# Patient Record
Sex: Male | Born: 1945 | Race: White | Hispanic: No | Smoking: Former smoker
Health system: Southern US, Community
[De-identification: ages and names within clinical notes are randomized; demographics above are authoritative.]

## PROBLEM LIST (undated history)

## (undated) DIAGNOSIS — I2109 ST elevation (STEMI) myocardial infarction involving other coronary artery of anterior wall: Secondary | ICD-10-CM

## (undated) DIAGNOSIS — T8859XA Other complications of anesthesia, initial encounter: Secondary | ICD-10-CM

## (undated) DIAGNOSIS — E785 Hyperlipidemia, unspecified: Secondary | ICD-10-CM

## (undated) DIAGNOSIS — R0602 Shortness of breath: Secondary | ICD-10-CM

## (undated) DIAGNOSIS — I2510 Atherosclerotic heart disease of native coronary artery without angina pectoris: Secondary | ICD-10-CM

## (undated) DIAGNOSIS — N182 Chronic kidney disease, stage 2 (mild): Secondary | ICD-10-CM

## (undated) DIAGNOSIS — G4734 Idiopathic sleep related nonobstructive alveolar hypoventilation: Secondary | ICD-10-CM

## (undated) DIAGNOSIS — F419 Anxiety disorder, unspecified: Secondary | ICD-10-CM

## (undated) DIAGNOSIS — I219 Acute myocardial infarction, unspecified: Secondary | ICD-10-CM

## (undated) DIAGNOSIS — I209 Angina pectoris, unspecified: Secondary | ICD-10-CM

## (undated) DIAGNOSIS — I639 Cerebral infarction, unspecified: Secondary | ICD-10-CM

## (undated) DIAGNOSIS — I213 ST elevation (STEMI) myocardial infarction of unspecified site: Secondary | ICD-10-CM

## (undated) DIAGNOSIS — D649 Anemia, unspecified: Secondary | ICD-10-CM

## (undated) DIAGNOSIS — J449 Chronic obstructive pulmonary disease, unspecified: Secondary | ICD-10-CM

## (undated) DIAGNOSIS — I5022 Chronic systolic (congestive) heart failure: Secondary | ICD-10-CM

## (undated) DIAGNOSIS — K279 Peptic ulcer, site unspecified, unspecified as acute or chronic, without hemorrhage or perforation: Secondary | ICD-10-CM

## (undated) DIAGNOSIS — C44310 Basal cell carcinoma of skin of unspecified parts of face: Secondary | ICD-10-CM

## (undated) DIAGNOSIS — I00 Rheumatic fever without heart involvement: Secondary | ICD-10-CM

## (undated) DIAGNOSIS — F329 Major depressive disorder, single episode, unspecified: Secondary | ICD-10-CM

## (undated) DIAGNOSIS — I4891 Unspecified atrial fibrillation: Secondary | ICD-10-CM

## (undated) DIAGNOSIS — I255 Ischemic cardiomyopathy: Secondary | ICD-10-CM

## (undated) DIAGNOSIS — T4145XA Adverse effect of unspecified anesthetic, initial encounter: Secondary | ICD-10-CM

## (undated) DIAGNOSIS — M1990 Unspecified osteoarthritis, unspecified site: Secondary | ICD-10-CM

## (undated) DIAGNOSIS — I739 Peripheral vascular disease, unspecified: Secondary | ICD-10-CM

## (undated) DIAGNOSIS — J189 Pneumonia, unspecified organism: Secondary | ICD-10-CM

## (undated) DIAGNOSIS — F32A Depression, unspecified: Secondary | ICD-10-CM

## (undated) DIAGNOSIS — I10 Essential (primary) hypertension: Secondary | ICD-10-CM

## (undated) DIAGNOSIS — G43909 Migraine, unspecified, not intractable, without status migrainosus: Secondary | ICD-10-CM

## (undated) DIAGNOSIS — I214 Non-ST elevation (NSTEMI) myocardial infarction: Secondary | ICD-10-CM

## (undated) DIAGNOSIS — I998 Other disorder of circulatory system: Secondary | ICD-10-CM

## (undated) HISTORY — DX: Peripheral vascular disease, unspecified: I73.9

## (undated) HISTORY — DX: Essential (primary) hypertension: I10

## (undated) HISTORY — DX: Depression, unspecified: F32.A

## (undated) HISTORY — DX: Acute myocardial infarction, unspecified: I21.9

## (undated) HISTORY — DX: Chronic kidney disease, stage 2 (mild): N18.2

## (undated) HISTORY — PX: APPENDECTOMY: SHX54

## (undated) HISTORY — DX: Other disorder of circulatory system: I99.8

## (undated) HISTORY — DX: Hyperlipidemia, unspecified: E78.5

## (undated) HISTORY — PX: FEMORAL-POPLITEAL BYPASS GRAFT: SHX937

## (undated) HISTORY — PX: LOWER EXTREMITY ANGIOGRAM: SHX5955

## (undated) HISTORY — PX: CATARACT EXTRACTION W/ INTRAOCULAR LENS  IMPLANT, BILATERAL: SHX1307

## (undated) HISTORY — DX: Ischemic cardiomyopathy: I25.5

## (undated) HISTORY — DX: Major depressive disorder, single episode, unspecified: F32.9

---
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
## 1969-06-08 HISTORY — PX: MANDIBLE RECONSTRUCTION: SHX431

## 1978-10-08 HISTORY — PX: WRIST FRACTURE SURGERY: SHX121

## 1987-10-09 DIAGNOSIS — I2109 ST elevation (STEMI) myocardial infarction involving other coronary artery of anterior wall: Secondary | ICD-10-CM

## 1987-10-09 HISTORY — DX: ST elevation (STEMI) myocardial infarction involving other coronary artery of anterior wall: I21.09

## 1996-10-08 HISTORY — PX: CORONARY ARTERY BYPASS GRAFT: SHX141

## 1998-01-18 ENCOUNTER — Other Ambulatory Visit: Admission: RE | Admit: 1998-01-18 | Discharge: 1998-01-18 | Payer: Self-pay | Admitting: Cardiology

## 1998-03-08 ENCOUNTER — Other Ambulatory Visit: Admission: RE | Admit: 1998-03-08 | Discharge: 1998-03-08 | Payer: Self-pay | Admitting: Cardiology

## 2003-05-02 ENCOUNTER — Inpatient Hospital Stay (HOSPITAL_COMMUNITY): Admission: EM | Admit: 2003-05-02 | Discharge: 2003-05-05 | Payer: Self-pay

## 2003-05-02 ENCOUNTER — Encounter: Payer: Self-pay | Admitting: Emergency Medicine

## 2003-06-02 ENCOUNTER — Ambulatory Visit (HOSPITAL_COMMUNITY): Admission: RE | Admit: 2003-06-02 | Discharge: 2003-06-02 | Payer: Self-pay | Admitting: Cardiology

## 2003-07-06 ENCOUNTER — Encounter: Payer: Self-pay | Admitting: Vascular Surgery

## 2003-07-08 ENCOUNTER — Ambulatory Visit (HOSPITAL_COMMUNITY): Admission: RE | Admit: 2003-07-08 | Discharge: 2003-07-09 | Payer: Self-pay | Admitting: Vascular Surgery

## 2007-10-09 DIAGNOSIS — I639 Cerebral infarction, unspecified: Secondary | ICD-10-CM

## 2007-10-09 DIAGNOSIS — I998 Other disorder of circulatory system: Secondary | ICD-10-CM

## 2007-10-09 HISTORY — DX: Cerebral infarction, unspecified: I63.9

## 2007-10-09 HISTORY — PX: OTHER SURGICAL HISTORY: SHX169

## 2007-10-09 HISTORY — DX: Other disorder of circulatory system: I99.8

## 2007-10-14 ENCOUNTER — Ambulatory Visit: Payer: Self-pay | Admitting: Vascular Surgery

## 2007-10-21 ENCOUNTER — Ambulatory Visit: Payer: Self-pay | Admitting: Surgery

## 2007-10-21 ENCOUNTER — Ambulatory Visit (HOSPITAL_COMMUNITY): Admission: RE | Admit: 2007-10-21 | Discharge: 2007-10-22 | Payer: Self-pay | Admitting: Surgery

## 2007-11-04 ENCOUNTER — Ambulatory Visit: Payer: Self-pay | Admitting: Vascular Surgery

## 2007-11-14 ENCOUNTER — Inpatient Hospital Stay (HOSPITAL_COMMUNITY): Admission: RE | Admit: 2007-11-14 | Discharge: 2007-11-17 | Payer: Self-pay | Admitting: Vascular Surgery

## 2007-11-14 ENCOUNTER — Encounter: Payer: Self-pay | Admitting: Vascular Surgery

## 2007-11-25 ENCOUNTER — Ambulatory Visit: Payer: Self-pay | Admitting: Vascular Surgery

## 2007-11-28 ENCOUNTER — Ambulatory Visit: Payer: Self-pay | Admitting: Vascular Surgery

## 2007-12-02 ENCOUNTER — Ambulatory Visit: Payer: Self-pay | Admitting: Vascular Surgery

## 2007-12-02 ENCOUNTER — Inpatient Hospital Stay (HOSPITAL_COMMUNITY): Admission: AD | Admit: 2007-12-02 | Discharge: 2007-12-18 | Payer: Self-pay | Admitting: Vascular Surgery

## 2007-12-29 ENCOUNTER — Ambulatory Visit: Payer: Self-pay | Admitting: Vascular Surgery

## 2008-01-26 ENCOUNTER — Ambulatory Visit: Payer: Self-pay | Admitting: Vascular Surgery

## 2008-03-03 ENCOUNTER — Ambulatory Visit: Payer: Self-pay | Admitting: Vascular Surgery

## 2008-03-09 ENCOUNTER — Ambulatory Visit: Payer: Self-pay | Admitting: Vascular Surgery

## 2008-06-08 HISTORY — PX: CAROTID ENDARTERECTOMY: SUR193

## 2008-06-29 ENCOUNTER — Ambulatory Visit: Payer: Self-pay | Admitting: Vascular Surgery

## 2008-07-05 ENCOUNTER — Ambulatory Visit: Payer: Self-pay | Admitting: Vascular Surgery

## 2008-07-07 ENCOUNTER — Encounter: Payer: Self-pay | Admitting: Vascular Surgery

## 2008-07-07 ENCOUNTER — Ambulatory Visit: Payer: Self-pay | Admitting: Vascular Surgery

## 2008-07-07 ENCOUNTER — Inpatient Hospital Stay (HOSPITAL_COMMUNITY): Admission: RE | Admit: 2008-07-07 | Discharge: 2008-07-08 | Payer: Self-pay | Admitting: Vascular Surgery

## 2008-07-27 ENCOUNTER — Ambulatory Visit: Payer: Self-pay | Admitting: Vascular Surgery

## 2008-12-14 ENCOUNTER — Encounter: Payer: Self-pay | Admitting: Cardiology

## 2008-12-14 HISTORY — PX: CARDIOVASCULAR STRESS TEST: SHX262

## 2009-06-21 ENCOUNTER — Ambulatory Visit: Payer: Self-pay | Admitting: Vascular Surgery

## 2009-10-08 DIAGNOSIS — C44310 Basal cell carcinoma of skin of unspecified parts of face: Secondary | ICD-10-CM

## 2009-10-08 HISTORY — DX: Basal cell carcinoma of skin of unspecified parts of face: C44.310

## 2009-12-25 IMAGING — CR DG CHEST 2V
2 series · 2 of 2 positions shown · non-contrast
Comparison: None.

CLINICAL DATA: preop evaluation for arteriography.

[view not recorded (1 of 2)]
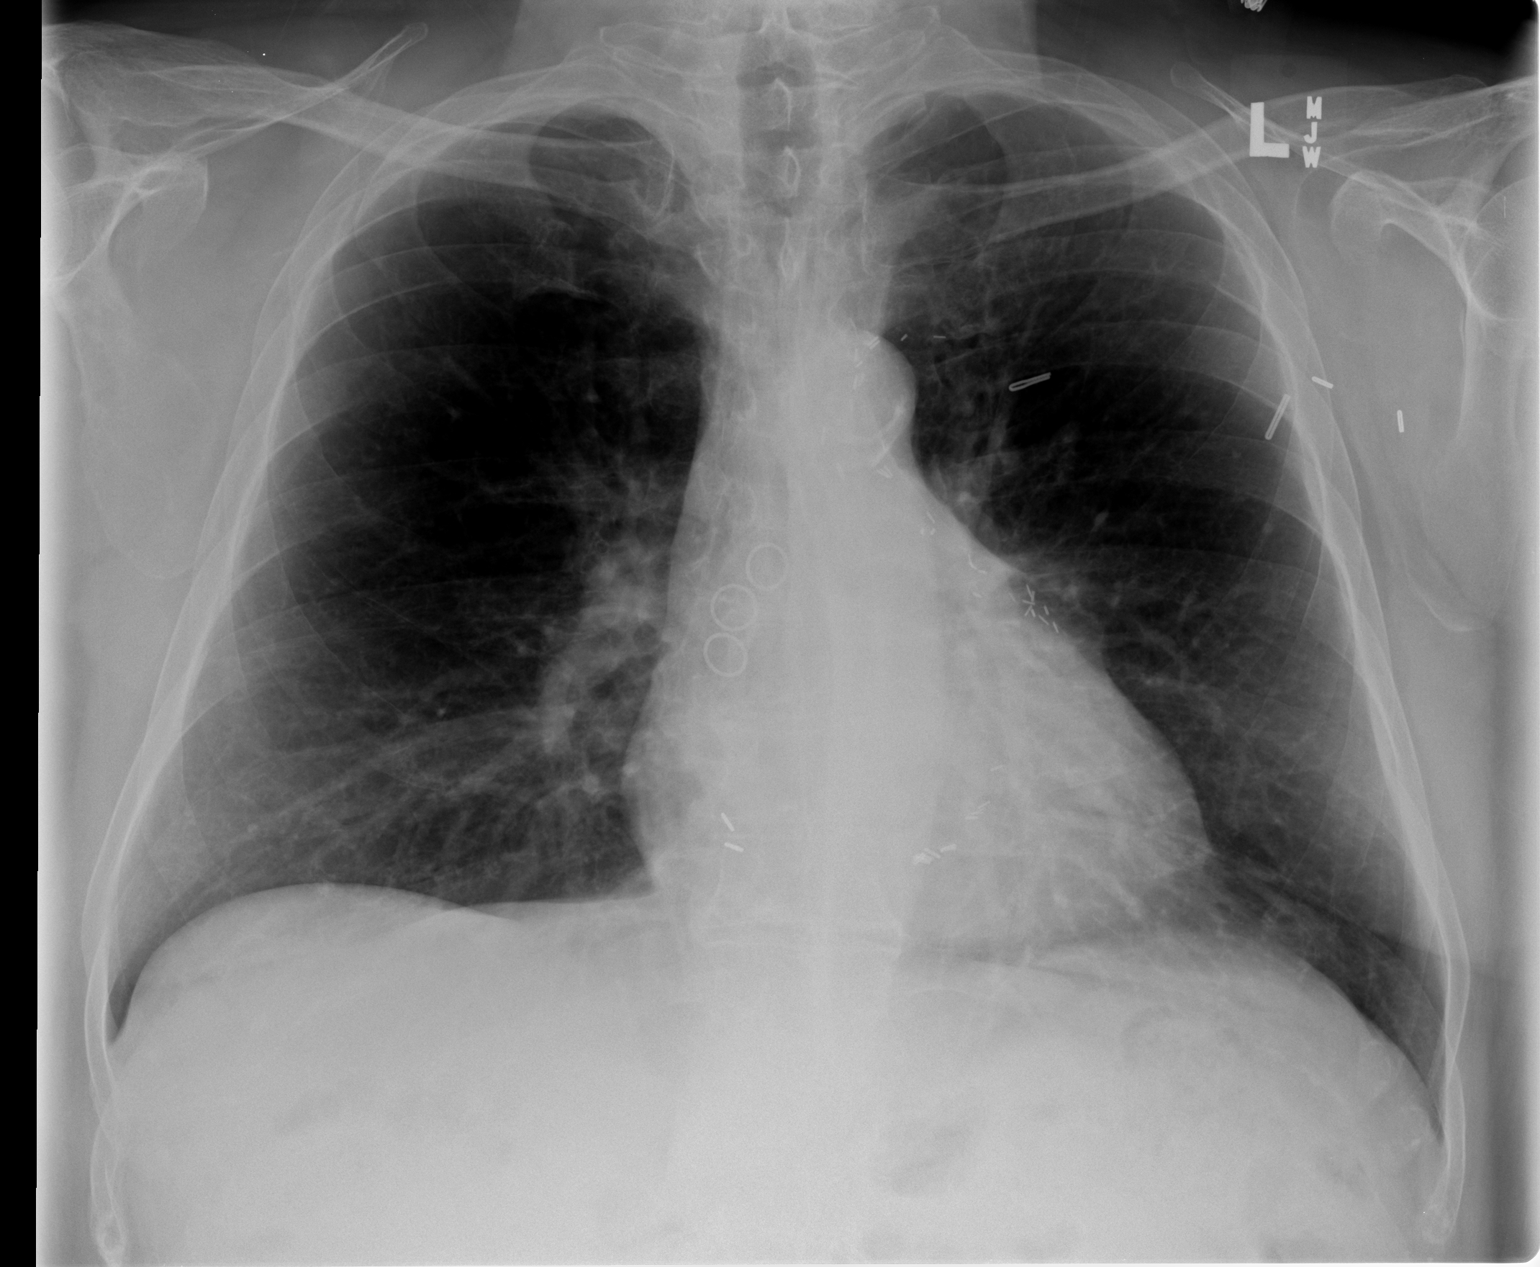

[view not recorded (2 of 2)]
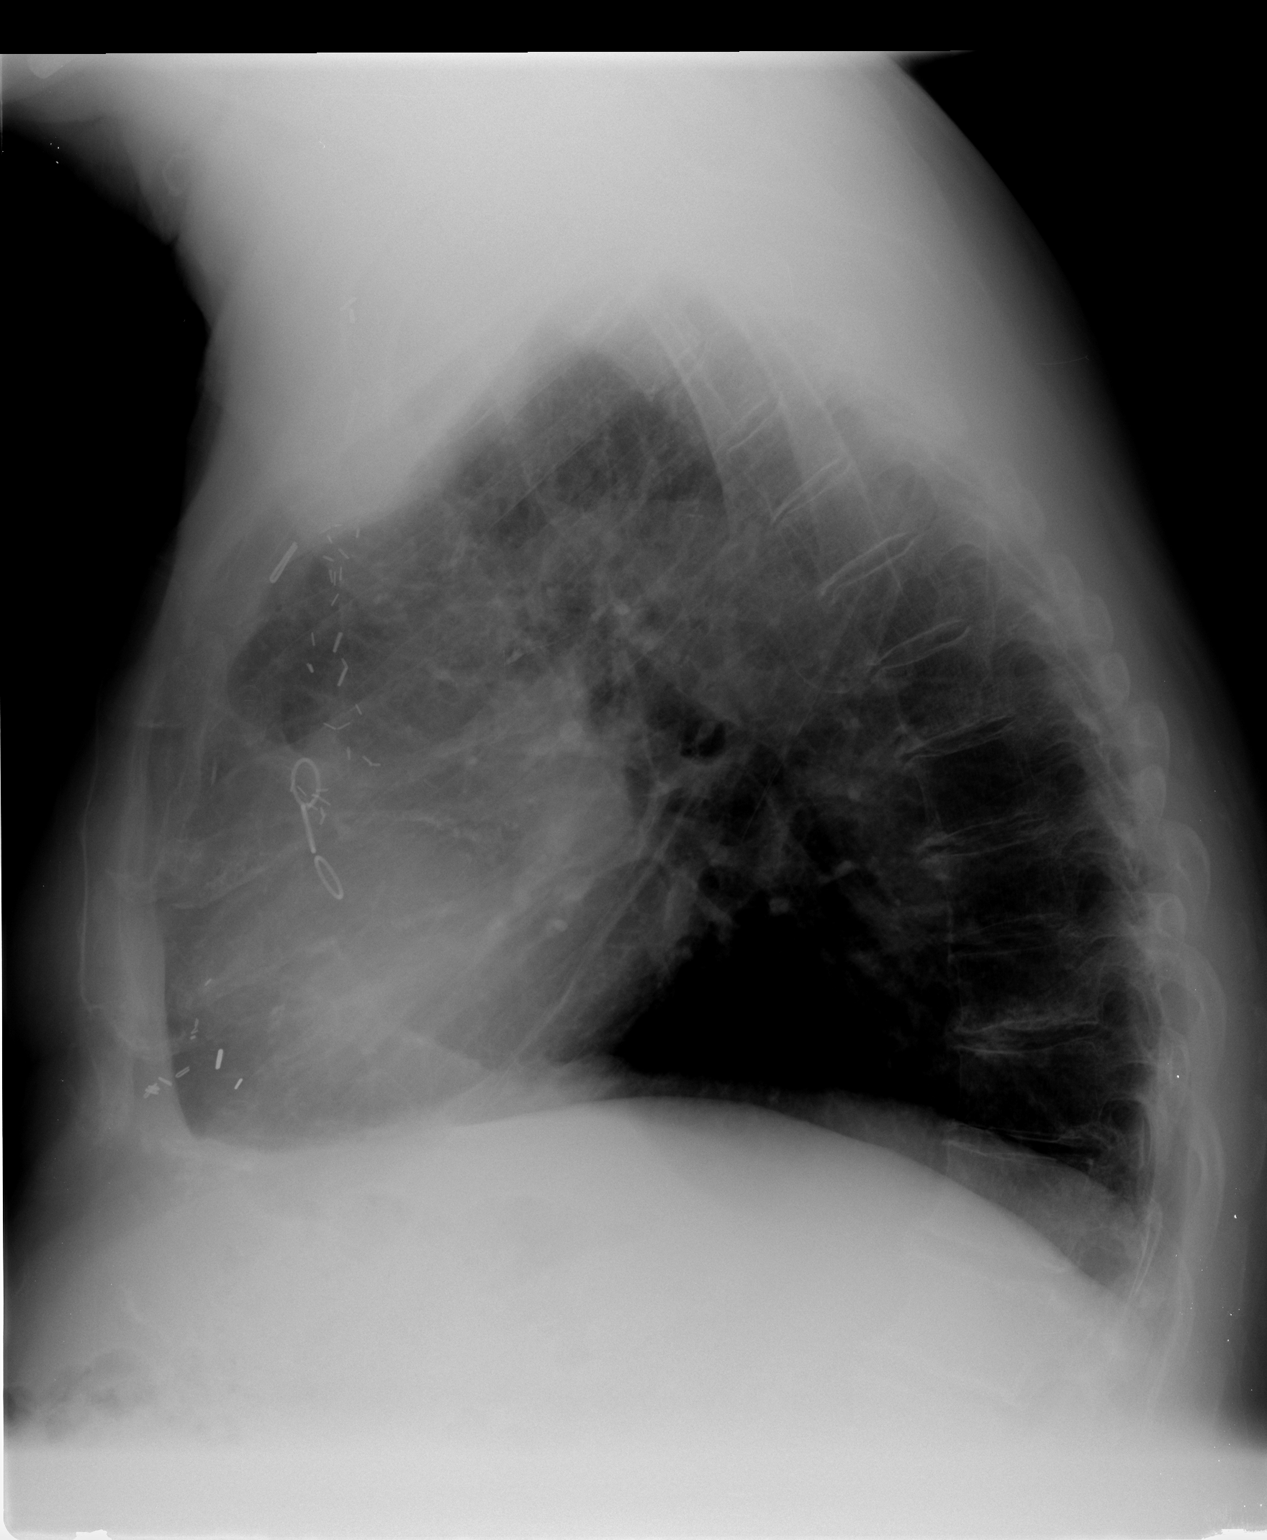

[2 of 2 positions shown; findings below may reference images not displayed]

CHEST - 2 VIEW:

Cardiopericardial silhouette is within normal limits for size. Patient status
post CABG. There is no focal airspace consolidation, pulmonary edema, or pleural
effusion. Bones are diffusely demineralized. Surgical clips are seen in the left
axilla.
IMPRESSION: No acute cardiopulmonary process

## 2010-06-05 ENCOUNTER — Ambulatory Visit: Payer: Self-pay | Admitting: Cardiology

## 2010-12-06 ENCOUNTER — Ambulatory Visit (INDEPENDENT_AMBULATORY_CARE_PROVIDER_SITE_OTHER): Payer: Medicare Other | Admitting: Cardiology

## 2010-12-06 DIAGNOSIS — F17200 Nicotine dependence, unspecified, uncomplicated: Secondary | ICD-10-CM

## 2010-12-06 DIAGNOSIS — I2510 Atherosclerotic heart disease of native coronary artery without angina pectoris: Secondary | ICD-10-CM

## 2011-02-15 ENCOUNTER — Other Ambulatory Visit: Payer: Self-pay | Admitting: Cardiology

## 2011-02-15 DIAGNOSIS — I10 Essential (primary) hypertension: Secondary | ICD-10-CM

## 2011-02-15 NOTE — Telephone Encounter (Signed)
Faxed refill for HCTZ 25mg . To [facility]

## 2011-02-20 NOTE — Assessment & Plan Note (Signed)
OFFICE VISIT   [name], [name]  DOB:  12-Apr-1946                                       01/26/2008  ZOXWR#:[id number]   Patient's right inguinal region is granulating in nicely.  The VAC was  removed.  There are some Granulex crystals, which they have been  applying to the surface of the wound.  The wound today measures 2.8 x 4  in its maximum dimensions, but it is filled in the depth of the wound  and is now a flat wound which just requires granulation tissue to  continue.   Will have moist-to-dry dressing changes at home.  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  Return to see me in six weeks unless he has any  evidence of infection in the interim.   Quita Skye Hart Rochester, M.[name].  Electronically Signed   JDL/MEDQ  [name]:  01/26/2008  T:  01/26/2008  Job:  [id number]

## 2011-02-20 NOTE — Assessment & Plan Note (Signed)
OFFICE VISIT   [name], [name]  DOB:  [birth year]                                       06/21/2009  NWGNF#:[id number]   The patient returns today now 1 year post right carotid endarterectomy.  He has had no neurologic symptoms since his surgery although he had a  remote history of amaurosis fugax in the right eye preoperatively.  He  has had no hemiparesis, aphasia, amaurosis fugax, diplopia, blurred  vision or syncope.  He has stable claudication in the left calf which is  not limiting him.  He has had no rest pain or history of infection or  nonhealing ulcers.  He is able to ambulate as much as he needs.  He  takes one aspirin per day.   PHYSICAL EXAM:  Blood pressure 180/77, heart rate 66, respirations 14.  His carotid pulses are 3+, soft bruit on the left, no bruit on the  right.  Neurological:  Normal.  Chest:  Clear to auscultation.  Abdomen:  Obese.  No palpable masses.  He has 3+ femoral and popliteal pulse on  the right and a 3+ femoral and absent distal pulses on the left.   ABI is 0.47 on the left, 1.0 on the right and his carotid duplex exam  today reveals no evidence of restenosis in the right internal carotid  and mild narrowing of the left internal carotid approximating 40-50%.  I  reassured him regarding these findings and will continue to follow him  on a carotid and fem-pop protocol in the future unless he develops any  new symptoms.   Quita Skye Hart Rochester, M.D.  Electronically Signed   JDL/MEDQ  D:  06/21/2009  T:  06/22/2009  Job:  [id number]

## 2011-02-20 NOTE — Procedures (Signed)
BYPASS GRAFT EVALUATION   INDICATION:  Followup evaluation of lower extremity bypass graft.  Left  leg claudication.   HISTORY:  Diabetes:  No.  Cardiac:  No.  Hypertension:  Yes.  Smoking:  Quit earlier this year.  Previous Surgery:  On 11/14/2007 the patient had a right external iliac,  common femoral artery, profundo-femoral artery endarterectomy with  Dacron patch angioplasty and a right fem-pop bypass graft with Gore-Tex  by Dr. Hart Rochester.   SINGLE LEVEL ARTERIAL EXAM                               RIGHT              LEFT  Brachial:                    174                173  Anterior tibial:             142                53  Posterior tibial:            149                32  Peroneal:  Ankle/brachial index:        0.86               0.30   PREVIOUS ABI:  Date:  10/14/2007 (preop)  RIGHT:  0.21  LEFT:  0.39   LOWER EXTREMITY BYPASS GRAFT DUPLEX EXAM:   DUPLEX:  Doppler arterial waveforms are triphasic proximal to, within  and distal to the right fem-pop bypass graft with no evidence of  stenosis in the graft.   IMPRESSION:  1. Right ABI is higher than previously recorded.  2. Left ABI is stable from previous study.  3. Patent right fem-pop bypass graft.   ___________________________________________  Quita Skye. Hart Rochester, M.D.   MC/MEDQ  D:  03/03/2008  T:  03/03/2008  Job:  [id number]

## 2011-02-20 NOTE — Op Note (Signed)
NAMEJORJE, VANATTA                ACCOUNT NO.:  [id number]   MEDICAL RECORD NO.:  [id number]          PATIENT TYPE:  AMB   LOCATION:  SDS                          FACILITY:  [facility]   PHYSICIAN:  VDurene Cal IV, MDDATE OF BIRTH:  08-11-1946   DATE OF PROCEDURE:  10/21/2007  DATE OF DISCHARGE:                               OPERATIVE REPORT   PREOPERATIVE DIAGNOSIS:  Bilateral severe claudication/rest pain, right  greater than left.   PROCEDURES PERFORMED:  1. Ultrasound-guided left common femoral artery access.  2. Abdominal aortogram.  3. Bilateral lower extremity runoff.   INDICATIONS:  This is a 65 year old gentleman with a history of  percutaneous intervention in the past who has had worsening of his  symptoms.  He comes in today for arteriogram.  Risks and benefits were  discussed.  Informed consent was signed.   The left common femoral artery was evaluated with ultrasound and found  to be widely patent.  A 1% lidocaine was used for local anesthesia.  Using ultrasound, the left common femoral artery was accessed with an 18-  gauge needle.  A 0.035 wire was advanced in a retrograde fashion to the  abdominal aorta under fluoroscopic visualization.  A 5-French sheath was  then placed.  An OmniFlush catheter was advanced over the wire, placed  at the level of L1 and an abdominal aortogram was obtained.  Next, the  catheter was pulled down to the aortic bifurcation and pelvic angiogram  was obtained followed by bilateral lower extremity runoff.   FINDINGS:  Aortogram:  The visualized portions of the suprarenal  abdominal aorta showed minimal disease.  There are 2 renal arteries  bilaterally with no evidence of renal artery stenosis.  The infrarenal  abdominal aorta is heavily calcified.  Stents were seen within the right  and left common iliac artery at the origin and found to be widely  patent.  The left external iliac artery is widely patent.  The right  common and  external iliac arteries are widely patent.  The left common  and external iliac artery are widely patent.   Right lower extremity:  There is diffuse disease within the common  femoral artery with an occlusion at its distal extent.  There is diffuse  disease within the right superficial femoral artery with total  occlusion.  There is reconstitution above the popliteal artery, which is  heavily calcified.  There is a high-grade stenosis in the popliteal  artery behind the knee.  The popliteal artery is patent, and there is a  3-vessel runoff down to the ankle.   Left lower extremity:  The left common femoral artery is heavily  diseased.  There is occlusion of the profunda femoral artery at its  origin.  This reconstitutes via collaterals.  The left superficial  femoral is occluded with reconstitution at the adductor canal.  The  popliteal artery is patent.  There is 3-vessel runoff.   After the above images were obtained, a decision was made to terminate  the procedure.  The patient tolerated the procedure well.  There were no  complications.  IMPRESSION:  1. Widely patent kissing iliac stents.  2. Widely patent left external iliac stent.  No evidence of inflow      disease.  3. Diffuse right common femoral disease with occlusion at its distal      extent.  There is reconstitution of the      superficial femoral artery above the knee with an area of stenosis      at the level of the patella.  4. Occlusion of the left profunda femoral artery and diffuse disease      within the left common femoral artery.  There is also left      superficial femoral artery occlusion with reconstitution at the      adductor canal.           ______________________________  V. Charlena Cross, MD  Electronically Signed     VWB/MEDQ  D:  10/21/2007  T:  10/21/2007  Job:  [id number]

## 2011-02-20 NOTE — H&P (Signed)
HISTORY AND PHYSICAL EXAMINATION   December 02, 2007   Re:  [name], [name]                DOB:  06-18-1946   CHIEF COMPLAINT:  Nonhealing right groin wound post right lower  extremity revascularization.   HISTORY OF PRESENT ILLNESS:  This patient underwent extensive right  femoral endarterectomy and right femoral popliteal bypass grafting with  6 mm Gore-Tex by Dr. Hart Rochester on February 6th of this year for a severely  ischemic right leg.  He had diffuse severe calcific disease throughout  his right femoral vessels and ended up with a Dacron patch angioplasty  and profunda femoris endarterectomy as well as his femoral popliteal  graft.  He developed some skin necrosis and has been followed in the  office for the last week, being seen on three occasions with some  superficial debridement being performed today and some subcutaneous fat  necrosis debrided.  It was decided to admit him for local wound care and  IV antibiotics.   PAST MEDICAL HISTORY:  1. Hypertension.  2. Coronary artery disease post coronary artery bypass grafting by Dr.      Donata Clay in 1988.  3. Myocardial infarction, 1989.  4. Negative for COPD, stroke or diabetes.   PREVIOUS SURGERY:  1. Coronary artery bypass grafting.  2. Parasternal wound infection.  3. Ligation AV fistula right profunda femoris artery and vein with      suture repair by Dr. Hart Rochester 1989.  4. Right femoral endarterectomy with Dacron patch.  5. Right femoral popliteal bypass grafting by Dr. Hart Rochester this year in      February, 2009.   SOCIAL HISTORY:  1. Patient is single.  2. Has 3 children.  3. Is retired.  4. Smokes a pack of cigarettes per day.  5. Drinks a moderate amount of alcohol.   ALLERGIES:  None known.   MEDICATIONS:  1. Hydrochlorothiazide 25 mg one daily.  2. Enalapril 20 mg one daily.  3. Metoprolol 100 mg one daily.  4. Amlodipine 10 mg one daily.  5. Simvastatin 20 mg one daily.  6. Citalopram 20  mg one daily.  7. Aspirin one tablet daily.   PHYSICAL EXAM:  Blood pressure is 118/64, heart rate 60, respirations  14.  General:  This is a healthy-appearing male in no apparent distress,  alert and oriented x3.  Neck:  Supple, 3+ carotid pulses are palpable.  No bruits are audible.  Neurologic Exam:  Normal.  No palpable  adenopathy in the neck.  Chest:  Clear to auscultation.  Cardiovascular  Exam:  This is a regular rhythm, no murmurs.  Abdomen:  Soft, nontender  with no masses.  Extremity Exam:  Reveals the right groin wound to be  debrided and open, about 2 cm in depth with some fat necrosis.  The deep  layered closure is still intact and there is no evidence of any purulent  drainage from the depth of the wound.  There is some mild erythema  surrounding this.  Right leg has a 3+ popliteal graft pulse and well  perfused right foot.  Left leg has 3+ femoral pulse, no distal pulses.   IMPRESSION:  Nonhealing wound right groin from recent right lower  extremity revascularization.   PLAN:  Admit the patient for local wound care and IV antibiotics.   Quita Skye Hart Rochester, M.D.  Electronically Signed   JDL/MEDQ  D:  12/02/2007  T:  12/04/2007  Job:  [id number] 

## 2011-02-20 NOTE — Procedures (Signed)
BYPASS GRAFT EVALUATION   INDICATION:  Follow up right fem-pop bypass graft.   HISTORY:  Diabetes:  No.  Cardiac:  Yes, CABG.  Hypertension:  Yes.  Smoking:  Yes.  Previous Surgery:  Please see above.   SINGLE LEVEL ARTERIAL EXAM                               RIGHT              LEFT  Brachial:                    149                146  Anterior tibial:             150                42  Posterior tibial:            140                63  Peroneal:  Ankle/brachial index:        1.01               0.42   PREVIOUS ABI:  Date: 03/03/08  RIGHT:  0.86  LEFT:  0.30   LOWER EXTREMITY BYPASS GRAFT DUPLEX EXAM:   DUPLEX:  Patent right fem-pop bypass graft with no evidence of focal  stenosis.   IMPRESSION:  1. Patent right femoropopliteal bypass graft with no evidence of focal      stenosis.  2. Normal ankle brachial index with biphasic Doppler waveform noted in      the right leg, status post right femoropopliteal bypass graft.  3. Moderately abnormal ankle brachial index with monophasic Doppler      waveform noted in the left leg.   ___________________________________________  Quita Skye. Hart Rochester, M.D.   MG/MEDQ  D:  06/29/2008  T:  06/29/2008  Job:  [id number]

## 2011-02-20 NOTE — H&P (Signed)
HISTORY AND PHYSICAL EXAMINATION   November 04, 2007   Re:  [name], [name].               DOB:  12-22-1945   CHIEF COMPLAINT:  Severe bilateral lower extremity claudication right  worse than left secondary to common femoral and femoral popliteal  occlusive disease.   HISTORY OF PRESENT ILLNESS:  This patient was referred for evaluation of  lower extremity occlusive disease by Dr. Delfin Edis having previously  undergone bilateral iliac interventions by Dr. Hart Rochester in 2004 which  included bilateral common iliac artery stenting proximally as well as a  distal left common iliac artery stent.  He had a good result initially  and was noted at that time to have severe calcific disease involving his  superficial femoral arteries.  Recently he has developed severe  worsening of his symptoms with essentially rest pain in the right leg  and is unable to walk more than about 50-75 feet at which time both legs  are quite symptomatic.  His right leg is certainly worse.  He has no  history of infection, gangrene or nonhealing ulcers.  He did have one  episode of amaurosis fugax in the right eye 3 years ago but has not had  any recurrence.  Angiography revealed his aortoiliac system to be patent  with his stents being widely patent but severe common femoral occlusive  disease bilaterally as well as superficial femoral occlusions and he is  now being evaluated for a femoral popliteal bypass graft initially on  the right side.   PAST MEDICAL HISTORY:  1. Hypertension.  2. Coronary artery disease status post coronary artery bypass grafting      Dr. Donata Clay in 1988.  3. Myocardial infarction 1989.  4. Negative for COPD, stroke or diabetes.   PAST SURGICAL HISTORY:  1. Coronary artery bypass grafting.  2. Repair of sternal wound infection.  3. Ligation of an AV fistula of the right profunda femoris artery and      vein with primary suture repair by Dr. Hart Rochester 1989.   SOCIAL  HISTORY:  The patient is single, has 3 children and is retired.  Smokes a pack of cigarettes per day and drinks moderate amount of  alcohol.   ALLERGIES:  None known.   MEDICATIONS:  1. Hydrochlorothiazide 25 mg one daily.  2. Enalapril 20 mg one daily.  3. Metoprolol 100 mg one daily.  4. Amlodipine 10 mg one daily.  5. Simvastatin 20 mg one daily.  6. Citalopram 20 mg one daily.  7. Potassium 99 mEq one daily.  8. Aspirin one tablet one daily.   PHYSICAL EXAMINATION:  Vital signs:  Blood pressure is 150/80, heart  rate 65, respirations 14.  General:  He is a healthy-appearing male in  no apparent distress.  Alert and oriented x3.  Neck:  Supple.  3+  carotid pulses.  There is a soft bruit on the right.  Neurological:  Normal.  No palpable adenopathy in the neck.  Chest:  Clear to  auscultation.  Cardiovascular:  Exam reveals a regular rhythm with no  murmurs.  Abdomen:  Soft, nontender with no masses.  Extremities:  Exam  reveals the right leg to have a 2+ femoral pulse, absent distal pulses.  Left leg has a 1-2+ femoral pulse, absent distal pulses.  Both feet have  some dependent rubor with no active ulceration or infection.  ABI is  0.21 on the right and 0.39 on the  left.   I reviewed his angiogram done by Dr. Myra Gianotti which reveals his  aortoiliac system to be patent although diffusely diseased.  There is  total occlusion of his right common femoral artery and right superficial  femoral artery with reconstitution of popliteal artery distal leg with a  below knee popliteal artery on the right being relatively good.  On the  left leg there is occlusion of the profunda femoris artery and the  superficial femoral artery with distal reconstitution of the above knee  popliteal artery.   IMPRESSION:  Rest pain right leg secondary to severe lower extremity  occlusive disease.   PLAN:  Admit the patient on February 6 for right femoral endarterectomy,  right femoral popliteal  bypass grafting with Gore-Tex since saphenous  vein has been removed in the past.  Dr. Delfin Edis has recently  evaluated the patient and feels that his cardiac status is quite stable  and adequate at this time for lower extremity revascularization.   Quita Skye Hart Rochester, M.D.  Electronically Signed   JDL/MEDQ  D:  11/04/2007  T:  11/05/2007  Job:  [id number]   cc:   Colleen Can. Deborah Chalk, M.D.

## 2011-02-20 NOTE — Assessment & Plan Note (Signed)
OFFICE VISIT   VAHE, PIENTA  DOB:  February 04, 1946                                       03/09/2008  ZOXWR#:[id number]   The patient is now almost 4 months post extensive right external iliac,  common femoral endarterectomy with Dacron patch angioplasty in the right  femoral below knee popliteal bypass of 6 mm Gore-Tex graft.  The right  inguinal wound has completely healed with no evidence of any residual  fat necrosis or infection.  He has no symptoms of ischemia in the right  leg with no claudication.  He continues to have severe claudication  symptoms in the left calf which limit him to walking less than 1 block.  He has no rest pain or history of nonhealing ulcers on the left side.  He denies any hemispheric or nonhemispheric TIAs, amaurosis fugax,  diplopia, blurred vision or syncope.  He also is having no chest pain,  dyspnea on exertion, PND or orthopnea.   PHYSICAL EXAMINATION:  Vital signs:  On exam today blood pressure  144/70, heart rate 64.  Carotid pulses 3+ with a soft bruit on the left.  Neurological:  Normal.  Chest:  Clear to auscultation.  Abdomen:  Soft,  nontender with no masses.  He has 3+ femoral, popliteal and posterior  tibial and dorsalis pedis pulse in the right foot with a well-perfused  right foot which has 1+ edema.  Left leg has 2+ femoral pulse with no  distal pulses.   He does have extensive common femoral and profunda disease on the left  and a superficial femoral occlusion and needs a similar procedure on the  left side in the near future.  ABIs last week were 0.30 on the left and  0.86 on the right.  He would like to wait until the fall if at all  possible so he will return in 3 months with the following studies:  1)  carotid duplex exam which has not been done in 11 years, 2) ABIs, 3)  vein mapping left greater saphenous vein in preparation for left femoral  popliteal bypass graft.   Quita Skye Hart Rochester, M.D.  Electronically Signed   JDL/MEDQ  D:  03/09/2008  T:  03/10/2008  Job:  [id number]

## 2011-02-20 NOTE — Op Note (Signed)
NAMELEXTON, HIDALGO NO.:  [id number]   MEDICAL RECORD NO.:  [id number]          PATIENT TYPE:  INP   LOCATION:  3304                         FACILITY:  [facility]   PHYSICIAN:  Quita Skye. Hart Rochester, M.D.  DATE OF BIRTH:  Jul 28, 1946   DATE OF PROCEDURE:  11/14/2007  DATE OF DISCHARGE:                               OPERATIVE REPORT   PREOPERATIVE DIAGNOSIS:  Severely ischemic right leg secondary to severe  iliac, common femoral, superficial femoral and tibial occlusive disease.   POSTOPERATIVE DIAGNOSIS:  Severely ischemic right leg secondary to  severe iliac, common femoral, superficial femoral and tibial occlusive  disease.   OPERATION:  1. Extensive endarterectomy of right external iliac, common femoral      and profunda femoris arteries with Dacron patch angioplasty.  2. Right common femoral to popliteal (below-knee) bypass using a 6-mm      Gore-Tex (Propaten) bypass.  3. Intraoperative arteriogram.   SURGEON:  Quita Skye. Hart Rochester, M.D.   FIRST ASSISTANT:  Jerold Coombe, P.A.   ANESTHESIA:  General endotracheal.   PROCEDURE:  The patient was taken to the operating room and placed in  supine position, at which time satisfactory general endotracheal  anesthesia was administered.  Right leg was prepped with Betadine scrub  and solution, draped in a routine sterile manner.  A longitudinal  incision was made in the inguinal area, carried down through  subcutaneous tissue to the femoral vessels, which were severely and  diffusely calcified.  The common superficial and profunda femoris artery  were dissected free.  Superficial femoral artery was chronically  occluded.  The profunda had previously had a traumatic AV fistula  repaired and therefore had a lot of scar tissue and was calcified  heavily down to the tertiary branches, which were dissected free,  exposing the profunda for a distance of about 6-7 cm.  The common  femoral artery was diffusely calcified and  pulseless and the external  iliac was exposed well up beneath the inguinal ligament as far as  possible, where it did have eventually a pulse but was diffusely  diseased.  The popliteal artery was exposed through a medial incision  below the knee, where it was also a diseased vessel but did have a soft  area and was patent on angiogram.  A subfascial anatomic tunnel was  created and a 6-mm ringed Gore-Tex-Propaten graft was delivered through  the tunnel.  The patient was heparinized.  The popliteal artery was  occluded proximally and distally with vessel loops, opened with a 15  blade, extended with the Potts scissors.  It was a diffusely-diseased  vessel but was widely patent and had good backbleeding.  The Gore-Tex  was spatulated and anastomosed end-to-side with 6-0 Prolene.  Clamps  were then released.  Attention turned to the inguinal area, where the  external iliac artery was occluded as far proximally as possible with a  Henley clamp, profunda was occluded distally, and a longitudinal opening  made in the common femoral artery through this area of severely,  diffusely calcified disease.  The arteriotomy was extended well  up into  the external iliac artery and down through the common femoral into the  profunda for a distance of 5 or 6 cm down the profunda until it was  patent but did still have a posterior plaque.  A long endarterectomy was  then performed to remove this calcified plaque and it was carried out  proximally up to the clamp and there was good inflow present.  After  careful removal of all debris the superficial femoral artery was excised  from its origin, leaving a long area to patch, which was done with a  Dacron Finesse patch.  This was sewn on with 5-0 Prolene.  A site for  anastomosis of the Gore-Tex to the patch was selected . It was opened  with a 15 blade, extended with the Potts scissors, and the Gore-Tex  anastomosed end-to-side with 6-0 Prolene.  Clamps were  then released and  there was an excellent pulse and Doppler flow, not only in the profunda  but also in the below-knee popliteal artery and posterior tibial and  anterior tibial arteries at the ankle.  Intraoperative arteriogram  revealed a widely patent anastomosis to the below-knee popliteal artery  with three-vessel runoff.  Protamine was given to reverse the heparin.  Following adequate hemostasis, the wounds were irrigated with saline,  closed in layers with Vicryl in subcuticular fashion, sterile dressing  applied.  The patient taken to the recovery room in satisfactory  condition.      Quita Skye Hart Rochester, M.D.  Electronically Signed     JDL/MEDQ  D:  11/14/2007  T:  11/15/2007  Job:  [id number]

## 2011-02-20 NOTE — Discharge Summary (Signed)
[name], Dominic NO.:  [id number]   MEDICAL RECORD NO.:  [id number]          PATIENT TYPE:  INP   LOCATION:  2550                         FACILITY:  [facility]   PHYSICIAN:  Quita Skye. Hart Rochester, M.D.  DATE OF BIRTH:  [birth year]   DATE OF ADMISSION:  07/07/2008  DATE OF DISCHARGE:  07/08/2008                               DISCHARGE SUMMARY   DISCHARGE DIAGNOSES:  1. Right carotid occlusive disease.  2. Hypertension.  3. Coronary artery disease, status post coronary artery bypass      grafting.  4. Status post myocardial infarction, 1989.   PROCEDURES PERFORMED:  1. Resection of redundant right common carotid with primary      reanastomosis.  2. Right carotid endarterectomy utilizing 10-French shunt and Dacron      patch angioplasty closure both procedures by Dr. Hart Rochester.   COMPLICATIONS:  None.   DISCHARGE MEDICATIONS:  He is instructed to resume all preoperative  medications consisting of.  1. Hydrochlorothiazide 25 mg p.o. daily.  2. Citalopram 20 mg p.o. daily.  3. Enalapril 20 mg p.o. b.i.d.  4. Simvastatin 20 mg p.o. daily.  5. Aspirin 325 mg p.o. daily.  6. Potassium 99 mg p.o. q.a.m.  7. Norvasc 10 mg p.o. daily.  8. Metoprolol 100 mg p.o. daily.  9. He is given a prescription for Percocet 5/325 one p.o. q.4 h.      p.r.n. pain #20 were given.   DISPOSITION:  He is being discharged home in stable condition with his  wounds healing well.  He is given careful instructions regarding the  care of his wounds and his activity level.  He is to see Dr. Hart Rochester in 2  weeks.  Our office will make the appointment.   CONDITION AT DISCHARGE:  Stable improving.   BRIEF IDENTIFYING STATEMENT:  For complete details, please refer the  typed history and physical.  Briefly, this very pleasant 65 year old  gentleman was referred to Dr. Hart Rochester for right carotid occlusive  disease.  He did experience an episode consistent with amaurosis fugax  approximately 3 years  ago, which resolved.  He experienced no further  symptoms.  Dr. Hart Rochester recommended right carotid endarterectomy for  stroke prevention.  He was informed of the risks and benefits of the  procedure and after careful consideration, elected to proceed with  surgery.   HOSPITAL COURSE:  Preoperative workup was completed as an outpatient.  He was brought in through same-day surgery and underwent the  aforementioned right carotid endarterectomy.  For complete details,  please refer the typed  operative report.  The procedure was without complications.  He was  returned to the postanesthesia care unit extubated.  The following  morning, he was neurologically intact.  His tongue was in midline.  His  smile was symmetrical.  He was desirous of discharge and was discharged  home in stable condition.      [name], PA      Quita Skye Hart Rochester, M.D.  Electronically Signed    KEL/MEDQ  D:  07/08/2008  T:  07/08/2008  Job:  [id number]

## 2011-02-20 NOTE — Op Note (Signed)
NAMEDOSS, CYBULSKI NO.:  [id number]   MEDICAL RECORD NO.:  [id number]          PATIENT TYPE:  INP   LOCATION:  2550                         FACILITY:  [facility]   PHYSICIAN:  Quita Skye. Hart Rochester, M.D.  DATE OF BIRTH:  [birth year]   DATE OF PROCEDURE:  07/07/2008  DATE OF DISCHARGE:                               OPERATIVE REPORT   PREOPERATIVE DIAGNOSIS:  Severe right internal carotid stenosis with  remote history of right amaurosis fugax.   POSTOPERATIVE DIAGNOSIS:  Severe right internal carotid stenosis with  remote history of right amaurosis fugax.   OPERATIONS:  1. Right carotid endarterectomy with Dacron patch angioplasty.  2. Resection of redundant carotid artery with primary reanastomosis of      right common carotid artery.   SURGEON:  Durell Lofaso. Hart Rochester, MD   FIRST ASSISTANT:  [name], PA   ANESTHESIA:  General endotracheal.   BRIEF HISTORY:  This patient has been followed with moderate right  carotid occlusive disease and is now progressed to a severe state  greater than 80% in severity.  He also reports that he had an episode of  loss of vision in the right eye lasting 30 minutes 2-3 years ago.  He  has had no recurrent symptoms.  He also has a moderate left carotid  stenosis.  Scheduled for right carotid endarterectomy.   PROCEDURE:  The patient was taken to the operating room, placed in the  supine position at which time satisfactory general endotracheal  anesthesia was administered.  The right neck was prepped with Betadine  scrub and solution and draped in a routine sterile manner.  Incision was  made along the anterior border of the sternocleidomastoid muscle and  carried down through subcutaneous tissue and platysma using the Bovie.  Common facial vein and external jugular veins were ligated with 3-0 silk  ties and divided exposing the common, internal, and external carotid  arteries.  Care was taken not to injure the vagus or hypoglossal  nerves  both of which were exposed.  There was a calcified plaque at the carotid  bifurcation which was somewhat inverted with the internal carotid artery  posteriorly and this was all mobilized appropriately.  There was  significant redundancy and kinking of the internal carotid artery which  was a pulseless vessel and this was all dissected free and clearly would  require resection to straighten this vessel.  Following this, a #10  shunt was prepared and the patient was heparinized.  The vessels were  occluded with vascular clamps.  Longitudinal opening made in the common  carotid with a 15 blade and extended up the internal carotid with Potts  scissors to a point distal to the disease.  The plaque was a cauliflower  type plaque with at least 90% stenosis in severity.  A #10 shunt was  inserted without difficulty reestablishing flow in about 2 minutes.  Standard endarterectomy was then performed using the elevator and the  Potts scissors with eversion endarterectomy of the external carotid.  Plaque feathered off the distal internal carotid artery nicely not  requiring any tacking sutures.  Lumen was thoroughly cleaned of all  loose debris and irrigated and a 2.5 to 3-cm section of common carotid  artery was then resected in order to straighten the internal carotid.  This was done between marking sutures of 6-0 Prolene and after this was  resected, a primary reanastomosis was done with a continuous 6-0 Prolene  suture.  Following this, Dacron patch was sewn into place and the shunt  removed.  Following antegrade and retrograde flushing, closure was  completed, reestablishing flow initially up the external and up the  internal branch.  Protamine was then given to reverse the heparin.  There was excellent  Doppler flow on all vessels.  Adequate hemostasis was achieved.  The  wound was closed in layers with Vicryl in a subcuticular fashion.  Sterile dressing applied.  The patient taken to  recovery room in  satisfactory condition.      Quita Skye Hart Rochester, M.D.  Electronically Signed     JDL/MEDQ  D:  07/07/2008  T:  07/08/2008  Job:  [id number]

## 2011-02-20 NOTE — Discharge Summary (Signed)
NAMEAMERICUS, PERKEY NO.:  [id number]   MEDICAL RECORD NO.:  [id number]          PATIENT TYPE:  INP   LOCATION:  2029                         FACILITY:  [facility]   PHYSICIAN:  Quita Skye. Hart Rochester, M.D.  DATE OF BIRTH:  January 23, 1946   DATE OF ADMISSION:  11/14/2007  DATE OF DISCHARGE:  11/17/2007                               DISCHARGE SUMMARY   ADMISSION DIAGNOSIS:  Severe ischemic right leg secondary to severe  iliac, femoral, superficial femoral, and tibial occlusive disease.   DISCHARGE/SECONDARY DIAGNOSES:  1. Severe ischemic right leg secondary to severe iliac, femoral,      superficial femoral, and tibial occlusive disease, status post      femoral endarterectomy and right femoral popliteal artery bypass      grafting.  2. Hypertension.  3. Coronary artery disease, status post coronary artery bypass      grafting by Dr. Donata Clay in 1988.  4. Myocardial infarction, 1989.  5. History of repair of sternal wound for infection.  6. 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 Hart Rochester in 1989.  7. History of bilateral common iliac artery stenting proximally as      well as a distal left common iliac artery stent by Dr. Hart Rochester in      2004.   No known drug allergies.   PROCEDURES:  1. November 14, 2007, extensive endarterectomy of right external iliac,      common femoral, and profunda femoris arteries with Dacron patch      angioplasty.  2. Right common femoral to popliteal below knee artery bypass using 6-      mm Gore-Tex (propaten bypass) and intraoperative arteriogram by Dr.      Josephina Gip.  3. Postoperative ABIs (preliminary report, ABI 0.56 on the right      compared to 0.21 and 0.35 on the left compared to 0.39).   INPATIENT CONSULTS:  Cardiac rehab.   BRIEF HISTORY:  Mr. Rodenberg is a 65 year old male referred to Dr. Hart Rochester  for evaluation of lower extremity occlusive disease by  Dr. Roger Shelter.  He has previously undergone bilateral iliac interventions by  Dr. Hart Rochester in 2004 which included bilateral common iliac artery stenting  proximally as well as a distal left common iliac artery stent.  He had  good results initially and was noted at that time to have severe  calcific disease involving the superficial femoral arteries.  Recently  he developed severe worsening of his symptoms with essentially rest pain  on the right leg and was unable to walk about 50-75 feet before  experiencing symptoms.  His right leg symptoms were worse on the left  and no history of infection, gangrene, or non-healing ulcers.  He  apparently had an episode of amaurosis fugax in the right eye 3 years  ago but has not had any reoccurrence.  Angiography revealed his  aortoiliac system to be patent with his stents being widely patent but  severe common femoral occlusive disease bilaterally as well  as  superficial femoral occlusions and Dr. Hart Rochester recommended that he should  undergo right femoral endarterectomy and right femoral popliteal artery  bypass grafting with plans that his left leg may also need to be done in  the near future as well.   HOSPITAL COURSE:  Mr. Sanderfer was electively admitted to [facility] on November 14, 2007.  He underwent the previously mentioned  procedure.  Postoperatively, he had an uneventful hospital course.  He  remained on the stepdown unit on 3300 for the first 24 hours but then  felt appropriate transfer to telemetry unit on 2000, where he remained  until discharge.  He remained hemodynamically stable.  He did develop  some 1 plus edema in his right leg which was treated by elevation.  He  had good Doppler signals of his right posterior tibial and dorsalis  pedis arteries.  Incisions healing well without signs of infection.  He  tolerated an oral diet.  He was able to ambulate in the hallways with  minimal assistance and gait was noted to be  steady by cardiac rehab.  ABIs showed improvement on the right as noted above.   POSTOPERATIVE LABORATORY:  Showed a white count of 10.1, hematocrit 36,  hemoglobin 12.4, platelet count 167.  Sodium 130, potassium 3.5, BUN 3,  creatinine 0.77, blood glucose 126.   VITAL SIGNS:  Most recent vitals showed blood pressure 113/68, heart  rate in the 70s.  He is afebrile.  Oxygen saturation 93% on room air.   On November 17, 2007, Mr. Bartok was felt to have met appropriate  criteria for discharge home.  He was discharged home in stable  condition.   DISCHARGE MEDICATIONS:  1. Percocet 5/325 mg one to two tablets p.o. q.4 h. p.r.n. pain.  2. Hydrochlorothiazide 25 mg p.o. daily.  3. Enalapril 20 mg daily.  4. Metoprolol 100 mg p.o. daily.  5. Amlodipine 10 mg daily.  6. Simvastatin 20 mg daily.  7. Citalopram 20 mg daily.  8. Aspirin 325 mg daily.   DISCHARGE INSTRUCTIONS:  1. Continue heart healthy diet.  2. Increase activity slowly.  3. May shower and clean his incision gently with soap and water.  4. He should call if he develops fever greater than 101, redness or      drainage from his incision sites or increased pain.  5. Avoid driving or heavy lifting for the next 2-3 weeks until      comfortable off narcotic pain medication.  6. He will see Dr. Hart Rochester in the vascular vein specialists office in      approximately 2-3 weeks with postoperative ABIs.  Our office will      contact him regarding specific appointment date and time.      Jerold Coombe, P.A.      Quita Skye Hart Rochester, M.D.  Electronically Signed    AWZ/MEDQ  D:  11/17/2007  T:  11/17/2007  Job:  [id number]   cc:   Colleen Can. Deborah Chalk, M.D.

## 2011-02-20 NOTE — Assessment & Plan Note (Signed)
OFFICE VISIT   [name], Dominic  DOB:  1946-09-05                                       11/28/2007  FAOZH#:[id number]   [name] is in today for continued followup of his right external  iliac femoral endarterectomy with Dacron patch angioplasty and right  common femoral to below-knee popliteal bypass with Gore-Tex.  He had  seen Dr. Hart Rochester 3 days ago with drainage and erythema of this area.  He  has seen me today for continued followup.  He reports that the  discomfort is somewhat improved since his initiation of Keflex.  He  continues to have erythema around the groin and also the popliteal  incision.  He has an area approximately 1 x 5 cm of full thickness skin  loss at the groin incision.  This is not fluctuant, there is no  drainage.  He will continue his antibiotic treatment and keeping this  area covered and will see Dr. Hart Rochester as planned on February 24 in our  office.   Larina Earthly, M.D.  Electronically Signed   TFE/MEDQ  D:  11/28/2007  T:  11/30/2007  Job:  [id number]

## 2011-02-20 NOTE — Consult Note (Signed)
NEW PATIENT CONSULTATION   [name], [name]  DOB:  August 01, 1946                                       10/14/2007  QIONG#:[id number]   HISTORY:  The patient was referred for consultation by Dr. Colleen Can.  [place] for lower extremity occlusive disease.  He has a history of  previous lower extremity intervention by me in 2004.  At that time he  had stenting of his left distal common iliac artery and both common  iliac arteries proximally for severe aortoiliac occlusive disease and  also was noted to have bilateral superficial femoral occlusions.  At  that time his symptoms were dramatically improved in the buttock and  thigh region but he continued to have stable calf claudication symptoms.  He has not been seen in our office since 2004.  He now presents with  increasing discomfort and pain in the right leg, some of which is at  rest.  He has severe claudication, being only able to ambulate about 50-  75 feet with both legs being involved with the right worse than the  left.  He states the entire right leg is symptomatic.  He has no history  of nonhealing ulcers, infection or gangrene.  He did have an episode of  amaurosis fugax in the right eye 3 years ago where he describes a shade  like closure of the vision in his right eye for 15 minutes which then  resolved and he has had no further hemispheric or nonhemispheric TIAs  since that time.   PAST MEDICAL HISTORY:  1. Hypertension.  2. Coronary artery disease with previous coronary artery bypass      grafting by Dr. Kathlee Nations Trigt in 1998 with myocardial infarction      in 1989.  3. Negative for COPD.   PAST SURGICAL HISTORY:  1. Coronary artery bypass grafting.  2. Repair of a sternal wound infection.  3. Ligation of an AV fistula in the right profunda femoris artery and      vein with primary suture repair in 1989.   SOCIAL HISTORY:  The patient is single, has 3 children, is retired,  smokes a pack of  cigarettes per day and drinks moderate amount of  alcohol.   REVIEW OF SYSTEMS AND MEDICATIONS:  Please see health history form.   ALLERGIES:  None known.   PHYSICAL EXAMINATION:  Vital signs:  Blood pressure 140/77, heart rate  65, respirations 16.  General:  He is in no apparent distress.  He is  alert and oriented x3.  Neck:  His carotid pulses are 3+ with soft bruit  on the right.  Neurologic:  Exam is normal.  No palpable adenopathy in  the neck.  Chest:  Clear to auscultation.  Cardiovascular:  Exam reveals  a regular rhythm with no murmurs.  Abdomen:  Soft, nontender, with no  masses.  Extremities:  Exam reveals the right leg to have 2+ femoral  pulse and absent distal pulses.  The left leg has a 1+ femoral pulse and  absent distal pulses.  Both feet have dependent rubor with no active  ulceration or infection.   Lower extremity arterial Dopplers performed at [facility] on October 14, 2007,  reveals an ABI of 0.21 on the right and 0.39 on the left.   IMPRESSION:  Rest pain right lower extremity secondary to  severe  aortoiliac, femoral, popliteal and tibial occlusive disease.   PLAN:  To schedule angiography by Dr. Juleen China on January 13 to  see if any interventions can be performed in the iliac system to improve  inflow and then proceed with distal bypass grafting or whatever would be  indicated at that time depending on the anatomy.   Quita Skye Hart Rochester, M.[name].  Electronically Signed   JDL/MEDQ  [name]:  10/14/2007  T:  10/15/2007  Job:  [id number]

## 2011-02-20 NOTE — H&P (Signed)
HISTORY AND PHYSICAL EXAMINATION   July 05, 2008   Re:  GRISELDA, TOSH                 DOB:  Apr 27, 1946   CHIEF COMPLAINT:  Severe right internal carotid stenosis with remote  history of amaurosis fugax, right eye, currently asymptomatic.   HISTORY OF PRESENT ILLNESS:  This 65 year old male patient has a long  history of vascular occlusive disease, most recently underwent extensive  femoral endarterectomy and right fem-pop bypass graft by Dr. Hart Rochester in  February of this year for severely ischemic right leg.  He has done well  from that standpoint, but was found to have carotid bruits and duplex  scan was performed, which revealed a 95% right internal carotid stenosis  and 70% left internal carotid stenosis.  The patient describes an  episode of 30 minutes of visual loss in the right eye occurring 3 years  ago, which almost completely blacked out his vision and then resolved.  He has had no recurrence of symptoms or other hemispheric TIAs,  diplopia, blurred vision, or syncope.  He is now scheduled for an  elective right carotid endarterectomy.   PAST MEDICAL HISTORY:  1. Hypertension.  2. Coronary artery disease post coronary artery bypass grafting by Dr.      Donata Clay in 1988.  3. Myocardial infarction in 1989, currently asymptomatic, followed      closely by Dr. Delfin Edis.  4. Negative for COPD, stroke, or diabetes.   PREVIOUS SURGERY:  1. Coronary artery bypass grafting.  2. Parasternal wound infection.  3. Ligation of AV fistula right profunda femoris artery and vein with      suture repair by Dr. Hart Rochester in 1989.  4. Right femoral endarterectomy with Dacron patch.  5. Right femoral popliteal bypass grafting this year in February of      2009.   SOCIAL HISTORY:  1. The patient is single.  2. He has 3 children.  3. He is retired.  4. He smokes a pack of cigarettes per day and has done so most of his      life, and drinks a moderate amount of  alcohol.   ALLERGIES:  No known.   MEDICATIONS:  1. Hydrochlorothiazide 25 mg 1 daily.  2. Enalapril 20 mg 1 daily.  3. Metoprolol 100 mg 1 daily.  4. Amlodipine 10 mg 1 daily.  5. Simvastatin 20 mg 1 daily.  6. Citalopram 20 mg 1 daily.  7. Aspirin 1 tablet daily.   PHYSICAL EXAM:  Blood pressure 147/83, heart rate is 71, respirations  14.  Generally, he is a male patient who is in no apparent distress.  Alert and oriented x3.  His neck is supple with 3+ carotid pulses.  There is a very high-pitched bruit on the right side with a softer bruit  on the left.  His neurologic exam is normal.  Chest is clear to  auscultation.  Cardiovascular exam reveals a regular rhythm with no  murmurs.  His abdomen is soft, nontender, no palpable masses.  Right  extremity has 3+ femoral, 2+ popliteal pulse, well-perfused right foot.  Left leg has 3+ femoral pulse and absent distal pulses.  Both feet are  adequately perfused.   IMPRESSION:  Severe right internal carotid stenosis with moderate left  internal carotid stenosis with symptoms of amaurosis fugax in the right  eye 3 years ago.  No current symptoms.   PLAN:  To admit the patient on September  30 for an elective right  carotid endarterectomy.  Risks and benefits have been thoroughly  discussed with the patient and he would like to proceed.  We will follow  the left side with periodic duplex scanning.   Quita Skye Hart Rochester, M.D.  Electronically Signed   JDL/MEDQ  D:  07/05/2008  T:  07/06/2008  Job:  [id number]   cc:   Colleen Can. Deborah Chalk, M.D.

## 2011-02-20 NOTE — Assessment & Plan Note (Signed)
OFFICE VISIT   [name], [name]  DOB:  [birth year]                                       12/29/2007  EAVWU#:[id number]   Patient returns with a wound vac over his right inguinal incision which  developed fat necrosis and breakdown following his extensive right  femoral endarterectomy and femoral-popliteal bypass graft.  He has been  having the wound vac changed three times per week by home health, and  the wound is granulating nicely.  The central tunnel, which had gone  down to an area near the graft is closing, and the graft is certainly  not visible, and the wound is cleaned.  He does have 1-2+ edema in the  right leg, which is aggravating his chronic eczema in the right ankle.  He has a 3+ dorsalis pedis pulse palpable, and the below-knee incision  has healed nicely.  He has had no chills or fever.   Blood pressure 152/76.  Heart rate is 75.   He will continue to elevate the leg frequently and will have three times  per week wound vac changes and return to see me in three weeks unless  there are any changes.   Quita Skye Hart Rochester, M.D.  Electronically Signed   JDL/MEDQ  D:  12/29/2007  T:  12/29/2007  Job:  [id number]

## 2011-02-20 NOTE — Assessment & Plan Note (Signed)
OFFICE VISIT   [name], [name]  DOB:  January 31, 1946                                       07/27/2008  UVOZD#:[id number]   The patient is 3 weeks post right carotid endarterectomy for a severe  right internal carotid stenosis with remote history of amaurosis fugax  in the right eye.  He has had no neurologic symptoms since his surgery  including amaurosis fugax, hemiparesis, aphasia, etc.  He is swallowing  well, has no hoarseness and is taking one aspirin per day.  His left leg  claudication symptoms seem to have improved.  He states that he is now  able to get along fairly well unless he has to climb stairs, at which  time he has left calf tightness.  There are no right calf claudication  symptoms since his right fem-pop bypass graft.  He does anatomically  have occlusion of his left profunda and superficial femoral arteries  with a patent popliteal artery and is a good candidate for left femoral  popliteal grafting if necessary.  Most recent ABIs 0.42 on the left and  1.0 on the right.   PHYSICAL EXAMINATION:  Vital signs:  On exam his blood pressure is  150/81, heart rate 72, respirations 14.  Neck:  His carotid pulses are  3+.  Right neck incision is healing nicely.  No bruits are audible.  Neurological:  Normal.  He has 3+ femoral popliteal pulse on the right  and a 3+ femoral pulse on the left.   He is generally stable and I think we will see him in 6 months with  followup carotid duplex exam and ABIs at that time unless his left calf  symptoms worsen at which time he will be in touch with [place].   Quita Skye Hart Rochester, M.D.  Electronically Signed   JDL/MEDQ  D:  07/27/2008  T:  07/28/2008  Job:  [id number]

## 2011-02-20 NOTE — Procedures (Signed)
BYPASS GRAFT EVALUATION   INDICATION:  Follow up right lower extremity bypass graft, stable left  lower extremity claudication.   HISTORY:  Diabetes:  No.  Cardiac:  CABG.  Hypertension:  Yes.  Smoking:  Yes.  Previous Surgery:  Right femoral-popliteal artery bypass graft  11/14/2007 by Dr. Hart Rochester.   SINGLE LEVEL ARTERIAL EXAM                               RIGHT              LEFT  Brachial:                    179                188  Anterior tibial:             171                75  Posterior tibial:            185                89  Peroneal:  Ankle/brachial index:        0.98               0.47   PREVIOUS ABI:  Date:  06/29/2008  RIGHT:  1.01  LEFT:  0.42   LOWER EXTREMITY BYPASS GRAFT DUPLEX EXAM:   DUPLEX:  Doppler arterial waveforms appear biphasic proximal to, within  and distal to right lower extremity bypass graft.   IMPRESSION:  1. Patent right femoral-popliteal artery bypass graft.  2. Bilateral ankle brachial indices appear stable.  3. No significant changes from previous study.         ___________________________________________  Quita Skye Hart Rochester, M.D.   AS/MEDQ  D:  06/21/2009  T:  06/22/2009  Job:  [id number]

## 2011-02-20 NOTE — Assessment & Plan Note (Signed)
OFFICE VISIT   [name], [name]  DOB:  12/28/1945                                       11/25/2007  ZOXWR#:[id number]   Patient underwent an extensive right femoral endarterectomy with  profundoplasty as well as a right femoral-to-below-knee popliteal bypass  graft with Gore-Tex on February 6th by me.  He had a severe rest pain in  the right foot and has no saphenous vein in the right leg.  He returns  today with some skin necrosis in the right femoral incision and mild  erythema surrounding this.  He has had no chills or fever and has had  some scant bloody drainage.  His right leg is 2+ swollen throughout.  I  have encouraged him to elevate the leg more effectively, particularly at  night.  He does have a palpable posterior tibial pulse in the right foot  and is warm and well perfused.  Does not appear to have any fluctuans or  purulent drainage from this area.  There is very mild erythema  surrounding it.  We will place him on Keflex 500 mg q.i.d. x10 days,  have him elevate the leg maximally.  I gave him a refill for pain  medication, Tylox #40, and he will return on Friday to have Dr. Arbie Cookey  check this wound, and then will return next Tuesday for me to check the  wound.  He does have Gore-Tex beneath this, but I do believe we can  manage this as an outpatient if it does not worsen.   Quita Skye Hart Rochester, M.D.  Electronically Signed   JDL/MEDQ  D:  11/25/2007  T:  11/27/2007  Job:  [id number]

## 2011-02-20 NOTE — Procedures (Signed)
VASCULAR LAB EXAM   INDICATION:  Preop.   HISTORY:  Diabetes:  No.  Cardiac:  CABG.  Hypertension:  Yes.   EXAM:  Duplex of the left greater saphenous vein.   IMPRESSION:  The left greater saphenous vein appeared to be of normal  size and caliber for bypass graft.   ___________________________________________  Quita Skye. Hart Rochester, M.D.   MG/MEDQ  D:  06/29/2008  T:  06/29/2008  Job:  [id number]

## 2011-02-20 NOTE — Discharge Summary (Signed)
[name], [name] NO.:  [id number]   MEDICAL RECORD NO.:  [id number]            PATIENT TYPE:   LOCATION:                                 FACILITY:   PHYSICIAN:  Quita Skye. Hart Rochester, M.D.       DATE OF BIRTH:   DATE OF ADMISSION:  DATE OF DISCHARGE:  12/18/2007                               DISCHARGE SUMMARY   PRINCIPAL DIAGNOSIS:  Status post right femoral endarterectomy with thin  popliteal bypass graft utilizing Gore-Tex, November 14, 2007, now with  skin necrosis.   PROCEDURES PERFORMED:  Antibiotic wound care.   DISCHARGE MEDICATIONS:  1. Keflex 500 mg 1 p.o. q.8 hours.  2. Hydrochlorothiazide 25 mg p.o. daily.  3. Enalapril 20 mg p.o. daily.  4. Metoprolol 100 mg p.o. daily.  5. Amlodipine 10 mg p.o. daily.  6. Simvastatin 20 mg p.o. daily.  7. Citalopram 20 mg p.o. daily.  8. Aspirin 81 mg p.o. daily.   DISPOSITION:  He has been discharged home with a wound VAC in place.  He  is given a followup to see Dr. Hart Rochester either March 23rd or 24th prior to  the wound VAC change.   BRIEF IDENTIFYING STATEMENT WITH COMPLETE DETAILS:  Please refer to the  admit note.   Briefly, [name] is a very pleasant 65 year old gentleman who  underwent a right femoral popliteal bypass graft utilizing Cortex  material on November 14, 2007.  He did develop skin breakdown in  his  groin.  He has been followed in the office by Dr. Hart Rochester.  On December 02, 2007, he felt that he should admit him for further treatment.   HOSPITAL COURSE:  He was admitted to bed on a convalescent floor.  He  was placed on IV antibiotics.  His wound was further debrided and  dressing changes were instituted.  His antibiotics and wound changes  continued.  Over the  next week, his wound appeared to improve.  Granulation tissue was forming.  We have elected to change  him over to a wound VAC on December 08, 2006.  The VAC has to be changed  tomorrow and.  If the wound continues to appear stable  and healing, he  will be discharged home.  At this time, it is important to note that  graft material has not been appreciated within the wound.      Dominic Arms, PA      Quita Skye Hart Rochester, M.D.  Electronically Signed    KEL/MEDQ  D:  12/17/2007  T:  12/17/2007  Job:  [id number]

## 2011-02-20 NOTE — Procedures (Signed)
CAROTID DUPLEX EXAM   INDICATION:  Follow up carotid artery disease.   HISTORY:  Diabetes:  no  Cardiac:  CABG  Hypertension:  yes  Smoking:  yes  Previous Surgery:  Right CEA with DPA and resection of redundant carotid  artery 07/07/2008 by Dr. Hart Rochester  CV History:  Amaurosis fugax several years ago, asymptomatic now.  Amaurosis Fugax No, Paresthesias No, Hemiparesis No                                       RIGHT             LEFT  Brachial systolic pressure:         179               188  Brachial Doppler waveforms:         WNL               WNL  Vertebral direction of flow:        antegrade         antegrade  DUPLEX VELOCITIES (cm/sec)  CCA peak systolic                   73                80  ECA peak systolic                   103               181  ICA peak systolic                   93                120  ICA end diastolic                   22                28  PLAQUE MORPHOLOGY:                  none              calcific  PLAQUE AMOUNT:                      none              mild  PLAQUE LOCATION:                    non               ICA, ECA, CCA   IMPRESSION:  1. Right internal carotid artery shows no evidence of restenosis,      status post carotid endarterectomy.  2. Left internal carotid artery shows evidence of 40% to 59% stenosis      (low end of range), decrease in velocity from preoperative study.   ___________________________________________  Quita Skye Hart Rochester, M.D.   AS/MEDQ  D:  06/21/2009  T:  06/22/2009  Job:  [id number]

## 2011-02-20 NOTE — Procedures (Signed)
CAROTID DUPLEX EXAM   INDICATION:  Follow up known peripheral vascular disease.   HISTORY:  Diabetes:  No.  Cardiac:  CABG.  Hypertension:  Yes.  Smoking:  No.  Previous Surgery:  Right fem-pop bypass graft.  CV History:  Amaurosis fugax about three years ago.  Amaurosis Fugax About 3 years ago, Paresthesias No, Hemiparesis No.                                       RIGHT             LEFT  Brachial systolic pressure:         149               146  Brachial Doppler waveforms:         Biphasic          Biphasic  Vertebral direction of flow:        Antegrade         Antegrade  DUPLEX VELOCITIES (cm/sec)  CCA peak systolic                   50                142  ECA peak systolic                   Not well visualized                 181  ICA peak systolic                   568               181  ICA end diastolic                   306               76  PLAQUE MORPHOLOGY:                  Calcified         Heterogenous  PLAQUE AMOUNT:                      Severe            Moderate  PLAQUE LOCATION:                    ICA, ECA          ICA, ECA   IMPRESSION:  1. 80-99% stenosis noted in the right internal carotid artery, 40-59%      stenosis noted in the left internal carotid artery.  2. Antegrade bilateral vertebral arteries.   ___________________________________________  Quita Skye Hart Rochester, M.D.   MG/MEDQ  D:  06/29/2008  T:  06/29/2008  Job:  [id number]

## 2011-02-23 NOTE — Cardiovascular Report (Signed)
NAMELEW, PROUT NO.:  [id number]   MEDICAL RECORD NO.:  [id number]                   PATIENT TYPE:  INP   LOCATION:  2016                                 FACILITY:  [facility]   PHYSICIAN:  Colleen Can. Deborah Chalk, M.D.            DATE OF BIRTH:  [birth year]   DATE OF PROCEDURE:  05/04/2003  DATE OF DISCHARGE:                              CARDIAC CATHETERIZATION   HISTORY:  The patient is a 65 year old male referred for evaluation chest  pain.  He has had extensive cardiovascular disease dating back to 1989 but  he had bypass grafting in 1998 and has done relatively well since that time.  He presents with recurrent chest pain.  He has had a mediastinitis post  bypass surgery in 1998.   PROCEDURE:  Left heart catheterization with selective coronary angiography,  left ventricular angiography, saphenous vein graft angiography x3, and  angiography of the left internal mammary artery.   TYPE AND SITE OF ENTRY:  Percutaneous, right femoral artery.   CATHETERS:  6-French 4 curved Judkins right and left coronary catheters; 6-  French pigtail ventriculographic catheter; 6-French internal mammary graft  catheter.   CONTRAST MATERIAL:  Omnipaque.   MEDICATIONS GIVEN PRIOR TO THE PROCEDURE:  Valium 10 mg p.o.   MEDICATIONS GIVEN DURING THE PROCEDURE:  Versed 2 mg IV, fentanyl 25 mcg IV.   COMMENTS:  The patient tolerated the procedure well.   HEMODYNAMIC DATA:  1. The aortic pressure was 146/85.  2. LV was 151/6-11.  3. There was no aortic valve gradient noted on pullback.   ANGIOGRAPHIC DATA:  Left ventricular angiogram was performed in the RAO  position.  Left ventricular function was essentially normal.  There was a  global ejection fraction of 50-55%.  There was short and mild inferobasilar  hypokinesia.   CORONARY ARTERIES:  1. 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  2. Left main coronary artery has mild distal  narrowing.  3. Left anterior descending:  The left anterior descending has a 70% ostial     narrowing.  Approximately 2 cm after a large septal perforating branch     after a very small proximal diagonal the left anterior descending is     occluded.  4. Left circumflex:  The left circumflex is totally occluded 3-4 cm from its     origin.  5. Saphenous vein graft to posterior descending is widely patent with a nice     insertion and good distal runoff.  There is some hang-up of dye in the     mid portion of the graft but basically that portion is smooth and it does     clear, although it is slightly delayed.  6. Saphenous vein graft to acute marginal.  Vessel is appropriately small.     It has a nice insertion and good distal runoff.  7. Saphenous vein graft to  the obtuse marginal:  There is a valve in the mid     portion of the body of the graft.  The insertion is nice and satisfactory     with excellent flow into the obtuse marginal.  It does not appear that     the vein graft is diseased.  8. Left internal mammary graft to the LAD is patent with excellent flow.  It     is somewhat tortuous toward the insertion site and there is a tortuous     segment at the insertion site that is evaluated in multiple views and is     felt to be satisfactory.   OVERALL IMPRESSION:  1. Well preserved left ventricular function with very minimal inferobasilar     hypokinesia.  2. Totally occluded native circulation.  3. Persistent patency of saphenous vein grafts x3 and persistent patency of     the left internal mammary graft to the left anterior descending.   DISCUSSION:  In light of these findings it is felt that we can manage the  patient medically.  He will be encouraged to modify risk factors.                                               Colleen Can. Deborah Chalk, M.D.    SNT/MEDQ  D:  05/04/2003  T:  05/04/2003  Job:  [id number]

## 2011-02-23 NOTE — Discharge Summary (Signed)
[name], [name] NO.:  [id number]   MEDICAL RECORD NO.:  [id number]                   PATIENT TYPE:  INP   LOCATION:  2016                                 FACILITY:  [facility]   PHYSICIAN:  Colleen Can. Deborah Chalk, M.D.            DATE OF BIRTH:  06/27/1946   DATE OF ADMISSION:  05/02/2003  DATE OF DISCHARGE:  05/05/2003                                 DISCHARGE SUMMARY   PRIMARY DISCHARGE DIAGNOSIS:  Atypical chest pain with subsequent elective  cardiac catheterization revealing well preserved left ventricular function  with very minimal inferobasal hypokinesis, totally occluded native  circulation, persistent patency of vein grafts x3, as well as persistent  patency of the left internal mammary graft to the LAD. It is felt that in  light of these findings we can manage the patient medically.   SECONDARY DIAGNOSES:  1. Ongoing tobacco abuse.  2. Hypertension.  3. Extensive atherosclerotic cardiovascular disease dating back to 1989 with     subsequent bypass surgery in 1998 that was complicated by mediastinitis.   HISTORY OF PRESENT ILLNESS:  The patient is a 65 year old white man who has  multiple medical problems who presented with a three-day history of chest  tightness that was associated significant anxiety. He was subsequently seen  and evaluated by Dr. Candace Cruise and referred for admission.   Please see dictated history and physical for further patient presentation  and profile.   LABORATORY DATA:  On admission, his cardiac enzymes were negative. CBC was  normal. Chemistries were satisfactory except for a potassium of 3.2.   Chest x-ray showed cardiomegaly and mild to moderate vascular congestion,  and there were no overt CHF findings.   EKG showed no acute changes.   HOSPITAL COURSE:  The patient was admitted. He ruled out negative for  myocardial infarction. We proceeded on with cardiac catheterization on May 04, 2003. The procedure  was tolerated well. The results are as noted above.  In light of these findings, it is felt that he is best to continue with  medical management as well as modification of his cardiovascular risk  factors.   On May 05, 2003, he is doing well without complaints. Physical exam is  unremarkable. Groin is stable, and he is felt to be a stable candidate for  discharge today.   DISCHARGE MEDICATIONS:  1. Lopressor 100 mg daily.  2. Enalapril 20 b.i.d.  3. Hydrochlorothiazide 25 mg daily.  4. Aspirin daily.  5. Zocor 20 q.d.  6. Norvasc 10 q.d.  7. Will start Lexapro 10 mg a day as well.   ACTIVITY:  Activity is to be light for the next few days.   DIET:  Low fat.   FOLLOW UP:  I have encouraged him to stop smoking. We will see him back in  the office in two to three weeks. He is asked to call to schedule that  appointment or certainly sooner if problems arise.      [name] C. [name], N.P.                 Colleen Can. Deborah Chalk, M.D.    LCO/MEDQ  D:  05/05/2003  T:  05/05/2003  Job:  [id number]

## 2011-02-23 NOTE — H&P (Signed)
NAME:  [name], [name] NO.:  [id number]   MEDICAL RECORD NO.:  [id number]                   PATIENT TYPE:  OIB   LOCATION:  NA                                   FACILITY:  [facility]   PHYSICIAN:  Quita Skye. Hart Rochester, M.D.               DATE OF BIRTH:  02-20-1946   DATE OF ADMISSION:  DATE OF DISCHARGE:                                  OERATIVE REPORT   PREOPERATIVE DIAGNOSIS:  Bilateral lower extremity claudication secondary to  aorto-iliac femoral, popliteal, and tibial occlusive disease.   PROCEDURE:  1. Abdominal aortogram with bilateral lower extremity runoff via left common     femoral approach.  2. Selective catheterization right common femoral artery.  3. Bilateral proximal common iliac PTA and stenting with an 8x29 Genesis on     Opta at 10 atmospheres for 45 seconds on the right and an 8x24 Genesis on     Opta at 10 atmospheres for 45 seconds on the left for the proximal iliac     lesions plus second angioplasty on the right with a 9x2 Powerflex balloon     at 8 atmospheres for 60 seconds and on the left an 8x25 balloon at 8     atmospheres for 60 seconds.  4. PTA and stenting of left distal common iliac artery stenosis using an     8x18 Genesis on Opta at 10 atmospheres for 45 seconds.   SURGEON:  Salih Williamson. Hart Rochester, M.D.   ANESTHESIA:  Local Xylocaine, Versed 2 mg intravenously.   COMPLICATIONS:  None.   CONTRAST:  255 cc.   DESCRIPTION OF PROCEDURE:  The patient was taken to the [facility]  peripheral endovascular lab and placed in the supine position at which time  both groins were prepped with Betadine scrub and solution, draped in routine  sterile manner. After infiltration of 1% Xylocaine the left common femoral  artery was entered percutaneously. A guide wire passed into the suprarenal  aorta under fluoroscopic guidance. A 5-French sheath and dilator were passed  over the guide wire. The dilator removed and the standard pigtail  catheter  positioned in the suprarenal aorta. Flush abdominal aortograms were  performed injecting 20 cc of contrast at 20 cc per second. This revealed the  aorta to be widely patent with two renal arteries bilaterally, all of which  seemed to be widely patent. There was mild infrarenal occlusive disease with  no significant stenosis in the aorta. At  the bifurcation there was an  approximate 80% proximal right common iliac stenosis extending down about 2  cm. On the left side there was mild narrowing, but plaque formation. On the  left side there was a total occlusion of the left internal iliac artery and  about an 80% focal stenosis at the very distal left common iliac artery. The  distal iliac arteries were widely patent. On the right side  the superficial  femoral artery was diffusely diseased and occluded in the mid thigh with  reconstitution above the knee and three vessel runoff with tibial disease  being present. On the left side there was mild diffuse superficial femoral  occlusive disease with two vessel runoff through the anterior tibial and  peroneal arteries, the posterior tibial being occluded and reconstituting at  the ankle. After performing oblique views of the iliac arteries both LAO and  RAO and the lower extremity runoff using 88 cc of contrast at 8 cc per  second it was felt that the right common iliac stenosis and the left distal  common iliac lesions were the most significant and causing the buttock and  thigh complications symptoms. There was a 10 mm gradient across the left  iliac lesion measured with an end-hole catheter. Therefore 4000 units of  heparin was given intravenously. The 5-French sheath was exchanged for long  7-French sheaths and simultaneous bilateral proximal common iliac PTA and  stentings were performed using an 8x29 Genesis on Opta on the right and an  8x24 Genesis on Opta on the left both on the proximal common iliac lesions.  Inflations were at  10 atmospheres for 45 seconds simultaneously. Post  inflation angiogram revealed some mild narrowing within the stent on the  right, therefore a second inflation was performed using a 9x2 cathter on the  right and the 8x25 catheter on the left. The second angiogram revealed this  to be significantly corrected with widely patent iliacs. Following this the  left distal common iliac lesion was treated with PTA and stenting using an  8x18 Genesis on Opta at 10 atmospheres for 45 seconds. Following this a  pigtail catheter was inserted over the guide wire and a completion angiogram  performed, which revealed widely patent iliacs bilaterally with no evidence  of dissection or stenosis. Following reversal of the heparin the sheaths  were removed. Adequate compression applied. No complications ensued.   FINDINGS:  1. Two renal arteries bilaterally widely patent.  2. Severe bilateral iliac occlusive disease with 80% right proximal common     iliac stenosis and 80% left distal common iliac stenosis and occlusion of     left internal iliac artery.  3. Severe right superficial femoral disease with occlusion in the mid thigh     and three vessel runoff on the right.  4. Mild to moderate left superficial femoral occlusive disease with two     vessel runoff on the left with the anterior tibial and peroneal arteries     being patent.  5. Successful PTCA and stenting of right common iliac stenosis and left     distal common iliac stenosis using  8x29 Genesis on Opta on the right and     an 8x24 Genesis on Opta and an 8x18 Genesis on Opta in the left iliac     system.                                                Quita Skye Hart Rochester, M.D.    JDL/MEDQ  D:  07/08/2003  T:  07/08/2003  Job:  [id number]

## 2011-05-18 ENCOUNTER — Encounter: Payer: Self-pay | Admitting: Nurse Practitioner

## 2011-05-25 ENCOUNTER — Other Ambulatory Visit: Payer: Self-pay | Admitting: Nurse Practitioner

## 2011-05-25 DIAGNOSIS — I252 Old myocardial infarction: Secondary | ICD-10-CM

## 2011-05-25 DIAGNOSIS — I259 Chronic ischemic heart disease, unspecified: Secondary | ICD-10-CM

## 2011-05-25 DIAGNOSIS — I10 Essential (primary) hypertension: Secondary | ICD-10-CM

## 2011-05-25 DIAGNOSIS — I739 Peripheral vascular disease, unspecified: Secondary | ICD-10-CM

## 2011-05-25 DIAGNOSIS — E785 Hyperlipidemia, unspecified: Secondary | ICD-10-CM

## 2011-05-28 ENCOUNTER — Encounter: Payer: Self-pay | Admitting: Nurse Practitioner

## 2011-05-28 ENCOUNTER — Ambulatory Visit (INDEPENDENT_AMBULATORY_CARE_PROVIDER_SITE_OTHER): Payer: Medicare Other | Admitting: Nurse Practitioner

## 2011-05-28 ENCOUNTER — Other Ambulatory Visit (INDEPENDENT_AMBULATORY_CARE_PROVIDER_SITE_OTHER): Payer: Medicare Other | Admitting: *Deleted

## 2011-05-28 VITALS — BP 120/62 | HR 62 | Ht 69.0 in | Wt 186.0 lb

## 2011-05-28 DIAGNOSIS — I10 Essential (primary) hypertension: Secondary | ICD-10-CM | POA: Insufficient documentation

## 2011-05-28 DIAGNOSIS — E785 Hyperlipidemia, unspecified: Secondary | ICD-10-CM

## 2011-05-28 DIAGNOSIS — I2510 Atherosclerotic heart disease of native coronary artery without angina pectoris: Secondary | ICD-10-CM | POA: Insufficient documentation

## 2011-05-28 DIAGNOSIS — I739 Peripheral vascular disease, unspecified: Secondary | ICD-10-CM

## 2011-05-28 DIAGNOSIS — F17200 Nicotine dependence, unspecified, uncomplicated: Secondary | ICD-10-CM

## 2011-05-28 DIAGNOSIS — I252 Old myocardial infarction: Secondary | ICD-10-CM

## 2011-05-28 DIAGNOSIS — I259 Chronic ischemic heart disease, unspecified: Secondary | ICD-10-CM

## 2011-05-28 DIAGNOSIS — Z720 Tobacco use: Secondary | ICD-10-CM

## 2011-05-28 LAB — HEPATIC FUNCTION PANEL
ALT: 12 U/L (ref 0–53)
AST: 20 U/L (ref 0–37)
Albumin: 4.2 g/dL (ref 3.5–5.2)
Alkaline Phosphatase: 45 U/L (ref 39–117)
Bilirubin, Direct: 0 mg/dL (ref 0.0–0.3)
Total Bilirubin: 0.5 mg/dL (ref 0.3–1.2)
Total Protein: 7.6 g/dL (ref 6.0–8.3)

## 2011-05-28 LAB — LIPID PANEL
Cholesterol: 122 mg/dL (ref 0–200)
HDL: 31 mg/dL — ABNORMAL LOW (ref >39.00)
LDL Cholesterol: 65 mg/dL (ref 0–99)
Total CHOL/HDL Ratio: 4
Triglycerides: 132 mg/dL (ref 0.0–149.0)
VLDL: 26.4 mg/dL (ref 0.0–40.0)

## 2011-05-28 LAB — BASIC METABOLIC PANEL
BUN: 16 mg/dL (ref 6–23)
CO2: 27 mEq/L (ref 19–32)
Calcium: 9.2 mg/dL (ref 8.4–10.5)
Chloride: 92 mEq/L — ABNORMAL LOW (ref 96–112)
Creatinine, Ser: 1.2 mg/dL (ref 0.4–1.5)
GFR: 66.73 mL/min (ref >60.00)
Glucose, Bld: 136 mg/dL — ABNORMAL HIGH (ref 70–99)
Potassium: 4.2 mEq/L (ref 3.5–5.1)
Sodium: 131 mEq/L — ABNORMAL LOW (ref 135–145)

## 2011-05-28 NOTE — Assessment & Plan Note (Signed)
Doing well. Smoking cessation is encouraged. No change in his medicines. He will see Dr. Elease Hashimoto in 6 months.

## 2011-05-28 NOTE — Assessment & Plan Note (Signed)
Labs are checked today.  

## 2011-05-28 NOTE — Assessment & Plan Note (Signed)
He has had extensive revascularization in the past. No complaints of claudication.

## 2011-05-28 NOTE — Patient Instructions (Addendum)
Stay on your current medicines Smoking cessation is encouraged We will see you back in 6 months. You will see Dr. Delane Ginger Call for any problems.

## 2011-05-28 NOTE — Progress Notes (Signed)
    [name] Date of Birth: Aug 09, 1946   History of Present Illness: [name] is seen back today for his 6 month visit. He is seen for Dr. Elease Hashimoto. He is a former patient of Dr. Ronnald Nian. He has multiple medical issues but continues to do fairly well. No chest pain. No claudication reported. He is not really short of breath. He continues to smoke. He tries to stay active. He is tolerating his medicines.   Current Outpatient Prescriptions on File Prior to Visit  Medication Sig Dispense Refill  . amLODipine (NORVASC) 10 MG tablet Take 10 mg by mouth daily.        Marland Kitchen aspirin 81 MG tablet Take 81 mg by mouth daily.        . citalopram (CELEXA) 20 MG tablet Take 20 mg by mouth daily.        . enalapril (VASOTEC) 20 MG tablet Take 20 mg by mouth daily.        . hydrochlorothiazide 25 MG tablet TAKE ONE TABLET BY MOUTH EVERY DAY  90 tablet  3  . metoprolol (LOPRESSOR) 100 MG tablet Take 100 mg by mouth daily.        . Potassium Gluconate 595 MG CAPS Take by mouth daily.          No Known Allergies  Past Medical History  Diagnosis Date  . IHD (ischemic heart disease)     Remote MI with CABG  . Old MI (myocardial infarction) 1989  . Hyperlipidemia   . Hypertension   . Depression   . Tobacco abuse   . Duodenal ulcer     AGE 65  . PVD (peripheral vascular disease)   . S/P CABG (coronary artery bypass graft) 1998  . S/P cardiac cath 2004    Grafts patent. Managed medically  . Normal nuclear stress test 2010  . Ischemic leg 2009    with extensive endarterectomy    Past Surgical History  Procedure Date  . Cardiac catheterization 05/04/2003`    EF 50-55%; Grafts patent. Managed medically  . Coronary artery bypass graft 1998  . Carotid endarterectomy 06/2008  . Appendectomy   . Cardiovascular stress test 12/14/2008    EF 52%  . R external iliac, common femoral and profunda femoris endarterectomy 2009  . R common femoral to popliteal bypass     History  Smoking status  . Current  Everyday Smoker -- 1.0 packs/day  Smokeless tobacco  . Not on file    History  Alcohol Use No    Family History  Problem Relation Age of Onset  . Lung disease Mother   . Osteoporosis Mother   . Lung cancer Father   . ALS Brother     Review of Systems: The review of systems is as above. He is taking his medicines.  All other systems were reviewed and are negative.  Physical Exam: BP 120/62  Pulse 62  Ht 5\' 9"  (1.753 m)  Wt 186 lb (84.369 kg)  BMI 27.47 kg/m2 Patient is pleasant and in no acute distress. He smells of tobacco. Skin is warm and dry. Color is normal.  HEENT is unremarkable. Normocephalic/atraumatic. PERRL. Sclera are nonicteric. Neck is supple. No masses. No JVD. Lungs are coarse with scattered ronchi and wheezes. Cardiac exam shows a regular rate and rhythm. Abdomen is soft. Extremities are without edema. Gait and ROM are intact. No gross neurologic deficits noted.   LABORATORY DATA: PENDING   Assessment / Plan:

## 2011-05-28 NOTE — Assessment & Plan Note (Signed)
Blood pressure is ok. No change in medicines.  

## 2011-05-29 ENCOUNTER — Telehealth: Payer: Self-pay | Admitting: *Deleted

## 2011-05-29 DIAGNOSIS — I2510 Atherosclerotic heart disease of native coronary artery without angina pectoris: Secondary | ICD-10-CM

## 2011-05-29 NOTE — Telephone Encounter (Signed)
Message copied by Lorayne Bender on Tue May 29, 2011 11:27 AM ------      Message from: Rosalio Macadamia      Created: Mon May 28, 2011  3:35 PM       Ok to report. Labs are satisfactory.  Stay on same medicines. Recheck BMET/HPF/LIPIDS  in 6 months.

## 2011-05-29 NOTE — Telephone Encounter (Signed)
Notified of lab results. Will repeat labs in 6 mo when sees Dr. Elease Hashimoto. Labs ordered.

## 2011-06-27 LAB — BASIC METABOLIC PANEL
CO2: 25
CO2: 30
Calcium: 9.2
Chloride: 93 — ABNORMAL LOW
Creatinine, Ser: 0.68
Creatinine, Ser: 0.72
GFR calc Af Amer: >60
GFR calc Af Amer: >60
Potassium: 3.6

## 2011-06-27 LAB — APTT: aPTT: 34

## 2011-06-27 LAB — CBC
HCT: 40
Hemoglobin: 14
MCHC: 34
MCHC: 34.9
MCV: 96.2
RBC: 4.16 — ABNORMAL LOW
RBC: 4.4
WBC: 8
WBC: 9.3

## 2011-06-27 LAB — PROTIME-INR
INR: 1
Prothrombin Time: 13.6

## 2011-06-29 LAB — COMPREHENSIVE METABOLIC PANEL
ALT: 37
ALT: 28
AST: 32
AST: 31
Albumin: 3.4 — ABNORMAL LOW
Alkaline Phosphatase: 53
Alkaline Phosphatase: 65
CO2: 28
CO2: 27
Chloride: 95 — ABNORMAL LOW
Chloride: 97
Creatinine, Ser: 0.69
Creatinine, Ser: 0.69
GFR calc Af Amer: >60
GFR calc Af Amer: >60
GFR calc non Af Amer: >60
GFR calc non Af Amer: >60
Potassium: 3.2 — ABNORMAL LOW
Total Bilirubin: 0.9
Total Bilirubin: 0.3

## 2011-06-29 LAB — CBC
MCHC: 34.3
MCV: 97.5
MCV: 97.2
Platelets: 167
RBC: 3.68 — ABNORMAL LOW
RBC: 4.3
RBC: 3.69 — ABNORMAL LOW
WBC: 10.1
WBC: 10.8 — ABNORMAL HIGH
WBC: 8.3

## 2011-06-29 LAB — URINALYSIS, ROUTINE W REFLEX MICROSCOPIC
Bilirubin Urine: NEGATIVE
Glucose, UA: NEGATIVE
Hgb urine dipstick: NEGATIVE
Ketones, ur: NEGATIVE
pH: 6.5

## 2011-06-29 LAB — BASIC METABOLIC PANEL
BUN: 3 — ABNORMAL LOW
CO2: 30
Calcium: 8.2 — ABNORMAL LOW
Creatinine, Ser: 0.77
GFR calc Af Amer: >60

## 2011-06-29 LAB — CROSSMATCH
ABO/RH(D): O POS
Antibody Screen: NEGATIVE

## 2011-06-29 LAB — URINE MICROSCOPIC-ADD ON

## 2011-06-29 LAB — ABO/RH: ABO/RH(D): O POS

## 2011-06-29 LAB — APTT: aPTT: 35

## 2011-07-09 LAB — COMPREHENSIVE METABOLIC PANEL
ALT: 36
AST: 39 — ABNORMAL HIGH
Albumin: 4
Alkaline Phosphatase: 78
Calcium: 9.4
GFR calc Af Amer: >60
Potassium: 4.1
Sodium: 129 — ABNORMAL LOW
Total Protein: 7.3

## 2011-07-09 LAB — CROSSMATCH: ABO/RH(D): O POS

## 2011-07-09 LAB — CBC
HCT: 34.5 — ABNORMAL LOW
Hemoglobin: 14.5
Hemoglobin: 11.9 — ABNORMAL LOW
MCHC: 33.9
MCHC: 34.5
MCV: 102.5 — ABNORMAL HIGH
Platelets: 241
Platelets: 174
RDW: 13.6
RDW: 13.6

## 2011-07-09 LAB — BASIC METABOLIC PANEL
BUN: 6
CO2: 29
Glucose, Bld: 114 — ABNORMAL HIGH
Potassium: 3.7
Sodium: 130 — ABNORMAL LOW

## 2011-07-09 LAB — URINALYSIS, ROUTINE W REFLEX MICROSCOPIC
Glucose, UA: 100 — AB
Hgb urine dipstick: NEGATIVE
Protein, ur: NEGATIVE
Specific Gravity, Urine: 1.006
Urobilinogen, UA: 0.2

## 2011-07-09 LAB — URINE MICROSCOPIC-ADD ON

## 2011-07-09 LAB — PROTIME-INR: Prothrombin Time: 12.6

## 2011-10-09 HISTORY — PX: CORONARY ANGIOPLASTY WITH STENT PLACEMENT: SHX49

## 2011-12-21 ENCOUNTER — Other Ambulatory Visit: Payer: Self-pay | Admitting: *Deleted

## 2011-12-21 ENCOUNTER — Other Ambulatory Visit: Payer: Self-pay | Admitting: Cardiovascular Disease

## 2011-12-21 ENCOUNTER — Other Ambulatory Visit: Payer: Self-pay | Admitting: Cardiology

## 2011-12-21 MED ORDER — SIMVASTATIN 20 MG PO TABS: 20.0000 mg | ORAL_TABLET | Freq: Every evening | ORAL | Status: DC

## 2011-12-21 MED ORDER — AMLODIPINE BESYLATE 10 MG PO TABS: 10.0000 mg | ORAL_TABLET | Freq: Every day | ORAL | Status: DC

## 2011-12-21 NOTE — Telephone Encounter (Signed)
Refilled celexa and metoprolol one time until appointment

## 2011-12-21 NOTE — Telephone Encounter (Signed)
New msg Pt wants simvastatin and amlodipine called to [facility] [place] [phone] He is out of pills and has been trying to get this rx for three days. He has appt with Dr Elease Hashimoto on [phone]. He is a former Dr Deborah Chalk pt

## 2011-12-21 NOTE — Telephone Encounter (Signed)
msg left that refills were provided and to come to app fasting for labs. Phone number left in msg.

## 2012-01-15 ENCOUNTER — Encounter: Payer: Self-pay | Admitting: Cardiovascular Disease

## 2012-01-15 ENCOUNTER — Ambulatory Visit (INDEPENDENT_AMBULATORY_CARE_PROVIDER_SITE_OTHER): Payer: Medicare Other | Admitting: Cardiovascular Disease

## 2012-01-15 ENCOUNTER — Other Ambulatory Visit: Payer: Medicare Other

## 2012-01-15 VITALS — BP 134/68 | HR 61 | Ht 68.5 in | Wt 188.0 lb

## 2012-01-15 DIAGNOSIS — R9431 Abnormal electrocardiogram [ECG] [EKG]: Secondary | ICD-10-CM

## 2012-01-15 DIAGNOSIS — I10 Essential (primary) hypertension: Secondary | ICD-10-CM

## 2012-01-15 DIAGNOSIS — I2510 Atherosclerotic heart disease of native coronary artery without angina pectoris: Secondary | ICD-10-CM

## 2012-01-15 DIAGNOSIS — E785 Hyperlipidemia, unspecified: Secondary | ICD-10-CM

## 2012-01-15 LAB — BASIC METABOLIC PANEL
Calcium: 9 mg/dL (ref 8.4–10.5)
Creatinine, Ser: 1.2 mg/dL (ref 0.4–1.5)
GFR: 64.67 mL/min (ref >60.00)
Glucose, Bld: 90 mg/dL (ref 70–99)
Sodium: 137 mEq/L (ref 135–145)

## 2012-01-15 LAB — LIPID PANEL: HDL: 30.9 mg/dL — ABNORMAL LOW (ref >39.00)

## 2012-01-15 LAB — HEPATIC FUNCTION PANEL
ALT: 17 U/L (ref 0–53)
AST: 20 U/L (ref 0–37)
Alkaline Phosphatase: 44 U/L (ref 39–117)
Bilirubin, Direct: 0 mg/dL (ref 0.0–0.3)
Total Bilirubin: 0.2 mg/dL — ABNORMAL LOW (ref 0.3–1.2)

## 2012-01-15 MED ORDER — METOPROLOL TARTRATE 100 MG PO TABS: 100.0000 mg | ORAL_TABLET | Freq: Every day | ORAL | Status: DC

## 2012-01-15 MED ORDER — SIMVASTATIN 20 MG PO TABS: 20.0000 mg | ORAL_TABLET | Freq: Every evening | ORAL | Status: DC

## 2012-01-15 MED ORDER — HYDROCHLOROTHIAZIDE 25 MG PO TABS: 25.0000 mg | ORAL_TABLET | Freq: Every day | ORAL | Status: DC

## 2012-01-15 MED ORDER — POTASSIUM GLUCONATE 595 MG PO CAPS: 1.0000 | ORAL_CAPSULE | Freq: Every day | ORAL | Status: DC

## 2012-01-15 MED ORDER — AMLODIPINE BESYLATE 10 MG PO TABS: 10.0000 mg | ORAL_TABLET | Freq: Every day | ORAL | Status: DC

## 2012-01-15 NOTE — Patient Instructions (Signed)
Your physician wants you to follow-up in: 6 months  You will receive a reminder letter in the mail two months in advance. If you don't receive a letter, please call our office to schedule the follow-up appointment.  Your physician recommends that you return for a FASTING lipid profile: today and in 6 months   Your physician has requested that you have a lexiscan myoview.  Please follow instruction sheet, as given.

## 2012-01-15 NOTE — Assessment & Plan Note (Addendum)
[name] is doing fairly well. He does have some significant EKG changes today and I have suggested that we proceed with a Lexus scan Myoview study. He has extensive CAD and peripheral vascular disease.  We'll check fasting labs today.  He continues to smoke. I have told hime that  he will feel better if he stops smoking.  I'll see him in 6 months for office visit antacid meds. I'll see him sooner if the stress test is abnormal and we need to arrange for cardiac catheterization.

## 2012-01-15 NOTE — Progress Notes (Signed)
[name] Date of Birth  Feb 14, 1946 [facility]  [address]    [address]   [address] [place], [place]  [address]    [place], [place]  [address] [phone]  Fax  [phone]  [phone]  Fax [phone]  Problem list: 1. 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 Hypertension 3. Peripheral vascular disease  History of Present Illness:  [name] is a 66 year old gentleman with a long history of coronary artery disease.  He's not exercising on a regular basis.  He still smokes between one half and one pack of cigarettes a day.  He's not had any episodes of chest pain or shortness of breath. He has claudication in both legs when he walks. He denies any chest pain or shortness of breath./  Current Outpatient Prescriptions on File Prior to Visit  Medication Sig Dispense Refill  . amLODipine (NORVASC) 10 MG tablet Take 1 tablet (10 mg total) by mouth daily.  30 tablet  1  . aspirin 81 MG tablet Take 81 mg by mouth daily.        . citalopram (CELEXA) 20 MG tablet TAKE ONE TABLET BY MOUTH EVERY DAY  30 tablet  0  . enalapril (VASOTEC) 20 MG tablet Take 20 mg by mouth daily.        . hydrochlorothiazide 25 MG tablet TAKE ONE TABLET BY MOUTH EVERY DAY  90 tablet  3  . metoprolol (LOPRESSOR) 100 MG tablet TAKE ONE TABLET BY MOUTH EVERY DAY  90 tablet  0  . Potassium Gluconate 595 MG CAPS Take by mouth daily.        . simvastatin (ZOCOR) 20 MG tablet Take 1 tablet (20 mg total) by mouth every evening.  30 tablet  0    No Known Allergies  Past Medical History  Diagnosis Date  . IHD (ischemic heart disease)     Remote MI with CABG  . Old MI (myocardial infarction) 1989  . Hyperlipidemia   . Hypertension   . Depression   . Tobacco abuse   . Duodenal ulcer     AGE 25  . PVD (peripheral vascular disease)   . S/P CABG (coronary artery bypass graft) 1998  . S/P cardiac cath 2004    Grafts patent.  Managed medically  . Normal nuclear stress test 2010  . Ischemic leg 2009    with extensive endarterectomy    Past Surgical History  Procedure Date  . Cardiac catheterization 05/04/2003`    EF 50-55%; Grafts patent. Managed medically  . Coronary artery bypass graft 1998  . Carotid endarterectomy 06/2008  . Appendectomy   . Cardiovascular stress test 12/14/2008    EF 52%  . R external iliac, common femoral and profunda femoris endarterectomy 2009  . R common femoral to popliteal bypass     History  Smoking status  . Current Everyday Smoker -- 1.0 packs/day  Smokeless tobacco  . Not on file    History  Alcohol Use No    Family History  Problem Relation Age of Onset  . Lung disease Mother   . Osteoporosis Mother   . Lung cancer Father   . ALS Brother     Reviw of Systems:  Reviewed in the HPI.  All other systems are negative.  Physical Exam: Blood pressure 134/68, pulse 61, height 5' 8.5" (1.74 m), weight 188 lb (85.276 kg). General: Well developed, well nourished, in no  acute distress.  Head: Normocephalic, atraumatic, sclera non-icteric, mucus membranes are moist,   Neck: Supple. Carotids are 2 + without bruits. No JVD  Lungs: Clear bilaterally to auscultation.  Heart: regular rate.  normal  S1 S2. No murmurs, gallops or rubs.  Abdomen: Soft, non-tender, non-distended with normal bowel sounds. No hepatomegaly. No rebound/guarding. No masses.  Msk:  Strength and tone are normal  Extremities: No clubbing or cyanosis. No edema.  Distal pedal pulses are 2+ and equal bilaterally.  Neuro: Alert and oriented X 3. Moves all extremities spontaneously.  Psych:  Responds to questions appropriately with a normal affect.  ECG: 01/15/2012-  normal sinus rhythm. He has left ventricular hypertrophy with repolarization changes. There is an old inferior wall myocardial infarction.  The lateral ST/T wave changes are new from his previous EKG.  Assessment / Plan:

## 2012-01-21 ENCOUNTER — Telehealth: Payer: Self-pay | Admitting: *Deleted

## 2012-01-21 DIAGNOSIS — I10 Essential (primary) hypertension: Secondary | ICD-10-CM

## 2012-01-21 DIAGNOSIS — E785 Hyperlipidemia, unspecified: Secondary | ICD-10-CM

## 2012-01-21 DIAGNOSIS — R9431 Abnormal electrocardiogram [ECG] [EKG]: Secondary | ICD-10-CM

## 2012-01-21 DIAGNOSIS — I2510 Atherosclerotic heart disease of native coronary artery without angina pectoris: Secondary | ICD-10-CM

## 2012-01-21 MED ORDER — ENALAPRIL MALEATE 20 MG PO TABS: 20.0000 mg | ORAL_TABLET | Freq: Every day | ORAL | Status: DC

## 2012-01-21 MED ORDER — AMLODIPINE BESYLATE 10 MG PO TABS: 10.0000 mg | ORAL_TABLET | Freq: Every day | ORAL | Status: DC

## 2012-01-21 MED ORDER — SIMVASTATIN 20 MG PO TABS: 20.0000 mg | ORAL_TABLET | Freq: Every evening | ORAL | Status: DC

## 2012-01-21 NOTE — Telephone Encounter (Signed)
meds reordered, labs given

## 2012-01-21 NOTE — Telephone Encounter (Signed)
Message copied by Antony Odea on Mon Jan 21, 2012  4:17 PM ------      Message from: Vesta Mixer      Created: Tue Jan 15, 2012  6:55 PM       His electrolytes are normal. His triglyceride levels are still a little bit elevated and his HDL is low. This could be improved if he were to stop smoking.

## 2012-02-06 DIAGNOSIS — G4734 Idiopathic sleep related nonobstructive alveolar hypoventilation: Secondary | ICD-10-CM

## 2012-02-06 DIAGNOSIS — I214 Non-ST elevation (NSTEMI) myocardial infarction: Secondary | ICD-10-CM

## 2012-02-06 HISTORY — DX: Idiopathic sleep related nonobstructive alveolar hypoventilation: G47.34

## 2012-02-06 HISTORY — DX: Non-ST elevation (NSTEMI) myocardial infarction: I21.4

## 2012-02-10 ENCOUNTER — Other Ambulatory Visit: Payer: Self-pay | Admitting: Nurse Practitioner

## 2012-02-11 NOTE — Telephone Encounter (Signed)
..   Requested Prescriptions   Pending Prescriptions Disp Refills  . citalopram (CELEXA) 20 MG tablet [Pharmacy Med Name: CITALOPRAM 20MG      TAB] 30 tablet 6    Sig: TAKE ONE TABLET BY MOUTH EVERY DAY

## 2012-02-12 ENCOUNTER — Ambulatory Visit (HOSPITAL_COMMUNITY): Payer: Medicare Other | Attending: Cardiovascular Disease | Admitting: Radiology

## 2012-02-12 VITALS — BP 116/70 | Ht 68.5 in | Wt 183.0 lb

## 2012-02-12 DIAGNOSIS — R9431 Abnormal electrocardiogram [ECG] [EKG]: Secondary | ICD-10-CM | POA: Insufficient documentation

## 2012-02-12 DIAGNOSIS — Z9861 Coronary angioplasty status: Secondary | ICD-10-CM | POA: Insufficient documentation

## 2012-02-12 DIAGNOSIS — I252 Old myocardial infarction: Secondary | ICD-10-CM | POA: Insufficient documentation

## 2012-02-12 DIAGNOSIS — E785 Hyperlipidemia, unspecified: Secondary | ICD-10-CM | POA: Insufficient documentation

## 2012-02-12 DIAGNOSIS — F17200 Nicotine dependence, unspecified, uncomplicated: Secondary | ICD-10-CM | POA: Insufficient documentation

## 2012-02-12 DIAGNOSIS — I2510 Atherosclerotic heart disease of native coronary artery without angina pectoris: Secondary | ICD-10-CM | POA: Insufficient documentation

## 2012-02-12 DIAGNOSIS — I10 Essential (primary) hypertension: Secondary | ICD-10-CM | POA: Insufficient documentation

## 2012-02-12 DIAGNOSIS — I779 Disorder of arteries and arterioles, unspecified: Secondary | ICD-10-CM | POA: Insufficient documentation

## 2012-02-12 DIAGNOSIS — Z951 Presence of aortocoronary bypass graft: Secondary | ICD-10-CM | POA: Insufficient documentation

## 2012-02-12 DIAGNOSIS — Z8673 Personal history of transient ischemic attack (TIA), and cerebral infarction without residual deficits: Secondary | ICD-10-CM | POA: Insufficient documentation

## 2012-02-12 DIAGNOSIS — I739 Peripheral vascular disease, unspecified: Secondary | ICD-10-CM | POA: Insufficient documentation

## 2012-02-12 DIAGNOSIS — R002 Palpitations: Secondary | ICD-10-CM | POA: Insufficient documentation

## 2012-02-12 MED ORDER — REGADENOSON 0.4 MG/5ML IV SOLN
0.4000 mg | Freq: Once | INTRAVENOUS | Status: AC
  Administered 2012-02-12: 0.4 mg via INTRAVENOUS

## 2012-02-12 MED ORDER — TECHNETIUM TC 99M TETROFOSMIN IV KIT
10.0000 | PACK | Freq: Once | INTRAVENOUS | Status: AC | PRN
  Administered 2012-02-12: 10 via INTRAVENOUS

## 2012-02-12 MED ORDER — TECHNETIUM TC 99M TETROFOSMIN IV KIT
30.0000 | PACK | Freq: Once | INTRAVENOUS | Status: AC | PRN
  Administered 2012-02-12: 30 via INTRAVENOUS

## 2012-02-12 NOTE — Progress Notes (Signed)
[facility] SITE 3 NUCLEAR MED [address] [place] [address] [phone]  Cardiology Nuclear Med [name] is a 66 y.o. male     MRN : [id number]     DOB: 13-May-1946  Procedure Date: 02/12/2012  Nuclear Med Background Indication for Stress Test:  Evaluation for Ischemia, Graft Patency and Abnormal EKG History:  '89 IWMI>PTCA; '98 CABG; '90 PTCA; '04 Cath:Patent grafts; '10 MPS;Normal, EF=52% Cardiac Risk Factors: Carotid Disease, Claudication, Hypertension, Lipids, PVD, Smoker and TIA  Symptoms:  Palpitations   Nuclear Pre-Procedure Caffeine/Decaff Intake:  None NPO After: 12:00am   Lungs:  Clear. O2 Sat: 96% on room air. IV 0.9% NS with Angio Cath:  20g  IV Site: R Forearm  IV Started by:  Stanton Kidney, EMT-P  Chest Size (in):  44 Cup Size: n/a  Height: 5' 8.5" (1.74 m)  Weight:  183 lb (83.008 kg)  BMI:  Body mass index is 27.42 kg/(m^2). Tech Comments:  Meds were taken as directed, per patient.    Nuclear Med Study 1 or 2 day study: 1 day  Stress Test Type:  Lexiscan  Reading MD: Willa Rough, MD  Order Authorizing Provider:  Kristeen Miss, MD  Resting Radionuclide: Technetium 26m Tetrofosmin  Resting Radionuclide Dose: 10.4 mCi   Stress Radionuclide:  Technetium 41m Tetrofosmin  Stress Radionuclide Dose: 31.8 mCi           Stress Protocol Rest HR: 65 Stress HR: 76  Rest BP: 116/70 Stress BP: 116/67  Exercise Time (min): n/a METS: n/a   Predicted Max HR: 153 bpm % Max HR: 49.67 bpm Rate Pressure Product: 8816   Dose of Adenosine (mg):  n/a Dose of Lexiscan: 0.4 mg  Dose of Atropine (mg): n/a Dose of Dobutamine: n/a mcg/kg/min (at max HR)  Stress Test Technologist: Smiley Houseman, CMA-N  Nuclear Technologist:  Domenic Polite, CNMT     Rest Procedure:  Myocardial perfusion imaging was performed at rest 45 minutes following the intravenous administration of Technetium 69m Tetrofosmin.  Rest ECG: LVH with strain and occasional  PAC's.  Stress Procedure:  The patient received IV Lexiscan 0.4 mg over 15-seconds.  Technetium 7m Tetrofosmin injected at 30-seconds.  There were no significant changes with Lexiscan bolus, occasional PAC's were noted.  Quantitative spect images were obtained after a 45 minute delay.  Stress ECG: No significant change from baseline ECG  QPS Raw Data Images:  Patient motion noted; appropriate software correction applied. Stress Images:  There is severe decreased activity in a large area affecting the entire inferior wall at the base mid and apical segments. There is a moderately severe defect affecting a large area at the base and mid inferior septum and base and mid anterior septum. There is mild decreased activity in a small area of affecting the base and mid inferolateral wall. Rest Images:  The findings are similar to the stress images except for the fact that there is no decreased activity in the inferolateral wall. Subtraction (SDS):  There is mild ischemia in the inferolateral wall. Transient Ischemic Dilatation (Normal <1.22):  1.03 Lung/Heart Ratio (Normal <0.45):  0.30  Quantitative Gated Spect Images QGS EDV:  182 ml QGS ESV:  117 ml  Impression Exercise Capacity:  Lexiscan with no exercise. BP Response:  Normal blood pressure response. Clinical Symptoms:  shortness of breath ECG Impression:  No significant ST segment change suggestive of ischemia. Comparison with Prior Nuclear Study: There are marked changes since the study of  2010. There were no marked abnormalities at that time.  Overall Impression:  Study is markedly abnormal. There are significant changes since 2010. There is now a large scar that affects the entire inferior wall and much of the inferior and anterior septum. In addition there is mild ischemia in the inferolateral wall.  LV Ejection Fraction: 36%.  LV Wall Motion:  There is akinesis of the inferior wall and the septum.  [name],,MD

## 2012-02-13 ENCOUNTER — Telehealth: Payer: Self-pay | Admitting: Cardiovascular Disease

## 2012-02-13 NOTE — Telephone Encounter (Signed)
Pt will come in for pre op app to schedule LHC W/ GRAFTS due to results of abnormal stress test

## 2012-02-13 NOTE — Telephone Encounter (Signed)
New Problem: ° ° ° °Patient returned your call.  Please call back. °

## 2012-02-18 ENCOUNTER — Encounter: Payer: Self-pay | Admitting: Nurse Practitioner

## 2012-02-18 ENCOUNTER — Inpatient Hospital Stay (HOSPITAL_COMMUNITY)
Admission: AD | Admit: 2012-02-18 | Discharge: 2012-02-20 | DRG: 247 | Disposition: A | Discharge status: Home / Self Care | Payer: Medicare Other | Source: Ambulatory Visit | Attending: Cardiovascular Disease | Admitting: Cardiovascular Disease

## 2012-02-18 ENCOUNTER — Inpatient Hospital Stay (HOSPITAL_COMMUNITY): Payer: Medicare Other

## 2012-02-18 ENCOUNTER — Other Ambulatory Visit: Payer: Self-pay | Admitting: Nurse Practitioner

## 2012-02-18 ENCOUNTER — Ambulatory Visit (INDEPENDENT_AMBULATORY_CARE_PROVIDER_SITE_OTHER): Payer: Medicare Other | Admitting: Nurse Practitioner

## 2012-02-18 ENCOUNTER — Encounter (HOSPITAL_COMMUNITY): Payer: Self-pay

## 2012-02-18 DIAGNOSIS — Z720 Tobacco use: Secondary | ICD-10-CM | POA: Diagnosis present

## 2012-02-18 DIAGNOSIS — F329 Major depressive disorder, single episode, unspecified: Secondary | ICD-10-CM | POA: Diagnosis present

## 2012-02-18 DIAGNOSIS — I10 Essential (primary) hypertension: Secondary | ICD-10-CM

## 2012-02-18 DIAGNOSIS — F3289 Other specified depressive episodes: Secondary | ICD-10-CM | POA: Diagnosis present

## 2012-02-18 DIAGNOSIS — Z7982 Long term (current) use of aspirin: Secondary | ICD-10-CM

## 2012-02-18 DIAGNOSIS — R0989 Other specified symptoms and signs involving the circulatory and respiratory systems: Secondary | ICD-10-CM

## 2012-02-18 DIAGNOSIS — R079 Chest pain, unspecified: Secondary | ICD-10-CM

## 2012-02-18 DIAGNOSIS — Z79899 Other long term (current) drug therapy: Secondary | ICD-10-CM

## 2012-02-18 DIAGNOSIS — E785 Hyperlipidemia, unspecified: Secondary | ICD-10-CM | POA: Diagnosis present

## 2012-02-18 DIAGNOSIS — D649 Anemia, unspecified: Secondary | ICD-10-CM | POA: Diagnosis present

## 2012-02-18 DIAGNOSIS — I25810 Atherosclerosis of coronary artery bypass graft(s) without angina pectoris: Secondary | ICD-10-CM | POA: Diagnosis present

## 2012-02-18 DIAGNOSIS — I739 Peripheral vascular disease, unspecified: Secondary | ICD-10-CM | POA: Diagnosis present

## 2012-02-18 DIAGNOSIS — I2510 Atherosclerotic heart disease of native coronary artery without angina pectoris: Secondary | ICD-10-CM | POA: Diagnosis present

## 2012-02-18 DIAGNOSIS — I252 Old myocardial infarction: Secondary | ICD-10-CM

## 2012-02-18 DIAGNOSIS — Z87891 Personal history of nicotine dependence: Secondary | ICD-10-CM

## 2012-02-18 DIAGNOSIS — I214 Non-ST elevation (NSTEMI) myocardial infarction: Principal | ICD-10-CM | POA: Diagnosis present

## 2012-02-18 DIAGNOSIS — R9431 Abnormal electrocardiogram [ECG] [EKG]: Secondary | ICD-10-CM

## 2012-02-18 HISTORY — DX: Atherosclerotic heart disease of native coronary artery without angina pectoris: I25.10

## 2012-02-18 HISTORY — DX: Idiopathic sleep related nonobstructive alveolar hypoventilation: G47.34

## 2012-02-18 HISTORY — DX: Anemia, unspecified: D64.9

## 2012-02-18 LAB — DIFFERENTIAL
Basophils Absolute: 0.1 10*3/uL (ref 0.0–0.1)
Basophils Relative: 1 % (ref 0–1)
Eosinophils Absolute: 0.6 10*3/uL (ref 0.0–0.7)
Eosinophils Relative: 6 % — ABNORMAL HIGH (ref 0–5)
Lymphocytes Relative: 17 % (ref 12–46)
Lymphs Abs: 1.7 10*3/uL (ref 0.7–4.0)
Monocytes Absolute: 1 10*3/uL (ref 0.1–1.0)
Monocytes Relative: 10 % (ref 3–12)
Neutro Abs: 6.7 10*3/uL (ref 1.7–7.7)
Neutrophils Relative %: 66 % (ref 43–77)

## 2012-02-18 LAB — CARDIAC PANEL(CRET KIN+CKTOT+MB+TROPI)
CK, MB: 3.8 ng/mL (ref 0.3–4.0)
CK, MB: 4 ng/mL (ref 0.3–4.0)
Relative Index: INVALID (ref 0.0–2.5)
Relative Index: INVALID (ref 0.0–2.5)
Total CK: 93 U/L (ref 7–232)
Total CK: 96 U/L (ref 7–232)
Troponin I: 0.33 ng/mL (ref <0.30)
Troponin I: 0.37 ng/mL (ref <0.30)

## 2012-02-18 LAB — CBC
HCT: 33.8 % — ABNORMAL LOW (ref 39.0–52.0)
Hemoglobin: 11.7 g/dL — ABNORMAL LOW (ref 13.0–17.0)
MCH: 32.2 pg (ref 26.0–34.0)
MCHC: 34.6 g/dL (ref 30.0–36.0)
MCV: 93.1 fL (ref 78.0–100.0)
Platelets: 186 10*3/uL (ref 150–400)
RBC: 3.63 MIL/uL — ABNORMAL LOW (ref 4.22–5.81)
RDW: 13.8 % (ref 11.5–15.5)
WBC: 10 10*3/uL (ref 4.0–10.5)

## 2012-02-18 LAB — MRSA PCR SCREENING: MRSA by PCR: NEGATIVE

## 2012-02-18 LAB — COMPREHENSIVE METABOLIC PANEL
ALT: 11 U/L (ref 0–53)
AST: 15 U/L (ref 0–37)
Albumin: 3.5 g/dL (ref 3.5–5.2)
Alkaline Phosphatase: 47 U/L (ref 39–117)
BUN: 18 mg/dL (ref 6–23)
CO2: 24 mEq/L (ref 19–32)
Calcium: 8.8 mg/dL (ref 8.4–10.5)
Chloride: 99 mEq/L (ref 96–112)
Creatinine, Ser: 0.89 mg/dL (ref 0.50–1.35)
GFR calc Af Amer: >90 mL/min (ref >90)
GFR calc non Af Amer: 87 mL/min — ABNORMAL LOW (ref >90)
Glucose, Bld: 103 mg/dL — ABNORMAL HIGH (ref 70–99)
Potassium: 3.7 mEq/L (ref 3.5–5.1)
Sodium: 134 mEq/L — ABNORMAL LOW (ref 135–145)
Total Bilirubin: 0.2 mg/dL — ABNORMAL LOW (ref 0.3–1.2)
Total Protein: 6.8 g/dL (ref 6.0–8.3)

## 2012-02-18 LAB — PROTIME-INR
INR: 1.06 (ref 0.00–1.49)
Prothrombin Time: 14 seconds (ref 11.6–15.2)

## 2012-02-18 LAB — HEMOGLOBIN A1C
Hgb A1c MFr Bld: 5.9 % — ABNORMAL HIGH (ref <5.7)
Mean Plasma Glucose: 123 mg/dL — ABNORMAL HIGH (ref <117)

## 2012-02-18 LAB — APTT: aPTT: 36 seconds (ref 24–37)

## 2012-02-18 LAB — HEPARIN LEVEL (UNFRACTIONATED): Heparin Unfractionated: 0.16 IU/mL — ABNORMAL LOW (ref 0.30–0.70)

## 2012-02-18 LAB — TSH: TSH: 1.871 u[IU]/mL (ref 0.350–4.500)

## 2012-02-18 MED ORDER — HEPARIN (PORCINE) IN NACL 100-0.45 UNIT/ML-% IJ SOLN
1300.0000 [IU]/h | INTRAMUSCULAR | Status: DC
  Administered 2012-02-19: 1300 [IU]/h via INTRAVENOUS
  Filled 2012-02-18 (×4): qty 250

## 2012-02-18 MED ORDER — ASPIRIN 81 MG PO CHEW
324.0000 mg | CHEWABLE_TABLET | ORAL | Status: AC
  Administered 2012-02-19: 324 mg via ORAL
  Filled 2012-02-18: qty 4

## 2012-02-18 MED ORDER — NITROGLYCERIN IN D5W 200-5 MCG/ML-% IV SOLN
3.0000 ug/min | INTRAVENOUS | Status: DC
  Administered 2012-02-18: 10 ug/min via INTRAVENOUS
  Filled 2012-02-18: qty 250

## 2012-02-18 MED ORDER — HEPARIN BOLUS VIA INFUSION
4000.0000 [IU] | Freq: Once | INTRAVENOUS | Status: AC
  Administered 2012-02-18: 4000 [IU] via INTRAVENOUS
  Filled 2012-02-18: qty 4000

## 2012-02-18 MED ORDER — SODIUM CHLORIDE 0.9 % IJ SOLN: 3.0000 mL | INTRAMUSCULAR | Status: DC | PRN

## 2012-02-18 MED ORDER — DIAZEPAM 5 MG PO TABS
10.0000 mg | ORAL_TABLET | ORAL | Status: AC
  Administered 2012-02-19: 10 mg via ORAL
  Filled 2012-02-18: qty 1

## 2012-02-18 MED ORDER — DIAZEPAM 5 MG PO TABS: 10.0000 mg | ORAL_TABLET | ORAL | Status: DC

## 2012-02-18 MED ORDER — SODIUM CHLORIDE 0.9 % IV SOLN
INTRAVENOUS | Status: DC
  Administered 2012-02-18: 20 mL/h via INTRAVENOUS

## 2012-02-18 MED ORDER — AMLODIPINE BESYLATE 10 MG PO TABS
10.0000 mg | ORAL_TABLET | Freq: Every day | ORAL | Status: DC
  Administered 2012-02-19 – 2012-02-20 (×2): 10 mg via ORAL
  Filled 2012-02-18 (×4): qty 1

## 2012-02-18 MED ORDER — SODIUM CHLORIDE 0.9 % IJ SOLN
3.0000 mL | Freq: Two times a day (BID) | INTRAMUSCULAR | Status: DC
Start: 2012-02-18 — End: 2012-02-20
  Administered 2012-02-18: 3 mL via INTRAVENOUS
  Administered 2012-02-18: 13:00:00 via INTRAVENOUS
  Administered 2012-02-19: 3 mL via INTRAVENOUS

## 2012-02-18 MED ORDER — METOPROLOL TARTRATE 50 MG PO TABS
50.0000 mg | ORAL_TABLET | Freq: Two times a day (BID) | ORAL | Status: DC
  Administered 2012-02-18 – 2012-02-19 (×2): 50 mg via ORAL
  Filled 2012-02-18 (×4): qty 1

## 2012-02-18 MED ORDER — ASPIRIN EC 81 MG PO TBEC: 81.0000 mg | DELAYED_RELEASE_TABLET | Freq: Every day | ORAL | Status: DC

## 2012-02-18 MED ORDER — ENALAPRIL MALEATE 20 MG PO TABS
20.0000 mg | ORAL_TABLET | Freq: Every day | ORAL | Status: DC
  Administered 2012-02-19 – 2012-02-20 (×2): 20 mg via ORAL
  Filled 2012-02-18 (×4): qty 1

## 2012-02-18 MED ORDER — ASPIRIN 81 MG PO CHEW: 324.0000 mg | CHEWABLE_TABLET | ORAL | Status: DC

## 2012-02-18 MED ORDER — PNEUMOCOCCAL VAC POLYVALENT 25 MCG/0.5ML IJ INJ
0.5000 mL | INJECTION | INTRAMUSCULAR | Status: AC
  Filled 2012-02-18: qty 0.5

## 2012-02-18 MED ORDER — NITROGLYCERIN 0.4 MG SL SUBL: 0.4000 mg | SUBLINGUAL_TABLET | SUBLINGUAL | Status: DC | PRN

## 2012-02-18 MED ORDER — SODIUM CHLORIDE 0.9 % IV SOLN: 250.0000 mL | INTRAVENOUS | Status: DC | PRN

## 2012-02-18 MED ORDER — SODIUM CHLORIDE 0.9 % IV SOLN
INTRAVENOUS | Status: DC
  Administered 2012-02-19: 04:00:00 via INTRAVENOUS

## 2012-02-18 MED ORDER — ASPIRIN 300 MG RE SUPP
300.0000 mg | RECTAL | Status: AC
  Filled 2012-02-18: qty 1

## 2012-02-18 MED ORDER — HEPARIN BOLUS VIA INFUSION
2500.0000 [IU] | Freq: Once | INTRAVENOUS | Status: AC
  Administered 2012-02-18: 2500 [IU] via INTRAVENOUS
  Filled 2012-02-18: qty 2500

## 2012-02-18 MED ORDER — ASPIRIN 81 MG PO CHEW
324.0000 mg | CHEWABLE_TABLET | ORAL | Status: AC
  Administered 2012-02-18: 324 mg via ORAL
  Filled 2012-02-18: qty 4

## 2012-02-18 MED ORDER — HYDROCHLOROTHIAZIDE 25 MG PO TABS
25.0000 mg | ORAL_TABLET | Freq: Every day | ORAL | Status: DC
  Administered 2012-02-19 – 2012-02-20 (×2): 25 mg via ORAL
  Filled 2012-02-18 (×3): qty 1

## 2012-02-18 MED ORDER — ONDANSETRON HCL 4 MG/2ML IJ SOLN: 4.0000 mg | Freq: Four times a day (QID) | INTRAMUSCULAR | Status: DC | PRN

## 2012-02-18 MED ORDER — HEPARIN (PORCINE) IN NACL 100-0.45 UNIT/ML-% IJ SOLN
1000.0000 [IU]/h | INTRAMUSCULAR | Status: DC
  Administered 2012-02-18: 1000 [IU]/h via INTRAVENOUS
  Filled 2012-02-18: qty 250

## 2012-02-18 MED ORDER — CITALOPRAM HYDROBROMIDE 20 MG PO TABS
20.0000 mg | ORAL_TABLET | Freq: Every day | ORAL | Status: DC
  Administered 2012-02-19 – 2012-02-20 (×2): 20 mg via ORAL
  Filled 2012-02-18 (×3): qty 1

## 2012-02-18 MED ORDER — ACETAMINOPHEN 325 MG PO TABS: 650.0000 mg | ORAL_TABLET | ORAL | Status: DC | PRN

## 2012-02-18 MED ORDER — SIMVASTATIN 20 MG PO TABS
20.0000 mg | ORAL_TABLET | Freq: Every day | ORAL | Status: DC
  Administered 2012-02-19 – 2012-02-20 (×2): 20 mg via ORAL
  Filled 2012-02-18 (×4): qty 1

## 2012-02-18 NOTE — Assessment & Plan Note (Signed)
He has extensive PVD.

## 2012-02-18 NOTE — Consult Note (Signed)
ANTICOAGULATION CONSULT NOTE - follow-up Consult  Pharmacy Consult for Heparin Indication: chest pain/ACS  No Known Allergies  Patient Measurements: Height: 5\' 8"  (172.7 cm) Weight: 186 lb 11.7 oz (84.7 kg) IBW/kg (Calculated) : 68.4   Vital Signs: Temp: 97.9 F (36.6 C) (05/13 1600) Temp src: Oral (05/13 1600) BP: 115/51 mmHg (05/13 1800) Pulse Rate: 60  (05/13 1800)  Labs:  Basename 02/18/12 1845 02/18/12 1741 02/18/12 1205  HGB -- -- 11.7*  HCT -- -- 33.8*  PLT -- -- 186  APTT -- -- 36  LABPROT -- -- 14.0  INR -- -- 1.06  HEPARINUNFRC 0.16* -- --  CREATININE -- -- 0.89  CKTOTAL -- 96 93  CKMB -- 4.0 3.8  TROPONINI -- 0.37* 0.33*    Estimated Creatinine Clearance: 85.3 ml/min (by C-G formula based on Cr of 0.89).  Medical History: Past Medical History  Diagnosis Date  . IHD (ischemic heart disease)     Remote MI with CABG in 1998  . Old MI (myocardial infarction) 1989    Anterior MI  . Hyperlipidemia   . Hypertension   . Depression   . Tobacco abuse   . Duodenal ulcer     AGE 66  . PVD (peripheral vascular disease)     prior extensive endarterectomy of the right external iliac, common femoral bypass, prior R CEA  . S/P CABG (coronary artery bypass graft) 1998  . S/P cardiac cath 2004    Grafts patent. Managed medically  . Normal nuclear stress test 2010  . Ischemic leg 2009    with extensive endarterectomy  . Coronary artery disease   . Dysrhythmia    Medications:  Prescriptions prior to admission  Medication Sig Dispense Refill  . amLODipine (NORVASC) 10 MG tablet Take 10 mg by mouth daily.      Marland Kitchen aspirin 81 MG tablet Take 81 mg by mouth daily.        . citalopram (CELEXA) 20 MG tablet Take 20 mg by mouth daily.      . enalapril (VASOTEC) 20 MG tablet Take 20 mg by mouth daily.      . hydrochlorothiazide (HYDRODIURIL) 25 MG tablet Take 25 mg by mouth daily.      . metoprolol (LOPRESSOR) 100 MG tablet Take 100 mg by mouth daily.      .  nitroGLYCERIN (NITROSTAT) 0.4 MG SL tablet Place 0.4 mg under the tongue every 5 (five) minutes as needed. For chest pain      . Potassium Gluconate 595 MG CAPS Take 1 capsule by mouth daily.      Marland Kitchen PRESCRIPTION MEDICATION Apply 1 application topically 2 (two) times daily. Eczema foam      . simvastatin (ZOCOR) 20 MG tablet Take 20 mg by mouth daily.      Marland Kitchen DISCONTD: amLODipine (NORVASC) 10 MG tablet Take 1 tablet (10 mg total) by mouth daily.  90 tablet  3  . DISCONTD: enalapril (VASOTEC) 20 MG tablet Take 1 tablet (20 mg total) by mouth daily.  90 tablet  3  . DISCONTD: hydrochlorothiazide (HYDRODIURIL) 25 MG tablet Take 1 tablet (25 mg total) by mouth daily.  90 tablet  3  . DISCONTD: metoprolol (LOPRESSOR) 100 MG tablet Take 1 tablet (100 mg total) by mouth daily.  90 tablet  3  . DISCONTD: nitroGLYCERIN (NITROSTAT) 0.4 MG SL tablet Place 1 tablet (0.4 mg total) under the tongue every 5 (five) minutes as needed for chest pain.  25 tablet  6  .  DISCONTD: Potassium Gluconate 595 MG CAPS Take 1 capsule (595 mg total) by mouth daily.  90 capsule  3  . DISCONTD: simvastatin (ZOCOR) 20 MG tablet Take 1 tablet (20 mg total) by mouth every evening.  90 tablet  3   Assessment: 66yom seen in the office this morning for active chest pain. His stress test from 5/7 was abnormal as well as his EKG. Heparin was started and the initial heparin level = 0.16. Hgb was 11.7 and platelets = 186. troponins are elevated. Plan for cath 5/14  Goal of Therapy:  Heparin level 0.3-0.7 units/ml Monitor platelets by anticoagulation protocol: Yes   Plan:  1) Heparin bolus 2500 units 2) Increase Heparin drip at 1300 units/hr 3) 6h heparin level 4) Daily heparin level and CBC  Dannielle Huh 02/18/2012,7:51 PM

## 2012-02-18 NOTE — H&P (Signed)
[name]   02/18/2012 10:00 AM Office Visit  MRN: [id number]   Description: 66 year old male  Provider: Norma Fredrickson, NP  Department: [facility]         Diagnoses     Chest pain   - Primary    786.50    CAD (coronary artery disease)     414.00    PVD (peripheral vascular disease)     443.9      Reason for Visit     Chest Pain    Pre cath visit. Seen for Dr. Elease Hashimoto.          Progress Notes     Norma Fredrickson, NP  02/18/2012 10:58 AM  Signed    Quita Skye Gregson Date of Birth: [birth year] Medical Record #[id number]   History of Present Illness: [name] is seen today for a pre cath visit. He is seen for Dr. Elease Hashimoto. He has known CAD with remote anterior MI in 1989, followed by CABG in 1998 (LIMA to LAD, SVG to OM, SVG to AM and SVG to PD complicated by mediastinitis).  Last cath in 2004. Has had recent Myoview which was abnormal. Other problems include tobacco abuse, HTN, HLD, depression and remote duodenal ulcer.   He comes in today. He has had recent stress testing due to an abnormal EKG last month. This has come back markedly abnormal. [name] reports at least a one year history of chest pain. It has been a substernal chest burning. He saw Dr. Deborah Chalk and it was felt to be GI in nature. He was given PPI therapy, but no real change. This is nothing like his prior chest pain syndrome. He had no chest pain with his MI, just diaphoresis. This discomfort has persisted, becoming more frequent. Always occurs at rest. He takes a BC and drinks water and gets relief after about 10 minutes. Has no NTG on hand. He has thought for the past year that it was his stomach and not his heart. He denied chest pain with his last visit with me. He does report some mild discomfort here in the office this morning. He had been referred for cath but due to chest pain in the office and the fact that his EKG has worsened, we are admitting him today.     Current Outpatient Prescriptions on File  Prior to Visit   Medication  Sig  Dispense  Refill   .  amLODipine (NORVASC) 10 MG tablet  Take 1 tablet (10 mg total) by mouth daily.   90 tablet   3   .  aspirin 81 MG tablet  Take 81 mg by mouth daily.           .  citalopram (CELEXA) 20 MG tablet  TAKE ONE TABLET BY MOUTH EVERY DAY   30 tablet   6   .  enalapril (VASOTEC) 20 MG tablet  Take 1 tablet (20 mg total) by mouth daily.   90 tablet   3   .  hydrochlorothiazide (HYDRODIURIL) 25 MG tablet  Take 1 tablet (25 mg total) by mouth daily.   90 tablet   3   .  metoprolol (LOPRESSOR) 100 MG tablet  Take 1 tablet (100 mg total) by mouth daily.   90 tablet   3   .  Potassium Gluconate 595 MG CAPS  Take 1 capsule (595 mg total) by mouth daily.   90 capsule   3   .  simvastatin (ZOCOR) 20  MG tablet  Take 1 tablet (20 mg total) by mouth every evening.   90 tablet   3   .  nitroGLYCERIN (NITROSTAT) 0.4 MG SL tablet  Place 1 tablet (0.4 mg total) under the tongue every 5 (five) minutes as needed for chest pain.   25 tablet   6      No Known Allergies    Past Medical History   Diagnosis  Date   .  IHD (ischemic heart disease)         Remote MI with CABG in 1998   .  Old MI (myocardial infarction)  1989       Anterior MI   .  Hyperlipidemia     .  Hypertension     .  Depression     .  Tobacco abuse     .  Duodenal ulcer         AGE 3   .  PVD (peripheral vascular disease)         prior extensive endarterectomy of the right external iliac, common femoral bypass, prior R CEA   .  S/P CABG (coronary artery bypass graft)  1998   .  S/P cardiac cath  2004       Grafts patent. Managed medically   .  Normal nuclear stress test  2010   .  Ischemic leg  2009       with extensive endarterectomy       Past Surgical History   Procedure  Date   .  Cardiac catheterization  05/04/2003`       EF 50-55%; Grafts patent. Managed medically   .  Coronary artery bypass graft  1998   .  Carotid endarterectomy  06/2008   .  Appendectomy     .   Cardiovascular stress test  12/14/2008       EF 52%   .  R external iliac, common femoral and profunda femoris endarterectomy  2009   .  R common femoral to popliteal bypass         History   Smoking status   .  Former Smoker -- 1.0 packs/day   .  Quit date:  01/20/2012   Smokeless tobacco   .  Not on file       History   Alcohol Use  No       Family History   Problem  Relation  Age of Onset   .  Lung disease  Mother     .  Osteoporosis  Mother     .  Lung cancer  Father     .  ALS  Brother        Review of Systems: The review of systems is positive for chest pain.  All other systems were reviewed and are negative.   Physical Exam: BP 120/60  Pulse 74  Ht 5' 8.5" (1.74 m)  Wt 190 lb 12.8 oz (86.546 kg)  BMI 28.59 kg/m2 Patient is very pleasant and in no acute distress. He does smell of tobacco but says he quit 5 weeks ago. Skin is warm and dry. Color is normal.  HEENT is unremarkable. Normocephalic/atraumatic. PERRL. Sclera are nonicteric. Neck is supple. No masses. No JVD. Lungs are clear. Cardiac exam shows a regular rate and rhythm. Abdomen is soft. Extremities are without edema. Decreased pulses noted. Gait and ROM are intact. No gross neurologic deficits noted.     LABORATORY DATA:   EKG today with worsening  ST and T wave changes inferolaterally. This tracing is reviewed with Dr. Elease Hashimoto.    Myoview Overall Impression: Study is markedly abnormal. There are significant changes since 2010. There is now a large scar that affects the entire inferior wall and much of the inferior and anterior septum. In addition there is mild ischemia in the inferolateral wall.   LV Ejection Fraction: 36%. LV Wall Motion: There is akinesis of the inferior wall and the septum.     Assessment / Plan:        CAD (coronary artery disease) - Norma Fredrickson, NP  02/18/2012 10:54 AM  Signed Patient presents with active chest pain. Has known CAD with remote CABG and now with abnormal  stress test. He was given NTG x 1 with improvement. BP 120/60. EKG is markedly abnormal and worse compared to one month ago. I have reviewed his case with Dr. Elease Hashimoto. We are going to proceed on with admission today. He will need IV Heparin. He does have extensive PVD. This may present a challenge. Patient is agreeable to this plan and will call if any problems develop in the interim.      PVD (peripheral vascular disease) - Norma Fredrickson, NP  02/18/2012 10:55 AM  Signed He has extensive PVD.       Electronic signature on 02/18/2012          Vitals - Last Recorded       BP Pulse Ht Wt BMI    120/60  74  5' 8.5" (1.74 m)  190 lb 12.8 oz (86.546 kg)  28.59 kg/m2       Vitals History Recorded       Orders Placed This Encounter     EKG 12-Lead [EKG1 Custom]       Ordered Medications         Disp Refills Start End    nitroGLYCERIN (NITROSTAT) 0.4 MG SL tablet 25 tablet 6 02/18/2012 02/17/2013    Place 1 tablet (0.4 mg total) under the tongue every 5 (five) minutes as needed for chest pain. - Sublingual         Level of Service     PR OFFICE OUTPATIENT VISIT 25 MINUTES [99214]      Follow-up and Disposition     Return in about 2 weeks (around 03/03/2012) for [facility].       Routing History Recorded        All Charges for This Encounter       Code Description Service Date Service Provider Modifiers Quantity    93000 PR ELECTROCARDIOGRAM, COMPLETE 02/18/2012 Rosalio Macadamia, NP   1    [id number] PR OFFICE OUTPATIENT VISIT 25 MINUTES 02/18/2012 Rosalio Macadamia, NP   1      Patient Instructions     We are going to admit you to the hospital.             Patient Instructions History Recorded          Previous Visit         Provider Department Encounter #    02/13/2012  4:24 PM Dietrich Pates, MD [facility] [id number]

## 2012-02-18 NOTE — Consult Note (Signed)
ANTICOAGULATION CONSULT NOTE - Initial Consult  Pharmacy Consult for Heparin Indication: chest pain/ACS  No Known Allergies  Patient Measurements: Height: 5' 8.5" (174 cm) Weight: 186 lb 11.7 oz (84.7 kg) IBW/kg (Calculated) : 69.56   Vital Signs: Temp: 97.8 F (36.6 C) (05/13 1200) Temp src: Oral (05/13 1200) BP: 120/60 mmHg (05/13 1041) Pulse Rate: 74  (05/13 1008)  Labs: No results found for this basename: HGB:2,HCT:3,PLT:3,APTT:3,LABPROT:3,INR:3,HEPARINUNFRC:3,CREATININE:3,CKTOTAL:3,CKMB:3,TROPONINI:3 in the last 72 hours  Estimated Creatinine Clearance: 63.9 ml/min (by C-G formula based on Cr of 1.2).  Medical History: Past Medical History  Diagnosis Date  . IHD (ischemic heart disease)     Remote MI with CABG in 1998  . Old MI (myocardial infarction) 1989    Anterior MI  . Hyperlipidemia   . Hypertension   . Depression   . Tobacco abuse   . Duodenal ulcer     AGE 66  . PVD (peripheral vascular disease)     prior extensive endarterectomy of the right external iliac, common femoral bypass, prior R CEA  . S/P CABG (coronary artery bypass graft) 1998  . S/P cardiac cath 2004    Grafts patent. Managed medically  . Normal nuclear stress test 2010  . Ischemic leg 2009    with extensive endarterectomy   Medications:  Prescriptions prior to admission  Medication Sig Dispense Refill  . amLODipine (NORVASC) 10 MG tablet Take 1 tablet (10 mg total) by mouth daily.  90 tablet  3  . aspirin 81 MG tablet Take 81 mg by mouth daily.        . citalopram (CELEXA) 20 MG tablet TAKE ONE TABLET BY MOUTH EVERY DAY  30 tablet  6  . enalapril (VASOTEC) 20 MG tablet Take 1 tablet (20 mg total) by mouth daily.  90 tablet  3  . hydrochlorothiazide (HYDRODIURIL) 25 MG tablet Take 1 tablet (25 mg total) by mouth daily.  90 tablet  3  . metoprolol (LOPRESSOR) 100 MG tablet Take 1 tablet (100 mg total) by mouth daily.  90 tablet  3  . nitroGLYCERIN (NITROSTAT) 0.4 MG SL tablet Place 1  tablet (0.4 mg total) under the tongue every 5 (five) minutes as needed for chest pain.  25 tablet  6  . Potassium Gluconate 595 MG CAPS Take 1 capsule (595 mg total) by mouth daily.  90 capsule  3  . simvastatin (ZOCOR) 20 MG tablet Take 1 tablet (20 mg total) by mouth every evening.  90 tablet  3   Assessment: 65yom seen in the office this morning for active chest pain. His stress test from 5/7 was abnormal as well as his EKG. He will begin heparin and plan for cath. No labs for this admission yet, but sCr from April was 1.2.  Goal of Therapy:  Heparin level 0.3-0.7 units/ml Monitor platelets by anticoagulation protocol: Yes   Plan:  1) Heparin bolus 4000 units x 1 2) Heparin drip at 1000 units/hr 3) 6h heparin level 4) Daily heparin level and CBC  Fredrik Rigger 02/18/2012,12:07 PM

## 2012-02-18 NOTE — Progress Notes (Signed)
CRITICAL VALUE ALERT  Critical value received:  Troponin .33  Date of notification:  02/18/2012  Time of notification:  1318  Critical value read back:yes  Nurse who received alert:  Floydene Flock, RN  MD notified (1st page):  Theodore Demark, PA  Time of first page: 1318  MD notified (2nd page):  Time of second page:  Responding MD: Theodore Demark, PA  Time MD responded: [phone]

## 2012-02-18 NOTE — H&P (Signed)
[name]   02/18/2012 10:00 AM Office Visit  MRN: [id number]   Description: 66 year old male  Provider: Norma Fredrickson, NP  Department: [facility]         Diagnoses     Chest pain   - Primary    786.50    CAD (coronary artery disease)     414.00    PVD (peripheral vascular disease)     443.9      Reason for Visit     Chest Pain    Pre cath visit. Seen for Dr. Elease Hashimoto.          Progress Notes     Norma Fredrickson, NP  02/18/2012 10:58 AM  Signed    Quita Skye Maybury Date of Birth: 02/23/1946 Medical Record #[id number]   History of Present Illness: [name] is seen today for a pre cath visit. He is seen for Dr. Elease Hashimoto. He has known CAD with remote anterior MI in 1989, followed by CABG in 1998 (LIMA to LAD, SVG to OM, SVG to AM and SVG to PD complicated by mediastinitis).  Last cath in 2004. Has had recent Myoview which was abnormal. Other problems include tobacco abuse, HTN, HLD, depression and remote duodenal ulcer.   He comes in today. He has had recent stress testing due to an abnormal EKG last month. This has come back markedly abnormal. [name] reports at least a one year history of chest pain. It has been a substernal chest burning. He saw Dr. Deborah Chalk and it was felt to be GI in nature. He was given PPI therapy, but no real change. This is nothing like his prior chest pain syndrome. He had no chest pain with his MI, just diaphoresis. This discomfort has persisted, becoming more frequent. Always occurs at rest. He takes a BC and drinks water and gets relief after about 10 minutes. Has no NTG on hand. He has thought for the past year that it was his stomach and not his heart. He denied chest pain with his last visit with me. He does report some mild discomfort here in the office this morning. He had been referred for cath but due to chest pain in the office and the fact that his EKG has worsened, we are admitting him today.     Current Outpatient Prescriptions on File  Prior to Visit   Medication  Sig  Dispense  Refill   .  amLODipine (NORVASC) 10 MG tablet  Take 1 tablet (10 mg total) by mouth daily.   90 tablet   3   .  aspirin 81 MG tablet  Take 81 mg by mouth daily.           .  citalopram (CELEXA) 20 MG tablet  TAKE ONE TABLET BY MOUTH EVERY DAY   30 tablet   6   .  enalapril (VASOTEC) 20 MG tablet  Take 1 tablet (20 mg total) by mouth daily.   90 tablet   3   .  hydrochlorothiazide (HYDRODIURIL) 25 MG tablet  Take 1 tablet (25 mg total) by mouth daily.   90 tablet   3   .  metoprolol (LOPRESSOR) 100 MG tablet  Take 1 tablet (100 mg total) by mouth daily.   90 tablet   3   .  Potassium Gluconate 595 MG CAPS  Take 1 capsule (595 mg total) by mouth daily.   90 capsule   3   .  simvastatin (ZOCOR) 20  MG tablet  Take 1 tablet (20 mg total) by mouth every evening.   90 tablet   3   .  nitroGLYCERIN (NITROSTAT) 0.4 MG SL tablet  Place 1 tablet (0.4 mg total) under the tongue every 5 (five) minutes as needed for chest pain.   25 tablet   6      No Known Allergies    Past Medical History   Diagnosis  Date   .  IHD (ischemic heart disease)         Remote MI with CABG in 1998   .  Old MI (myocardial infarction)  1989       Anterior MI   .  Hyperlipidemia     .  Hypertension     .  Depression     .  Tobacco abuse     .  Duodenal ulcer         AGE 6   .  PVD (peripheral vascular disease)         prior extensive endarterectomy of the right external iliac, common femoral bypass, prior R CEA   .  S/P CABG (coronary artery bypass graft)  1998   .  S/P cardiac cath  2004       Grafts patent. Managed medically   .  Normal nuclear stress test  2010   .  Ischemic leg  2009       with extensive endarterectomy       Past Surgical History   Procedure  Date   .  Cardiac catheterization  05/04/2003`       EF 50-55%; Grafts patent. Managed medically   .  Coronary artery bypass graft  1998   .  Carotid endarterectomy  06/2008   .  Appendectomy     .   Cardiovascular stress test  12/14/2008       EF 52%   .  R external iliac, common femoral and profunda femoris endarterectomy  2009   .  R common femoral to popliteal bypass         History   Smoking status   .  Former Smoker -- 1.0 packs/day   .  Quit date:  01/20/2012   Smokeless tobacco   .  Not on file       History   Alcohol Use  No       Family History   Problem  Relation  Age of Onset   .  Lung disease  Mother     .  Osteoporosis  Mother     .  Lung cancer  Father     .  ALS  Brother        Review of Systems: The review of systems is positive for chest pain.  All other systems were reviewed and are negative.   Physical Exam: BP 120/60  Pulse 74  Ht 5' 8.5" (1.74 m)  Wt 190 lb 12.8 oz (86.546 kg)  BMI 28.59 kg/m2 Patient is very pleasant and in no acute distress. He does smell of tobacco but says he quit 5 weeks ago. Skin is warm and dry. Color is normal.  HEENT is unremarkable. Normocephalic/atraumatic. PERRL. Sclera are nonicteric. Neck is supple. No masses. No JVD. Lungs are clear. Cardiac exam shows a regular rate and rhythm. Abdomen is soft. Extremities are without edema. Decreased pulses noted. Gait and ROM are intact. No gross neurologic deficits noted.     LABORATORY DATA:   EKG today with worsening  ST and T wave changes inferolaterally. This tracing is reviewed with Dr. Elease Hashimoto.    Myoview Overall Impression: Study is markedly abnormal. There are significant changes since 2010. There is now a large scar that affects the entire inferior wall and much of the inferior and anterior septum. In addition there is mild ischemia in the inferolateral wall.   LV Ejection Fraction: 36%. LV Wall Motion: There is akinesis of the inferior wall and the septum.     Assessment / Plan:        CAD (coronary artery disease) - Norma Fredrickson, NP  02/18/2012 10:54 AM  Signed Patient presents with active chest pain. Has known CAD with remote CABG and now with abnormal  stress test. He was given NTG x 1 with improvement. BP 120/60. EKG is markedly abnormal and worse compared to one month ago. I have reviewed his case with Dr. Elease Hashimoto. We are going to proceed on with admission today. He will need IV Heparin. He does have extensive PVD. This may present a challenge. Patient is agreeable to this plan and will call if any problems develop in the interim.      PVD (peripheral vascular disease) - Norma Fredrickson, NP  02/18/2012 10:55 AM  Signed He has extensive PVD.     Pt returns after having significant chest pain.   His ECG abnormalities have progressed.  Myoview shows some new abnormalities and an EF of 35%.  I agree with admission to the hospital.  He will need a cath today or tomorrow.

## 2012-02-18 NOTE — Patient Instructions (Addendum)
We are going to admit you to the hospital.  

## 2012-02-18 NOTE — Progress Notes (Signed)
[name] Date of Birth: 08/06/1946 Medical Record #[id number]  History of Present Illness: [name] is seen today for a pre cath visit. He is seen for Dr. Elease Hashimoto. He has known CAD with remote anterior MI in 1989, followed by CABG in 1998 (LIMA to LAD, SVG to OM, SVG to AM and SVG to PD complicated by mediastinitis).  Last cath in 2004. Has had recent Myoview which was abnormal. Other problems include tobacco abuse, HTN, HLD, depression and remote duodenal ulcer.  He comes in today. He has had recent stress testing due to an abnormal EKG last month. This has come back markedly abnormal. [name] reports at least a one year history of chest pain. It has been a substernal chest burning. He saw Dr. Deborah Chalk and it was felt to be GI in nature. He was given PPI therapy, but no real change. This is nothing like his prior chest pain syndrome. He had no chest pain with his MI, just diaphoresis. This discomfort has persisted, becoming more frequent. Always occurs at rest. Has no NTG on hand. He has thought for the past year that it was his stomach and not his heart. He denied chest pain with his last visit with me. He does report some mild discomfort here in the office this morning. He had been referred for cath but due to chest pain in the office and the fact that his EKG has worsened, we are admitting him today.   Current Outpatient Prescriptions on File Prior to Visit  Medication Sig Dispense Refill  . amLODipine (NORVASC) 10 MG tablet Take 1 tablet (10 mg total) by mouth daily.  90 tablet  3  . aspirin 81 MG tablet Take 81 mg by mouth daily.        . citalopram (CELEXA) 20 MG tablet TAKE ONE TABLET BY MOUTH EVERY DAY  30 tablet  6  . enalapril (VASOTEC) 20 MG tablet Take 1 tablet (20 mg total) by mouth daily.  90 tablet  3  . hydrochlorothiazide (HYDRODIURIL) 25 MG tablet Take 1 tablet (25 mg total) by mouth daily.  90 tablet  3  . metoprolol (LOPRESSOR) 100 MG tablet Take 1 tablet (100 mg total) by mouth  daily.  90 tablet  3  . Potassium Gluconate 595 MG CAPS Take 1 capsule (595 mg total) by mouth daily.  90 capsule  3  . simvastatin (ZOCOR) 20 MG tablet Take 1 tablet (20 mg total) by mouth every evening.  90 tablet  3  . nitroGLYCERIN (NITROSTAT) 0.4 MG SL tablet Place 1 tablet (0.4 mg total) under the tongue every 5 (five) minutes as needed for chest pain.  25 tablet  6    No Known Allergies  Past Medical History  Diagnosis Date  . IHD (ischemic heart disease)     Remote MI with CABG in 1998  . Old MI (myocardial infarction) 1989    Anterior MI  . Hyperlipidemia   . Hypertension   . Depression   . Tobacco abuse   . Duodenal ulcer     AGE 66  . PVD (peripheral vascular disease)     prior extensive endarterectomy of the right external iliac, common femoral bypass, prior R CEA  . S/P CABG (coronary artery bypass graft) 1998  . S/P cardiac cath 2004    Grafts patent. Managed medically  . Normal nuclear stress test 2010  . Ischemic leg 2009    with extensive endarterectomy    Past Surgical History  Procedure Date  . Cardiac catheterization 05/04/2003`    EF 50-55%; Grafts patent. Managed medically  . Coronary artery bypass graft 1998  . Carotid endarterectomy 06/2008  . Appendectomy   . Cardiovascular stress test 12/14/2008    EF 52%  . R external iliac, common femoral and profunda femoris endarterectomy 2009  . R common femoral to popliteal bypass     History  Smoking status  . Former Smoker -- 1.0 packs/day  . Quit date: 01/20/2012  Smokeless tobacco  . Not on file    History  Alcohol Use No    Family History  Problem Relation Age of Onset  . Lung disease Mother   . Osteoporosis Mother   . Lung cancer Father   . ALS Brother     Review of Systems: The review of systems is positive for chest pain.  All other systems were reviewed and are negative.  Physical Exam: BP 120/60  Pulse 74  Ht 5' 8.5" (1.74 m)  Wt 190 lb 12.8 oz (86.546 kg)  BMI 28.59  kg/m2 Patient is very pleasant and in no acute distress. He does smell of tobacco but says he quit 5 weeks ago. Skin is warm and dry. Color is normal.  HEENT is unremarkable. Normocephalic/atraumatic. PERRL. Sclera are nonicteric. Neck is supple. No masses. No JVD. Lungs are clear. Cardiac exam shows a regular rate and rhythm. Abdomen is soft. Extremities are without edema. Decreased pulses noted. Gait and ROM are intact. No gross neurologic deficits noted.   LABORATORY DATA:  EKG today with worsening ST and T wave changes inferolaterally. This tracing is reviewed with Dr. Elease Hashimoto.   Myoview Overall Impression: Study is markedly abnormal. There are significant changes since 2010. There is now a large scar that affects the entire inferior wall and much of the inferior and anterior septum. In addition there is mild ischemia in the inferolateral wall.  LV Ejection Fraction: 36%. LV Wall Motion: There is akinesis of the inferior wall and the septum.   Assessment / Plan:

## 2012-02-18 NOTE — Assessment & Plan Note (Signed)
Patient presents with active chest pain. Has known CAD with remote CABG and now with abnormal stress test. He was given NTG x 1 with improvement. BP 120/60. EKG is markedly abnormal and worse compared to one month ago. I have reviewed his case with Dr. Elease Hashimoto. We are going to proceed on with admission today. He will need IV Heparin. He does have extensive PVD. This may present a challenge. Patient is agreeable to this plan and will call if any problems develop in the interim.

## 2012-02-18 NOTE — Care Management Note (Signed)
    Page 1 of 1   02/18/2012     1:17:20 PM   CARE MANAGEMENT NOTE 02/18/2012  Patient:  JADER, DESAI   Account Number:  [id number]  Date Initiated:  02/18/2012  Documentation initiated by:  Junius Creamer  Subjective/Objective Assessment:   adm w mi     Action/Plan:   lives alone, no pcp listed   Anticipated DC Date:  02/20/2012   Anticipated DC Plan:  HOME/SELF CARE      DC Planning Services  CM consult      Choice offered to / List presented to:             Status of service:   Medicare Important Message given?   (If response is "NO", the following Medicare IM given date fields will be blank) Date Medicare IM given:   Date Additional Medicare IM given:    Discharge Disposition:    Per UR Regulation:  Reviewed for med. necessity/level of care/duration of stay  If discussed at Long Length of Stay Meetings, dates discussed:    Comments:  02/18/12 13:15p debbie Presley [place] rn,bsn [phone]

## 2012-02-19 ENCOUNTER — Encounter (HOSPITAL_COMMUNITY)
Admission: AD | Disposition: A | Discharge status: Home / Self Care | Payer: Self-pay | Source: Ambulatory Visit | Attending: Cardiovascular Disease

## 2012-02-19 ENCOUNTER — Ambulatory Visit (HOSPITAL_COMMUNITY): Admit: 2012-02-19 | Payer: Medicare Other | Admitting: Cardiology

## 2012-02-19 DIAGNOSIS — R079 Chest pain, unspecified: Secondary | ICD-10-CM

## 2012-02-19 DIAGNOSIS — I25810 Atherosclerosis of coronary artery bypass graft(s) without angina pectoris: Secondary | ICD-10-CM

## 2012-02-19 DIAGNOSIS — Z720 Tobacco use: Secondary | ICD-10-CM | POA: Diagnosis present

## 2012-02-19 DIAGNOSIS — I214 Non-ST elevation (NSTEMI) myocardial infarction: Secondary | ICD-10-CM

## 2012-02-19 HISTORY — PX: LEFT HEART CATHETERIZATION WITH CORONARY/GRAFT ANGIOGRAM: SHX5450

## 2012-02-19 LAB — CBC
HCT: 32.5 % — ABNORMAL LOW (ref 39.0–52.0)
Hemoglobin: 11.3 g/dL — ABNORMAL LOW (ref 13.0–17.0)
MCHC: 34.8 g/dL (ref 30.0–36.0)
RBC: 3.5 MIL/uL — ABNORMAL LOW (ref 4.22–5.81)
WBC: 10.2 10*3/uL (ref 4.0–10.5)

## 2012-02-19 LAB — LIPID PANEL
Cholesterol: 123 mg/dL (ref 0–200)
HDL: 30 mg/dL — ABNORMAL LOW (ref >39)
LDL Cholesterol: 72 mg/dL (ref 0–99)
Triglycerides: 105 mg/dL (ref <150)
VLDL: 21 mg/dL (ref 0–40)

## 2012-02-19 LAB — CARDIAC PANEL(CRET KIN+CKTOT+MB+TROPI)
CK, MB: 3.7 ng/mL (ref 0.3–4.0)
Relative Index: INVALID (ref 0.0–2.5)
Total CK: 86 U/L (ref 7–232)
Troponin I: 0.52 ng/mL (ref <0.30)

## 2012-02-19 LAB — BASIC METABOLIC PANEL
BUN: 18 mg/dL (ref 6–23)
Chloride: 99 mEq/L (ref 96–112)
GFR calc Af Amer: 88 mL/min — ABNORMAL LOW (ref >90)
GFR calc non Af Amer: 76 mL/min — ABNORMAL LOW (ref >90)
Potassium: 4 mEq/L (ref 3.5–5.1)
Sodium: 135 mEq/L (ref 135–145)

## 2012-02-19 LAB — HEPARIN LEVEL (UNFRACTIONATED): Heparin Unfractionated: 0.56 IU/mL (ref 0.30–0.70)

## 2012-02-19 SURGERY — LEFT HEART CATHETERIZATION WITH CORONARY/GRAFT ANGIOGRAM
Anesthesia: LOCAL

## 2012-02-19 MED ORDER — MORPHINE SULFATE 4 MG/ML IJ SOLN
INTRAMUSCULAR | Status: AC
  Filled 2012-02-19: qty 1

## 2012-02-19 MED ORDER — SIMVASTATIN 20 MG PO TABS: 20.0000 mg | ORAL_TABLET | Freq: Every day | ORAL | Status: DC

## 2012-02-19 MED ORDER — BIVALIRUDIN 250 MG IV SOLR
INTRAVENOUS | Status: AC
  Filled 2012-02-19: qty 250

## 2012-02-19 MED ORDER — HYDROCHLOROTHIAZIDE 25 MG PO TABS: 25.0000 mg | ORAL_TABLET | Freq: Every day | ORAL | Status: DC

## 2012-02-19 MED ORDER — DIAZEPAM 5 MG PO TABS
ORAL_TABLET | ORAL | Status: AC
  Filled 2012-02-19: qty 1

## 2012-02-19 MED ORDER — MORPHINE SULFATE 10 MG/ML IJ SOLN
2.0000 mg | INTRAMUSCULAR | Status: DC | PRN
  Administered 2012-02-19: 2 mg via INTRAVENOUS

## 2012-02-19 MED ORDER — MORPHINE SULFATE 10 MG/ML IJ SOLN: 2.0000 mg | INTRAMUSCULAR | Status: DC | PRN

## 2012-02-19 MED ORDER — HEPARIN (PORCINE) IN NACL 2-0.9 UNIT/ML-% IJ SOLN
INTRAMUSCULAR | Status: AC
  Filled 2012-02-19: qty 2000

## 2012-02-19 MED ORDER — ENALAPRIL MALEATE 20 MG PO TABS: 20.0000 mg | ORAL_TABLET | Freq: Every day | ORAL | Status: DC

## 2012-02-19 MED ORDER — CITALOPRAM HYDROBROMIDE 20 MG PO TABS: 20.0000 mg | ORAL_TABLET | Freq: Every day | ORAL | Status: DC

## 2012-02-19 MED ORDER — LIDOCAINE HCL (PF) 1 % IJ SOLN
INTRAMUSCULAR | Status: AC
  Filled 2012-02-19: qty 30

## 2012-02-19 MED ORDER — OXYCODONE-ACETAMINOPHEN 5-325 MG PO TABS
ORAL_TABLET | ORAL | Status: AC
  Filled 2012-02-19: qty 2

## 2012-02-19 MED ORDER — OXYCODONE-ACETAMINOPHEN 5-325 MG PO TABS
2.0000 | ORAL_TABLET | Freq: Four times a day (QID) | ORAL | Status: DC | PRN
  Administered 2012-02-19 – 2012-02-20 (×2): 2 via ORAL
  Filled 2012-02-19: qty 2

## 2012-02-19 MED ORDER — MIDAZOLAM HCL 2 MG/2ML IJ SOLN
INTRAMUSCULAR | Status: AC
  Filled 2012-02-19: qty 2

## 2012-02-19 MED ORDER — CLOPIDOGREL BISULFATE 300 MG PO TABS
ORAL_TABLET | ORAL | Status: AC
  Filled 2012-02-19: qty 2

## 2012-02-19 MED ORDER — ASPIRIN EC 325 MG PO TBEC
325.0000 mg | DELAYED_RELEASE_TABLET | Freq: Every day | ORAL | Status: DC
  Administered 2012-02-20: 325 mg via ORAL
  Filled 2012-02-19 (×2): qty 1

## 2012-02-19 MED ORDER — SODIUM CHLORIDE 0.9 % IV SOLN
1.0000 mL/kg/h | INTRAVENOUS | Status: AC
  Administered 2012-02-19: 1 mL/kg/h via INTRAVENOUS

## 2012-02-19 MED ORDER — AMLODIPINE BESYLATE 10 MG PO TABS: 10.0000 mg | ORAL_TABLET | Freq: Every day | ORAL | Status: DC

## 2012-02-19 MED ORDER — CLOPIDOGREL BISULFATE 75 MG PO TABS
75.0000 mg | ORAL_TABLET | Freq: Every day | ORAL | Status: DC
  Administered 2012-02-20: 75 mg via ORAL

## 2012-02-19 MED ORDER — FENTANYL CITRATE 0.05 MG/ML IJ SOLN
INTRAMUSCULAR | Status: AC
  Filled 2012-02-19: qty 2

## 2012-02-19 MED ORDER — NITROGLYCERIN 0.4 MG SL SUBL: 0.4000 mg | SUBLINGUAL_TABLET | SUBLINGUAL | Status: DC | PRN

## 2012-02-19 MED ORDER — METOPROLOL TARTRATE 100 MG PO TABS
100.0000 mg | ORAL_TABLET | Freq: Every day | ORAL | Status: DC
  Administered 2012-02-20: 100 mg via ORAL
  Filled 2012-02-19: qty 1

## 2012-02-19 MED ORDER — NITROGLYCERIN 0.2 MG/ML ON CALL CATH LAB
INTRAVENOUS | Status: AC
  Filled 2012-02-19: qty 1

## 2012-02-19 NOTE — Interval H&P Note (Signed)
History and Physical Interval Note:  02/19/2012 10:01 AM  [name]  has presented today for surgery, with the diagnosis of abnormal stress  The various methods of treatment have been discussed with the patient and family. After consideration of risks, benefits and other options for treatment, the patient has consented to  Procedure(s) (LRB): LEFT HEART CATHETERIZATION WITH CORONARY/GRAFT ANGIOGRAM (N/A) as a surgical intervention .  The patients' history has been reviewed, patient examined, no change in status, stable for surgery.  I have reviewed the patients' chart and labs.  Questions were answered to the patient's satisfaction.  The patient has prior right leg revascularization and has a good right femoral pulse. Left femoral and pedal pulses are absent. He reports left leg claudication. Will plan on using right femoral access.   Theron Arista [facility] 02/19/2012 10:03 AM

## 2012-02-19 NOTE — Progress Notes (Signed)
ANTICOAGULATION CONSULT NOTE - Follow Up Consult  Pharmacy Consult for heparin Indication: chest pain/ACS  Labs:  Basename 02/19/12 0202 02/18/12 2335 02/18/12 1845 02/18/12 1741 02/18/12 1205  HGB 11.3* -- -- -- 11.7*  HCT 32.5* -- -- -- 33.8*  PLT 176 -- -- -- 186  APTT -- -- -- -- 36  LABPROT -- -- -- -- 14.0  INR -- -- -- -- 1.06  HEPARINUNFRC 0.56 -- 0.16* -- --  CREATININE 1.00 -- -- -- 0.89  CKTOTAL -- 86 -- 96 93  CKMB -- 3.7 -- 4.0 3.8  TROPONINI -- 0.52* -- 0.37* 0.33*    Assessment/Plan: 66yo male now therapeutic on heparin for CP.  Will continue gtt at current rate and confirm stable with additional level.   Colleen Can PharmD BCPS 02/19/2012,3:08 AM

## 2012-02-19 NOTE — Progress Notes (Signed)
Site area: right groin  Site Prior to Removal:  Level 0  Pressure Applied For 29 MINUTES    Minutes Beginning at 1335  Manual:   yes  Patient Status During Pull:  stable  Post Pull Groin Site:  Level 0  Post Pull Instructions Given:  yes  Post Pull Pulses Present:  yes  Dressing Applied:  yes  Comments:

## 2012-02-19 NOTE — CV Procedure (Signed)
Cardiac Catheterization Procedure Note  Name: [name] MRN: [id number] DOB: [birth year]  Procedure: Left Heart Cath, Selective Coronary Angiography, saphenous vein graft angiography x3, LIMA graft angiography LV angiography,  PTCA/Stent of saphenous vein graft to the obtuse marginal vessel.  Indication: 66 year old white male with history of coronary disease status post CABG in 1998 who presents with a non-ST elevation myocardial infarction.   Diagnostic Procedure Details: The right groin was prepped, draped, and anesthetized with 1% lidocaine. Using the modified Seldinger technique, a 5 French sheath was introduced into the right femoral artery. Standard Judkins catheters were used for selective coronary angiography, graft angiography, and left ventriculography. Catheter exchanges were performed over a wire.  The diagnostic procedure was well-tolerated without immediate complications.  PROCEDURAL FINDINGS Hemodynamics: AO 130/65 with a mean of 90 mm mercury LV 130/29 mmHg  Coronary angiography: Coronary dominance: right  Left mainstem: There is a 70% distal left main stenosis.  Left anterior descending (LAD): The LAD is heavily calcified and is occluded at the ostium.  Left circumflex (LCx): The left circumflex coronary is occluded in the mid vessel. The first obtuse marginal vessel is without significant disease.  Right coronary artery (RCA): The right coronary is occluded proximally.  Saphenous vein graft to the second obtuse marginal vessel is a large graft. There is a 99% ostial stenosis. With catheter engagement there is severe dampening of pressures. After the touchdown of the graft there is a 60-70% stenosis in the obtuse marginal vessel.  The saphenous vein graft to the acute marginal is a small caliber graft with 30-40% disease in the proximal graft. It has good runoff.  The saphenous vein graft to the PDA is occluded proximally.  The LIMA graft to the LAD is a large  graft is widely patent throughout. Supplies the entire LAD and diagonal system. The PDA fills by left-to-right collaterals.  Left ventriculography: Left ventricular systolic function is abnormal. There is inferior wall akinesis with some global hypokinesis. Overall ejection fraction is estimated at 35%. There is mild mitral insufficiency.  PCI Procedure Note:  Following the diagnostic procedure, the decision was made to proceed with PCI. The sheath was upsized to a 6 [place]. Weight-based bivalirudin was given for anticoagulation. Plavix 600 mg by mouth was given as a loading dose. Once a therapeutic ACT was achieved, a 6 [place] JR 4 guide catheter with sideholes was inserted.  A pro-water coronary guidewire was used to cross the lesion.  The lesion was predilated with a 2.5 mm compliant balloon.  The lesion was then stented with a 3.5 x 15 mm Resolute  stent.  The stent was postdilated with a 3.75 mm noncompliant balloon.  Following PCI, there was 0% residual stenosis and TIMI-3 flow. Final angiography confirmed an excellent result. Femoral hemostasis was achieved with manual compression.  The patient tolerated the PCI procedure well. There were no immediate procedural complications.  The patient was transferred to the post catheterization recovery area for further monitoring.  PCI Data: Vessel - saphenous vein graft to the obtuse marginal vessel/Segment - ostial Percent Stenosis (pre)  99% TIMI-flow 2  Stent 3.5 x 15 mm Resolute stent Percent Stenosis (post) 0% TIMI-flow (post) 3  Final Conclusions:   1. Severe three-vessel obstructive coronary disease. 2. Patent LIMA graft to LAD. 3. Patent saphenous vein graft to the acute marginal branch. 4. Occluded saphenous vein graft to the PDA 5. Severe ostial stenosis in the saphenous vein graft to the obtuse marginal vessel. 6. Moderate to severe left  ventricular dysfunction. 7. Successful intracoronary stenting of the saphenous vein graft to the  obtuse marginal vessel with a drug-eluting stent.  Recommendations: Continue dual antiplatelet therapy for one year. Anticipate discharge in the morning if no complications.  [name] 02/19/2012, 11:19 AM

## 2012-02-19 NOTE — H&P (View-Only) (Signed)
 [facility] Resident Note  Subjective:  Patient denies any chest pain, SOB, abdominal pain but noted some congestion.   Objective:  Vital Signs in the last 24 hours: Filed Vitals:   02/19/12 0300 02/19/12 0400 02/19/12 0500 02/19/12 0600  BP: 114/57 129/61 126/68 120/60  Pulse: 66 71 80   Temp:  99.3 F (37.4 C)    TempSrc:  Oral    Resp: 16 15 16 21  Height:      Weight:      SpO2: 95% 95% 94% 95%    Intake/Output from previous day: 05/13 0701 - 05/14 0700 In: 1299.8 [P.O.:630; I.V.:669.8] Out: 2025 [Urine:2025]  24-hour weight change: Weight change:   Weight trends: Filed Weights   02/18/12 1149 02/18/12 1232  Weight: 186 lb 11.7 oz (84.7 kg) 186 lb 11.7 oz (84.7 kg)    Physical Exam: Pt is alert and oriented, NAD Neck: no JVD Lungs: CTA bilaterally CV: RRR without murmur or gallop Abd: soft, NT, Positive BS,  Ext: no C/C/E, distal pulses intact and equal   Lab Results:  Basename 02/19/12 0202 02/18/12 1205  WBC 10.2 10.0  HGB 11.3* 11.7*  PLT 176 186    Basename 02/19/12 0202 02/18/12 1205  NA 135 134*  K 4.0 3.7  CL 99 99  CO2 25 24  GLUCOSE 92 103*  BUN 18 18  CREATININE 1.00 0.89    Basename 02/18/12 2335 02/18/12 1741  TROPONINI 0.52* 0.37*    Tele: NSR with PAC   Scheduled Meds:   . amLODipine  10 mg Oral Daily  . aspirin  324 mg Oral NOW   Or  . aspirin  300 mg Rectal NOW  . aspirin  324 mg Oral Pre-Cath  . aspirin EC  81 mg Oral Daily  . citalopram  20 mg Oral Daily  . diazepam  10 mg Oral Pre-Cath  . enalapril  20 mg Oral Daily  . heparin  2,500 Units Intravenous Once  . heparin  4,000 Units Intravenous Once  . hydrochlorothiazide  25 mg Oral Daily  . metoprolol tartrate  50 mg Oral BID  . pneumococcal 23 valent vaccine  0.5 mL Intramuscular Tomorrow-1000  . simvastatin  20 mg Oral q1800  . sodium chloride  3 mL Intravenous Q12H  . DISCONTD: aspirin  324 mg Oral Pre-Cath  . DISCONTD: aspirin EC  81  mg Oral Daily  . DISCONTD: diazepam  10 mg Oral On Call   Continuous Infusions:   . sodium chloride 20 mL/hr at 02/18/12 2000  . sodium chloride 50 mL/hr at 02/19/12 0400  . heparin 1,300 Units/hr (02/19/12 0236)  . nitroGLYCERIN 10 mcg/min (02/18/12 2000)  . DISCONTD: heparin 1,000 Units/hr (02/18/12 1256)   PRN Meds:.sodium chloride, acetaminophen, nitroGLYCERIN, ondansetron (ZOFRAN) IV, sodium chloride  Imaging: Portable Chest X-ray 1 View  02/18/2012  *RADIOLOGY REPORT*  Clinical Data: Chest pain.  Preop evaluation  PORTABLE CHEST - 1 VIEW  Comparison: 07/07/2008  Findings: Cardiac enlargement.  Changes of CABG.  Negative for heart failure.  Lungs are clear without infiltrate or mass lesion. Probable COPD.  IMPRESSION: Cardiac enlargement without acute cardiopulmonary disease.  Original Report Authenticated By: DAVID C. CLARK, M.D.   Assessment/Plan:   Pt is a 65 y.o. yo male with a PMHx of CAD s/p CABG  who was admitted on 02/18/2012 for chest pain, abnormal stress test .   1. CAD: Elevated Trop. ECG changes present.  Abnormal Stress test .  Currently   on Heparin, Nitro, Aspirin , Vasotec, Lopressor, Simvastatin, Hydrochlorthiazide .  - Cath today  2. HTN; Well controlled. Currently on  Vasotec, Lopressor, Hydrochlorthiazide .   3. HLD: :DL of 72. Currently on Simvastatin   Length of Stay: 1 days  Patient history and plan of care reviewed with attending, Dr. Jaidev Sanger  ILLATH,JASEELA, M.D. 02/19/2012, 7:24 AM  Attending Note:   The patient was seen and examined.  Agree with assessment and plan as noted above.  Pt is feeling better this am.  Plan is for cath today. Enzymes are  Mildly positive.  Cath with possible pci today.  Aivy Akter J. Graciemae Delisle, Jr., MD, FACC 02/19/2012, 8:23 AM    

## 2012-02-19 NOTE — Progress Notes (Signed)
[facility] Resident Note  Subjective:  Patient denies any chest pain, SOB, abdominal pain but noted some congestion.   Objective:  Vital Signs in the last 24 hours: Filed Vitals:   02/19/12 0300 02/19/12 0400 02/19/12 0500 02/19/12 0600  BP: 114/57 129/61 126/68 120/60  Pulse: 66 71 80   Temp:  99.3 F (37.4 C)    TempSrc:  Oral    Resp: 16 15 16 21   Height:      Weight:      SpO2: 95% 95% 94% 95%    Intake/Output from previous day: 05/13 0701 - 05/14 0700 In: 1299.8 [P.O.:630; I.V.:669.8] Out: 2025 [Urine:2025]  24-hour weight change: Weight change:   Weight trends: Filed Weights   02/18/12 1149 02/18/12 1232  Weight: 186 lb 11.7 oz (84.7 kg) 186 lb 11.7 oz (84.7 kg)    Physical Exam: Pt is alert and oriented, NAD Neck: no JVD Lungs: CTA bilaterally CV: RRR without murmur or gallop Abd: soft, NT, Positive BS,  Ext: no C/C/E, distal pulses intact and equal   Lab Results:  Basename 02/19/12 0202 02/18/12 1205  WBC 10.2 10.0  HGB 11.3* 11.7*  PLT 176 186    Basename 02/19/12 0202 02/18/12 1205  NA 135 134*  K 4.0 3.7  CL 99 99  CO2 25 24  GLUCOSE 92 103*  BUN 18 18  CREATININE 1.00 0.89    Basename 02/18/12 2335 02/18/12 1741  TROPONINI 0.52* 0.37*    Tele: NSR with PAC   Scheduled Meds:   . amLODipine  10 mg Oral Daily  . aspirin  324 mg Oral NOW   Or  . aspirin  300 mg Rectal NOW  . aspirin  324 mg Oral Pre-Cath  . aspirin EC  81 mg Oral Daily  . citalopram  20 mg Oral Daily  . diazepam  10 mg Oral Pre-Cath  . enalapril  20 mg Oral Daily  . heparin  2,500 Units Intravenous Once  . heparin  4,000 Units Intravenous Once  . hydrochlorothiazide  25 mg Oral Daily  . metoprolol tartrate  50 mg Oral BID  . pneumococcal 23 valent vaccine  0.5 mL Intramuscular Tomorrow-1000  . simvastatin  20 mg Oral q1800  . sodium chloride  3 mL Intravenous Q12H  . DISCONTD: aspirin  324 mg Oral Pre-Cath  . DISCONTD: aspirin EC  81  mg Oral Daily  . DISCONTD: diazepam  10 mg Oral On Call   Continuous Infusions:   . sodium chloride 20 mL/hr at 02/18/12 2000  . sodium chloride 50 mL/hr at 02/19/12 0400  . heparin 1,300 Units/hr (02/19/12 0236)  . nitroGLYCERIN 10 mcg/min (02/18/12 2000)  . DISCONTD: heparin 1,000 Units/hr (02/18/12 1256)   PRN Meds:.sodium chloride, acetaminophen, nitroGLYCERIN, ondansetron (ZOFRAN) IV, sodium chloride  Imaging: Portable Chest X-ray 1 View  02/18/2012  *RADIOLOGY REPORT*  Clinical Data: Chest pain.  Preop evaluation  PORTABLE CHEST - 1 VIEW  Comparison: 07/07/2008  Findings: Cardiac enlargement.  Changes of CABG.  Negative for heart failure.  Lungs are clear without infiltrate or mass lesion. Probable COPD.  IMPRESSION: Cardiac enlargement without acute cardiopulmonary disease.  Original Report Authenticated By: Camelia Phenes, M.D.   Assessment/Plan:   Pt is a 66 y.o. yo male with a PMHx of CAD s/p CABG  who was admitted on 02/18/2012 for chest pain, abnormal stress test .   1. CAD: Elevated Trop. ECG changes present.  Abnormal Stress test .  Currently  on Heparin, Nitro, Aspirin , Vasotec, Lopressor, Simvastatin, Hydrochlorthiazide .  - Cath today  2. HTN; Well controlled. Currently on  Vasotec, Lopressor, Hydrochlorthiazide .   3. HLD: :DL of 72. Currently on Simvastatin   Length of Stay: 1 days  Patient history and plan of care reviewed with attending, Dr. Vonna Drafts, M.D. 02/19/2012, 7:24 AM  Attending Note:   The patient was seen and examined.  Agree with assessment and plan as noted above.  Pt is feeling better this am.  Plan is for cath today. Enzymes are  Mildly positive.  Cath with possible pci today.  Vesta Mixer, Montez Hageman., MD, [facility] 02/19/2012, 8:23 AM

## 2012-02-20 ENCOUNTER — Encounter (HOSPITAL_COMMUNITY): Payer: Self-pay | Admitting: Physician Assistant

## 2012-02-20 DIAGNOSIS — I10 Essential (primary) hypertension: Secondary | ICD-10-CM

## 2012-02-20 DIAGNOSIS — I2510 Atherosclerotic heart disease of native coronary artery without angina pectoris: Secondary | ICD-10-CM

## 2012-02-20 LAB — BASIC METABOLIC PANEL
BUN: 17 mg/dL (ref 6–23)
Calcium: 8.7 mg/dL (ref 8.4–10.5)
Chloride: 99 mEq/L (ref 96–112)
Creatinine, Ser: 1.07 mg/dL (ref 0.50–1.35)
GFR calc Af Amer: 81 mL/min — ABNORMAL LOW (ref >90)
GFR calc non Af Amer: 70 mL/min — ABNORMAL LOW (ref >90)

## 2012-02-20 LAB — CBC
HCT: 33.4 % — ABNORMAL LOW (ref 39.0–52.0)
MCH: 31.9 pg (ref 26.0–34.0)
MCHC: 33.5 g/dL (ref 30.0–36.0)
MCV: 95.2 fL (ref 78.0–100.0)
RDW: 14 % (ref 11.5–15.5)

## 2012-02-20 MED ORDER — METOPROLOL TARTRATE 100 MG PO TABS: 50.0000 mg | ORAL_TABLET | Freq: Two times a day (BID) | ORAL | Status: DC

## 2012-02-20 MED ORDER — CLOPIDOGREL BISULFATE 75 MG PO TABS: 75.0000 mg | ORAL_TABLET | Freq: Every day | ORAL | Status: DC

## 2012-02-20 MED FILL — Dextrose Inj 5%: INTRAVENOUS | Qty: 50 | Status: AC

## 2012-02-20 NOTE — Progress Notes (Signed)
PROGRESS NOTE  Subjective:   Pt is doing well following PCI of his SVG to OM  Nurse noted that he had O2 sats of 88% last night.  Feels fine  Objective:    Vital Signs:   Temp:  [97.7 F (36.5 C)-99.7 F (37.6 C)] 98.2 F (36.8 C) (05/15 0505) Pulse Rate:  [45-91] 75  (05/15 0600) Resp:  [12-18] 18  (05/15 0505) BP: (114-157)/(53-73) 123/73 mmHg (05/15 0600) SpO2:  [88 %-96 %] 91 % (05/15 0600) Weight:  [194 lb 3.6 oz (88.1 kg)] 194 lb 3.6 oz (88.1 kg) (05/15 0505)  Last BM Date: 02/18/12   24-hour weight change: Weight change: 7 lb 7.9 oz (3.4 kg)  Weight trends: Filed Weights   02/18/12 1149 02/18/12 1232 02/20/12 0505  Weight: 186 lb 11.7 oz (84.7 kg) 186 lb 11.7 oz (84.7 kg) 194 lb 3.6 oz (88.1 kg)    Intake/Output:  05/14 0701 - 05/15 0700 In: 1386 [P.O.:720; I.V.:666] Out: 2100 [Urine:2100]     Physical Exam: BP 123/73  Pulse 75  Temp(Src) 98.2 F (36.8 C) (Oral)  Resp 18  Ht 5\' 8"  (1.727 m)  Wt 194 lb 3.6 oz (88.1 kg)  BMI 29.53 kg/m2  SpO2 91%  General: Vital signs reviewed and noted. Well-developed, well-nourished, in no acute distress; alert, appropriate and cooperative throughout examination.  Head: Normocephalic, atraumatic.  Eyes: conjunctivae/corneas clear. PERRL, EOM's intact. Fundi benign.  Throat: normal  Neck: Supple. Normal carotids. No JVD  Lungs:  Clear bilaterally to auscultation without wheezes, rales, or rhonchi. Breathing is unlabored.  Heart: Regular rate,  With normal  S1 S2. No murmurs, gallops or rubs  Abdomen:  Soft, non-tender, non-distended with normoactive bowel sounds. No hepatomegaly. No rebound/guarding. No abdominal masses.  Extremities: Distal pedal pulses are 2+ .  No edema.  Groin is ok. No hematoma.  Neurologic: A&O X3, CN II - XII are grossly intact. Motor strength is 5/5 in the all 4 extremities.  Psych: Responds to questions appropriately with normal affect.    Labs: BMET:  Basename 02/20/12 0355 02/19/12  0202  NA 134* 135  K 4.2 4.0  CL 99 99  CO2 28 25  GLUCOSE 106* 92  BUN 17 18  CREATININE 1.07 1.00  CALCIUM 8.7 8.7  MG -- --  PHOS -- --    Liver function tests:  Basename 02/18/12 1205  AST 15  ALT 11  ALKPHOS 47  BILITOT 0.2*  PROT 6.8  ALBUMIN 3.5   No results found for this basename: LIPASE:2,AMYLASE:2 in the last 72 hours  CBC:  Basename 02/20/12 0355 02/19/12 0202 02/18/12 1205  WBC 11.0* 10.2 --  NEUTROABS -- -- 6.7  HGB 11.2* 11.3* --  HCT 33.4* 32.5* --  MCV 95.2 92.9 --  PLT 175 176 --    Cardiac Enzymes:  Basename 02/18/12 2335 02/18/12 1741 02/18/12 1205  CKTOTAL 86 96 93  CKMB 3.7 4.0 3.8  TROPONINI 0.52* 0.37* 0.33*    Coagulation Studies:  Basename 02/18/12 1205  LABPROT 14.0  INR 1.06    Other: No components found with this basename: POCBNP:3 No results found for this basename: DDIMER in the last 72 hours  Basename 02/18/12 1205  HGBA1C 5.9*    Basename 02/19/12 0202  CHOL 123  HDL 30*  LDLCALC 72  TRIG 161  CHOLHDL 4.1    Basename 02/18/12 1205  TSH 1.871  T4TOTAL --  T3FREE --  THYROIDAB --   No results found for  this basename: VITAMINB12,FOLATE,FERRITIN,TIBC,IRON,RETICCTPCT in the last 72 hours   Tele:  NSR  Medications:    Infusions:    . sodium chloride 1 mL/kg/hr (02/19/12 1140)  . DISCONTD: sodium chloride 20 mL/hr at 02/18/12 2000  . DISCONTD: sodium chloride 50 mL/hr at 02/19/12 0400  . DISCONTD: heparin 1,300 Units/hr (02/19/12 0236)  . DISCONTD: nitroGLYCERIN 10 mcg/min (02/18/12 2000)    Scheduled Medications:    . amLODipine  10 mg Oral Daily  . aspirin EC  325 mg Oral Daily  . bivalirudin      . citalopram  20 mg Oral Daily  . clopidogrel      . clopidogrel  75 mg Oral Q breakfast  . diazepam      . diazepam  10 mg Oral Pre-Cath  . enalapril  20 mg Oral Daily  . fentaNYL      . heparin      . hydrochlorothiazide  25 mg Oral Daily  . lidocaine      . metoprolol  100 mg Oral Daily    . midazolam      . nitroGLYCERIN      . pneumococcal 23 valent vaccine  0.5 mL Intramuscular Tomorrow-1000  . simvastatin  20 mg Oral q1800  . sodium chloride  3 mL Intravenous Q12H  . DISCONTD: amLODipine  10 mg Oral Daily  . DISCONTD: aspirin EC  81 mg Oral Daily  . DISCONTD: citalopram  20 mg Oral Daily  . DISCONTD: enalapril  20 mg Oral Daily  . DISCONTD: hydrochlorothiazide  25 mg Oral Daily  . DISCONTD: metoprolol tartrate  50 mg Oral BID  . DISCONTD: simvastatin  20 mg Oral Daily    Assessment/ Plan:    1. CAD : s/p pci of SVG to OM.  Doing great. Home today.  Will likely need a different statin but we will change that in the office.  We will do nighttime oxymetry as OP>  Disposition: home today. Length of Stay: 2  Alvia Grove., MD, [facility] 02/20/2012, 7:35 AM Office [phone] Pager [phone]

## 2012-02-20 NOTE — Discharge Summary (Signed)
Discharge Summary   Patient ID: [name] MRN: [id number], DOB/AGE: 14-Oct-1945 66 y.o. Admit date: 02/18/2012 D/C date:     02/20/2012   Primary Discharge Diagnoses:  1.CAD - with NSTEMI this admission s/p PTCA/DES to SVG-OM 02/19/12 - h/o anterior MI 1989  - s/p MI/CABG 1998 (LIMA to LAD, SVG to OM, SVG to AM and SVG to PD complicated by mediastinitis). 2. Newly recognized LV dysfunction with EF 35% by cath - consider repeat echo 3 months to eval LV function 3. Mild anemia 4. Noctural desaturation - per Dr. Elease Hashimoto, will need nocturnal oximetry as OP  Secondary Discharge Diagnoses:  1. PVD s/p prior extensive endarterectomy of the right external iliac, common femoral bypass, prior R CEA  2. HTN 3. HL 4. Depression 5. Duodenal ulcer age 43  Hospital Course: 66 y/o M with hx of known CAD as noted above, HTN, HLD, tobacco abuse, remote duodenal ulcer. He was seen in the office 5/13 for a pre-cath visit due to markedly abnormal stress myoview. 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 It always occured at rest; he would take a BC and drink water with relief and therefore he had thought it was due to his stomach and not his heart. He was referred for planned cath but due to CP in the office & worsened ST-T changes inferolaterally, he was admitted to the hospital and started on heparin. Troponins returned mildly elevated up to 0.52. He underwent cardiac cath 02/19/12 demonstrating: Final Conclusions:  1. Severe three-vessel obstructive coronary disease.  2. Patent LIMA graft to LAD.  3. Patent saphenous vein graft to the acute marginal branch.  4. Occluded saphenous vein graft to the PDA  5. Severe ostial stenosis in the saphenous vein graft to the obtuse marginal vessel.  6. Moderate to severe left ventricular dysfunction. He subsequently underwent successful intracoronary stenting of the saphenous vein graft to the obtuse marginal  vessel with a drug-eluting stent. He tolerated the procedure well. The nurse did note he desatted to 88% overnight and Dr. Elease Hashimoto notes he will plan to do nighttime oximetry as an outpatient. His O2 sat was 91% on RA this AM but with ambulation with nurse tech it remained 95% on room air. Dr. Elease Hashimoto also notes the patient may need a change in his statin but will change that in the office. The patient was seen and examined today and felt stable for discharge by Dr. Elease Hashimoto. Of note, he is mildly anemic in line with labs 2009 but will be instructed to f/u PCP for eval.  Of note, I do not see any prior mention of LV dysfunction. He will likely require f/u echo in 3 months to determine if LV dysfunction improves with revascularization, and if it does not he may need consideration for ICD placement. I discussed his meds with Dr. [place] - for all-day coverage with his BB, we will split his Lopressor up into 50 BID (currently taking 100mg  daily) and keep him on ASA 81mg  daily.  Discharge Vitals: Blood pressure 126/55, pulse 69, temperature 98.1 F (36.7 C), temperature source Oral, resp. rate 20, height 5\' 8"  (1.727 m), weight 194 lb 3.6 oz (88.1 kg), SpO2 91.00%.  Labs: Lab Results  Component Value Date   WBC 11.0* 02/20/2012   HGB 11.2* 02/20/2012   HCT 33.4* 02/20/2012   MCV 95.2 02/20/2012   PLT 175 02/20/2012     Lab 02/20/12 0355 02/18/12 1205  NA 134* --  K 4.2 --  CL 99 --  CO2 28 --  BUN 17 --  CREATININE 1.07 --  CALCIUM 8.7 --  PROT -- 6.8  BILITOT -- 0.2*  ALKPHOS -- 47  ALT -- 11  AST -- 15  GLUCOSE 106* --    Basename 02/18/12 2335 02/18/12 1741 02/18/12 1205  CKTOTAL 86 96 93  CKMB 3.7 4.0 3.8  TROPONINI 0.52* 0.37* 0.33*   Lab Results  Component Value Date   CHOL 123 02/19/2012   HDL 30* 02/19/2012   LDLCALC 72 02/19/2012   TRIG 105 02/19/2012    Diagnostic Studies/Procedures   1. Chest X-ray 1 View 02/18/2012  *RADIOLOGY REPORT*  Clinical Data: Chest pain.  Preop  evaluation  PORTABLE CHEST - 1 VIEW  Comparison: 07/07/2008  Findings: Cardiac enlargement.  Changes of CABG.  Negative for heart failure.  Lungs are clear without infiltrate or mass lesion. Probable COPD.  IMPRESSION: Cardiac enlargement without acute cardiopulmonary disease.  Original Report Authenticated By: Camelia Phenes, M.D.    Discharge Medications   Medication List  As of 02/20/2012 10:19 AM   TAKE these medications         amLODipine 10 MG tablet   Commonly known as: NORVASC   Take 10 mg by mouth daily.      aspirin 81 MG tablet   Take 81 mg by mouth daily.      citalopram 20 MG tablet   Commonly known as: CELEXA   Take 20 mg by mouth daily.      clopidogrel 75 MG tablet   Commonly known as: PLAVIX   Take 1 tablet (75 mg total) by mouth daily with breakfast.      enalapril 20 MG tablet   Commonly known as: VASOTEC   Take 20 mg by mouth daily.      hydrochlorothiazide 25 MG tablet   Commonly known as: HYDRODIURIL   Take 25 mg by mouth daily.      metoprolol 100 MG tablet   Commonly known as: LOPRESSOR   Take 0.5 tablets (50 mg total) by mouth 2 (two) times daily.      nitroGLYCERIN 0.4 MG SL tablet   Commonly known as: NITROSTAT   Place 0.4 mg under the tongue every 5 (five) minutes as needed. For chest pain      Potassium Gluconate 595 MG Caps   Take 1 capsule by mouth daily.      PRESCRIPTION MEDICATION   Apply 1 application topically 2 (two) times daily. Eczema foam      simvastatin 20 MG tablet   Commonly known as: ZOCOR   Take 20 mg by mouth daily.            Disposition   The patient will be discharged in stable condition to home. Discharge Orders    Future Appointments: Provider: Department: Dept Phone: Center:   02/27/2012 10:00 AM Rosalio Macadamia, NP [facility] [phone] None     Future Orders Please Complete By Expires   Diet - low sodium heart healthy      Increase activity slowly      Comments:   No driving for 1 week. No  lifting over 10 lbs for 2 weeks. No sexual activity for 2 weeks. Increase activity slowly. Keep procedure site clean & dry. If you notice increased pain, swelling, bleeding or pus, call/return!  You may shower, but no soaking baths/hot tubs/pools for 1 week.   Discharge instructions  Comments:   Your cardiac catheterization showed weakness of the heart muscle this admission. This may make you more susceptible to weight gain from fluid retention, which can lead to symptoms that we call heart failure.  For patients with this condition, we give them these special instructions:  1. Weigh yourself on the same scale at same time of day. 2. Call your doctor: (Anytime you feel any of the following symptoms) - 3-4 pound weight gain in 1-2 days or 2 pounds overnight - Shortness of breath, with or without a dry hacking cough - Swelling in the hands, feet or stomach - If you have to sleep on extra pillows at night in order to breathe  You will likely need a repeat heart ultrasound in 3 months to determine if your stent helped your heart function.     Follow-up Information    Follow up with Primary Care Provider. (You were mildly anemic (low blood count) here in the hospital but your labs show this problem in the past. Please follow up with primary care doctor to determine if further workup is needed.)       Follow up with Norma Fredrickson, NP. (02/27/12 at 10am at [facility])    Contact information:   [address] [place] [address] [phone]            Duration of Discharge Encounter: Greater than 30 minutes including physician and PA time.  Signed, Ronie Spies PA-C 02/20/2012, 10:19 AM  Attending Note:   The patient was seen and examined.  Agree with assessment and plan as noted above.  See my note from earlier today  Alvia Grove., MD, [facility] 02/20/2012, 12:46 PM

## 2012-02-20 NOTE — Progress Notes (Signed)
CARDIAC REHAB PHASE I   PRE:  Rate/Rhythm: 81 SR  BP:  Supine:   Sitting: 126/55  Standing:    SaO2:   MODE:  Ambulation: 580 ft   POST:  Rate/Rhythem: 107 ST  BP:  Supine:   Sitting: 166/47  Standing:    SaO2:  0981-1914 Assisted X 1 to ambulate. Gait steady, pt c/o of left leg pain with walking He had to stop and do standing rest break due to pain. Pt was able to go further after rest stop. Pt to chair after walk. Completed discharge education with pt. He declines Outpt CRP due to distance to get  to a program is 30 miles.  Beatrix Fetters

## 2012-02-27 ENCOUNTER — Encounter: Payer: Self-pay | Admitting: Nurse Practitioner

## 2012-02-27 ENCOUNTER — Ambulatory Visit (INDEPENDENT_AMBULATORY_CARE_PROVIDER_SITE_OTHER): Payer: Medicare Other | Admitting: Nurse Practitioner

## 2012-02-27 VITALS — BP 138/70 | HR 74 | Ht 68.5 in | Wt 186.0 lb

## 2012-02-27 DIAGNOSIS — I214 Non-ST elevation (NSTEMI) myocardial infarction: Secondary | ICD-10-CM

## 2012-02-27 DIAGNOSIS — R0902 Hypoxemia: Secondary | ICD-10-CM

## 2012-02-27 DIAGNOSIS — I2510 Atherosclerotic heart disease of native coronary artery without angina pectoris: Secondary | ICD-10-CM

## 2012-02-27 LAB — CBC WITH DIFFERENTIAL/PLATELET
Basophils Absolute: 0.1 10*3/uL (ref 0.0–0.1)
Basophils Relative: 0.7 % (ref 0.0–3.0)
Eosinophils Absolute: 0.7 10*3/uL (ref 0.0–0.7)
Eosinophils Relative: 7.5 % — ABNORMAL HIGH (ref 0.0–5.0)
HCT: 35.1 % — ABNORMAL LOW (ref 39.0–52.0)
Hemoglobin: 11.6 g/dL — ABNORMAL LOW (ref 13.0–17.0)
Lymphocytes Relative: 17.6 % (ref 12.0–46.0)
Lymphs Abs: 1.6 10*3/uL (ref 0.7–4.0)
MCHC: 32.9 g/dL (ref 30.0–36.0)
MCV: 97.5 fl (ref 78.0–100.0)
Monocytes Absolute: 0.8 10*3/uL (ref 0.1–1.0)
Monocytes Relative: 8.4 % (ref 3.0–12.0)
Neutro Abs: 6.2 10*3/uL (ref 1.4–7.7)
Neutrophils Relative %: 65.8 % (ref 43.0–77.0)
Platelets: 219 10*3/uL (ref 150.0–400.0)
RBC: 3.61 Mil/uL — ABNORMAL LOW (ref 4.22–5.81)
RDW: 13.8 % (ref 11.5–14.6)
WBC: 9.4 10*3/uL (ref 4.5–10.5)

## 2012-02-27 LAB — BASIC METABOLIC PANEL
BUN: 19 mg/dL (ref 6–23)
CO2: 27 mEq/L (ref 19–32)
Calcium: 9.5 mg/dL (ref 8.4–10.5)
Chloride: 101 mEq/L (ref 96–112)
Creatinine, Ser: 1 mg/dL (ref 0.4–1.5)
GFR: 76.37 mL/min (ref >60.00)
Glucose, Bld: 138 mg/dL — ABNORMAL HIGH (ref 70–99)
Potassium: 3.9 mEq/L (ref 3.5–5.1)
Sodium: 137 mEq/L (ref 135–145)

## 2012-02-27 MED ORDER — HYDROCHLOROTHIAZIDE 25 MG PO TABS: 25.0000 mg | ORAL_TABLET | Freq: Every day | ORAL | Status: DC

## 2012-02-27 MED ORDER — AMLODIPINE BESYLATE 10 MG PO TABS: 10.0000 mg | ORAL_TABLET | Freq: Every day | ORAL | Status: DC

## 2012-02-27 MED ORDER — CLOPIDOGREL BISULFATE 75 MG PO TABS: 75.0000 mg | ORAL_TABLET | Freq: Every day | ORAL | Status: DC

## 2012-02-27 MED ORDER — ENALAPRIL MALEATE 20 MG PO TABS: 20.0000 mg | ORAL_TABLET | Freq: Every day | ORAL | Status: DC

## 2012-02-27 MED ORDER — METOPROLOL TARTRATE 100 MG PO TABS: 50.0000 mg | ORAL_TABLET | Freq: Two times a day (BID) | ORAL | Status: DC

## 2012-02-27 NOTE — Patient Instructions (Signed)
I have given you prescriptions for 90 days supply  We are checking labs today  We will plan on checking an ultrasound in 3 months to look at your heart's pumping function  I will see you in a month  Let me know when you want to see Dr. Hart Rochester about your legs  We are going to check an overnight oximetry.  Call the [facility] office at [phone] if you have any questions, problems or concerns.

## 2012-02-27 NOTE — Progress Notes (Signed)
[name] Date of Birth: [birth year] Medical Record #[id number]  History of Present Illness: [name] is seen today for a post hospital visit. He is seen for Dr. Elease Hashimoto. He has recently been hospitalized with chest pain, subsequent small NSTEMI with DES to SVG to the OM on Feb 19, 2012. EF is down to 35%. His other issues include prior anterior MI in 1989 with CABG x 4 complicated by mediastinitis, PVD, HTN, HLD, depression and past tobacco use. He was noted to have anemia and nocturnal desaturation.  He comes in today. He is here alone. He is doing ok. Forgot to take his medicines this morning. No more chest pain. Not short of breath. He is worried about his EF being down. We will need a repeat echo in 3 months post PCI. He remains limited by his claudication, especially in the left leg. He says he is not ready to go see Dr. Hart Rochester yet. He is tolerating his medicines and overall is improved.   Current Outpatient Prescriptions on File Prior to Visit  Medication Sig Dispense Refill  . amLODipine (NORVASC) 10 MG tablet Take 10 mg by mouth daily.      Marland Kitchen aspirin 81 MG tablet Take 81 mg by mouth daily.        . citalopram (CELEXA) 20 MG tablet Take 20 mg by mouth daily.      . clopidogrel (PLAVIX) 75 MG tablet Take 1 tablet (75 mg total) by mouth daily with breakfast.  30 tablet  11  . enalapril (VASOTEC) 20 MG tablet Take 20 mg by mouth daily.      . hydrochlorothiazide (HYDRODIURIL) 25 MG tablet Take 25 mg by mouth daily.      . metoprolol (LOPRESSOR) 100 MG tablet Take 0.5 tablets (50 mg total) by mouth 2 (two) times daily.      . nitroGLYCERIN (NITROSTAT) 0.4 MG SL tablet Place 0.4 mg under the tongue every 5 (five) minutes as needed. For chest pain      . Potassium Gluconate 595 MG CAPS Take 1 capsule by mouth daily.      Marland Kitchen PRESCRIPTION MEDICATION Apply 1 application topically 2 (two) times daily. Eczema foam      . simvastatin (ZOCOR) 20 MG tablet Take 20 mg by mouth daily.        No  Known Allergies  Past Medical History  Diagnosis Date  . Hyperlipidemia   . Hypertension   . Depression   . Tobacco abuse   . Duodenal ulcer     AGE 66  . PVD (peripheral vascular disease)     prior extensive endarterectomy of the right external iliac, common femoral bypass, prior R CEA  . Ischemic leg 2009    with extensive endarterectomy  . Coronary artery disease     a) anterior MI in 1989. b) MI with CABG in 1998. c) Small NSTEMI s/p DES to SVG-OM 02/2012, newly recognized LV dysfunction at that time  . LV dysfunction     EF 35% by cath 02/2012  . Anemia     Instructed 02/2012 to f/u with PCP  . Nocturnal oxygen desaturation 02/2012    Past Surgical History  Procedure Date  . Cardiac catheterization 05/04/2003`    EF 50-55%; Grafts patent. Managed medically  . Coronary artery bypass graft 1998  . Carotid endarterectomy 06/2008  . Appendectomy   . Cardiovascular stress test 12/14/2008    EF 52%  . R external iliac, common femoral and profunda femoris  endarterectomy 2009  . R common femoral to popliteal bypass     History  Smoking status  . Former Smoker -- 1.0 packs/day for 50 years  . Types: Cigarettes  . Quit date: 01/20/2012  Smokeless tobacco  . Not on file    History  Alcohol Use No    Family History  Problem Relation Age of Onset  . Lung disease Mother   . Osteoporosis Mother   . Lung cancer Father   . ALS Brother     Review of Systems: The review of systems is per the HPI.  All other systems were reviewed and are negative.  Physical Exam: Ht 5' 8.5" (1.74 m)  Wt 186 lb (84.369 kg)  BMI 27.87 kg/m2 Patient is very pleasant and in no acute distress. Skin is warm and dry. Color is normal.  HEENT is unremarkable. Normocephalic/atraumatic. PERRL. Sclera are nonicteric. Neck is supple. No masses. No JVD. Lungs are clear. Cardiac exam shows a regular rate and rhythm. Abdomen is soft. Extremities are without edema. Gait and ROM are intact. No gross neurologic  deficits noted.   LABORATORY DATA: Repeat labs today are pending.  Lab Results  Component Value Date   WBC 11.0* 02/20/2012   HGB 11.2* 02/20/2012   HCT 33.4* 02/20/2012   PLT 175 02/20/2012   GLUCOSE 106* 02/20/2012   CHOL 123 02/19/2012   TRIG 105 02/19/2012   HDL 30* 02/19/2012   LDLCALC 72 02/19/2012   ALT 11 02/18/2012   AST 15 02/18/2012   NA 134* 02/20/2012   K 4.2 02/20/2012   CL 99 02/20/2012   CREATININE 1.07 02/20/2012   BUN 17 02/20/2012   CO2 28 02/20/2012   TSH 1.871 02/18/2012   INR 1.06 02/18/2012   HGBA1C 5.9* 02/18/2012     Assessment / Plan:

## 2012-02-27 NOTE — Assessment & Plan Note (Signed)
He is doing well post NSTEMI with DES to the SVG to the OM. His symptoms of chest pain are resolved completely. We will continue with his current medicines. Recheck labs today. Will plan on checking an echo in August to reassess his EF. He is on ACE and beta blocker. No CHF symptoms at present. Will also arrange for overnight oximetry. Patient is agreeable to this plan and will call if any problems develop in the interim.

## 2012-03-04 ENCOUNTER — Ambulatory Visit: Payer: Medicare Other | Admitting: Nurse Practitioner

## 2012-03-31 ENCOUNTER — Ambulatory Visit: Payer: Medicare Other | Admitting: Nurse Practitioner

## 2012-04-02 ENCOUNTER — Encounter: Payer: Self-pay | Admitting: Nurse Practitioner

## 2012-04-02 ENCOUNTER — Ambulatory Visit (INDEPENDENT_AMBULATORY_CARE_PROVIDER_SITE_OTHER): Payer: Medicare Other | Admitting: Nurse Practitioner

## 2012-04-02 VITALS — BP 140/64 | HR 62 | Ht 68.5 in | Wt 185.8 lb

## 2012-04-02 DIAGNOSIS — I2510 Atherosclerotic heart disease of native coronary artery without angina pectoris: Secondary | ICD-10-CM

## 2012-04-02 DIAGNOSIS — I10 Essential (primary) hypertension: Secondary | ICD-10-CM

## 2012-04-02 DIAGNOSIS — R0902 Hypoxemia: Secondary | ICD-10-CM

## 2012-04-02 DIAGNOSIS — D649 Anemia, unspecified: Secondary | ICD-10-CM

## 2012-04-02 DIAGNOSIS — G4734 Idiopathic sleep related nonobstructive alveolar hypoventilation: Secondary | ICD-10-CM

## 2012-04-02 LAB — CBC WITH DIFFERENTIAL/PLATELET
Basophils Absolute: 0.1 10*3/uL (ref 0.0–0.1)
Basophils Relative: 0.6 % (ref 0.0–3.0)
Eosinophils Absolute: 0.7 10*3/uL (ref 0.0–0.7)
Eosinophils Relative: 6.8 % — ABNORMAL HIGH (ref 0.0–5.0)
HCT: 35.5 % — ABNORMAL LOW (ref 39.0–52.0)
Hemoglobin: 12 g/dL — ABNORMAL LOW (ref 13.0–17.0)
Lymphocytes Relative: 22.1 % (ref 12.0–46.0)
Lymphs Abs: 2.3 10*3/uL (ref 0.7–4.0)
MCHC: 33.8 g/dL (ref 30.0–36.0)
MCV: 96.3 fl (ref 78.0–100.0)
Monocytes Absolute: 1 10*3/uL (ref 0.1–1.0)
Monocytes Relative: 10 % (ref 3.0–12.0)
Neutro Abs: 6.2 10*3/uL (ref 1.4–7.7)
Neutrophils Relative %: 60.5 % (ref 43.0–77.0)
Platelets: 191 10*3/uL (ref 150.0–400.0)
RBC: 3.68 Mil/uL — ABNORMAL LOW (ref 4.22–5.81)
RDW: 13.9 % (ref 11.5–14.6)
WBC: 10.2 10*3/uL (ref 4.5–10.5)

## 2012-04-02 MED ORDER — HYDROCHLOROTHIAZIDE 25 MG PO TABS: 25.0000 mg | ORAL_TABLET | Freq: Every day | ORAL | Status: DC

## 2012-04-02 MED ORDER — AMLODIPINE BESYLATE 10 MG PO TABS: 10.0000 mg | ORAL_TABLET | Freq: Every day | ORAL | Status: DC

## 2012-04-02 MED ORDER — METOPROLOL TARTRATE 100 MG PO TABS: 50.0000 mg | ORAL_TABLET | Freq: Two times a day (BID) | ORAL | Status: DC

## 2012-04-02 MED ORDER — CLOPIDOGREL BISULFATE 75 MG PO TABS: 75.0000 mg | ORAL_TABLET | Freq: Every day | ORAL | Status: DC

## 2012-04-02 MED ORDER — ENALAPRIL MALEATE 20 MG PO TABS: 20.0000 mg | ORAL_TABLET | Freq: Every day | ORAL | Status: DC

## 2012-04-02 MED ORDER — SIMVASTATIN 20 MG PO TABS: 20.0000 mg | ORAL_TABLET | Freq: Every day | ORAL | Status: DC

## 2012-04-02 NOTE — Assessment & Plan Note (Signed)
He is doing well. Has had one episode of chest discomfort that was promptly relieved with NTG x 1. Remains on Plavix. Has returned to smoking some and he is cautioned about this. I think overall he is doing ok. If he has more recurrence, he is to be in touch with [place]. Will recheck his CBC today. Will find out the status of his overnight oximetry. Dr. Elease Hashimoto will see him on return. He will need an echo on return as well. Patient is agreeable to this plan and will call if any problems develop in the interim.

## 2012-04-02 NOTE — Assessment & Plan Note (Signed)
Blood pressure is up a little here today. No Norvasc for the past 2 days. I have given him a new set of all of his prescriptions.

## 2012-04-02 NOTE — Progress Notes (Signed)
[name] Date of Birth: 08/29/1946 Medical Record #[id number]  History of Present Illness: [name] is seen today for a follow up visit. He is seen for Dr. Elease Hashimoto. He has known CAD with remote CABG and remote MI. Other problems include PVD, HTN, HLD, depression and ongoing tobacco abuse. He had a NSTEMI with DES to the SVG to the OM last month. EF is down at 35%.   He comes in today. He is doing well. He lost his prescriptions I printed for him at his last visit. He has run out of the Norvasc for the past 2 days. Unfortunately, he is back smoking some. Did have one episode of chest pain shortly after his last visit here and had to use NTG x 1 with very prompt relief. He has not had recurrence. He is going to need a repeat echo to reevaluate his EF. He remains limited by his claudication. Not bad enough to go see Dr. Hart Rochester yet. He has not gotten his overnight oximetry study yet. He had had nocturnal desaturation while in the hospital. Was also a little anemic with that admission as well.   Current Outpatient Prescriptions on File Prior to Visit  Medication Sig Dispense Refill  . aspirin 81 MG tablet Take 81 mg by mouth daily.        . nitroGLYCERIN (NITROSTAT) 0.4 MG SL tablet Place 0.4 mg under the tongue every 5 (five) minutes as needed. For chest pain      . Potassium Gluconate 595 MG CAPS Take 1 capsule by mouth daily.      Marland Kitchen PRESCRIPTION MEDICATION Apply 1 application topically 2 (two) times daily. Eczema foam      . DISCONTD: enalapril (VASOTEC) 20 MG tablet Take 1 tablet (20 mg total) by mouth daily.  90 tablet  3  . DISCONTD: hydrochlorothiazide (HYDRODIURIL) 25 MG tablet Take 1 tablet (25 mg total) by mouth daily.  90 tablet  3  . DISCONTD: metoprolol (LOPRESSOR) 100 MG tablet Take 0.5 tablets (50 mg total) by mouth 2 (two) times daily.  90 tablet  3  . DISCONTD: simvastatin (ZOCOR) 20 MG tablet Take 20 mg by mouth daily.      Marland Kitchen DISCONTD: amLODipine (NORVASC) 10 MG tablet Take 1  tablet (10 mg total) by mouth daily.  90 tablet  3    No Known Allergies  Past Medical History  Diagnosis Date  . Hyperlipidemia   . Hypertension   . Depression   . Tobacco abuse   . Duodenal ulcer     AGE 66  . PVD (peripheral vascular disease)     prior extensive endarterectomy of the right external iliac, common femoral bypass, prior R CEA  . Ischemic leg 2009    with extensive endarterectomy  . Coronary artery disease     a) anterior MI in 1989. b) MI with CABG in 1998. c) Small NSTEMI s/p DES to SVG-OM 02/2012, newly recognized LV dysfunction at that time  . LV dysfunction     EF 35% by cath 02/2012  . Anemia     Instructed 02/2012 to f/u with PCP  . Nocturnal oxygen desaturation 02/2012  . Claudication     Past Surgical History  Procedure Date  . Cardiac catheterization 05/04/2003`    EF 50-55%; Grafts patent. Managed medically  . Coronary artery bypass graft 1998  . Carotid endarterectomy 06/2008  . Appendectomy   . Cardiovascular stress test 12/14/2008    EF 52%  . R  external iliac, common femoral and profunda femoris endarterectomy 2009  . R common femoral to popliteal bypass     History  Smoking status  . Current Everyday Smoker -- 0.5 packs/day for 50 years  . Types: Cigarettes  Smokeless tobacco  . Not on file    History  Alcohol Use No    Family History  Problem Relation Age of Onset  . Lung disease Mother   . Osteoporosis Mother   . Lung cancer Father   . ALS Brother     Review of Systems: The review of systems is positive for claudication.  All other systems were reviewed and are negative.  Physical Exam: BP 140/64  Pulse 62  Ht 5' 8.5" (1.74 m)  Wt 185 lb 12.8 oz (84.278 kg)  BMI 27.84 kg/m2 Patient is very pleasant and in no acute distress. He smells of tobacco. Skin is warm and dry. Color is normal.  HEENT is unremarkable. Normocephalic/atraumatic. PERRL. Sclera are nonicteric. Neck is supple. No masses. No JVD. Lungs are clear. Cardiac  exam shows a regular rate and rhythm. Abdomen is soft. Extremities are without edema. Gait and ROM are intact. No gross neurologic deficits noted.  LABORATORY DATA: Repeat CBC is pending.    Lab Results  Component Value Date   WBC 9.4 02/27/2012   HGB 11.6* 02/27/2012   HCT 35.1* 02/27/2012   PLT 219.0 02/27/2012   GLUCOSE 138* 02/27/2012   CHOL 123 02/19/2012   TRIG 105 02/19/2012   HDL 30* 02/19/2012   LDLCALC 72 02/19/2012   ALT 11 02/18/2012   AST 15 02/18/2012   NA 137 02/27/2012   K 3.9 02/27/2012   CL 101 02/27/2012   CREATININE 1.0 02/27/2012   BUN 19 02/27/2012   CO2 27 02/27/2012   TSH 1.871 02/18/2012   INR 1.06 02/18/2012   HGBA1C 5.9* 02/18/2012     Assessment / Plan:

## 2012-04-02 NOTE — Patient Instructions (Addendum)
We will try to get your overnight oximetry scheduled.  We are rechecking your blood count today  Stay on your same medicines  We will see you in 3 months  Let me know if you have anymore chest pain.   Call the [facility] office at [phone] if you have any questions, problems or concerns.

## 2012-06-26 ENCOUNTER — Ambulatory Visit (INDEPENDENT_AMBULATORY_CARE_PROVIDER_SITE_OTHER): Payer: Medicare Other | Admitting: Cardiovascular Disease

## 2012-06-26 ENCOUNTER — Encounter: Payer: Self-pay | Admitting: Cardiovascular Disease

## 2012-06-26 VITALS — BP 124/74 | HR 61 | Ht 68.5 in | Wt 191.2 lb

## 2012-06-26 DIAGNOSIS — I2510 Atherosclerotic heart disease of native coronary artery without angina pectoris: Secondary | ICD-10-CM

## 2012-06-26 NOTE — Patient Instructions (Addendum)
Your physician wants you to follow-up in: 6 months  You will receive a reminder letter in the mail two months in advance. If you don't receive a letter, please call our office to schedule the follow-up appointment.  Your physician recommends that you return for a FASTING lipid profile: 6 months   

## 2012-06-26 NOTE — Assessment & Plan Note (Signed)
[name] seems to be well. He has occasional episodes of chest discomfort. His episodes are nothing like prior to his stent procedure.  He continues to smoke some. He smokes perhaps one cigarette a day. He now uses an electronic cigarette.  Has a history of low HDL. He is willing to participate in the ACCELERATE study.    I will see him  again in 6 months for a followup office visit and fasting labs.

## 2012-06-26 NOTE — Progress Notes (Signed)
[name] Date of Birth  Feb 15, 1946 [facility] Office  [address]    [address]   [address] [place], [place]  [address]    [place], [place]  [address] [phone]  Fax  [phone]  [phone]  Fax [phone]  Problem list: 1. Coronary artery disease-status post CABG in 1998-complicated by mediastinitis, s/p PCI of OM in May, 2013. 2. Hypertension 3. Peripheral vascular disease  History of Present Illness:  [name] is a 66 year old gentleman with a long history of coronary artery disease.  He's not exercising on a regular basis.  He still smokes between one half and one pack of cigarettes a day.  He's not had any episodes of chest pain or shortness of breath. He has claudication in both legs when he walks. He denies any chest pain or shortness of breath.  Sept. 19, 2013 -  He has done well.  He had PCI in May.  He has had occasional episodes of CP - relieved with 1 NTG.  He is not getting any regular exercise.  He is limited by his PAD / claudication of his left leg.  He sees Dr. Hart Rochester for his PVD.   Current Outpatient Prescriptions on File Prior to Visit  Medication Sig Dispense Refill  . amLODipine (NORVASC) 10 MG tablet Take 1 tablet (10 mg total) by mouth daily.  90 tablet  3  . aspirin 81 MG tablet Take 81 mg by mouth daily.        . clopidogrel (PLAVIX) 75 MG tablet Take 1 tablet (75 mg total) by mouth daily with breakfast.  90 tablet  3  . enalapril (VASOTEC) 20 MG tablet Take 1 tablet (20 mg total) by mouth daily.  90 tablet  3  . hydrochlorothiazide (HYDRODIURIL) 25 MG tablet Take 1 tablet (25 mg total) by mouth daily.  90 tablet  3  . metoprolol (LOPRESSOR) 100 MG tablet Take 0.5 tablets (50 mg total) by mouth 2 (two) times daily.  90 tablet  3  . nitroGLYCERIN (NITROSTAT) 0.4 MG SL tablet Place 0.4 mg under the tongue every 5 (five) minutes as needed. For chest pain      . Potassium Gluconate 595 MG CAPS Take 1 capsule by  mouth daily.      Marland Kitchen PRESCRIPTION MEDICATION Apply 1 application topically 2 (two) times daily. Eczema foam      . simvastatin (ZOCOR) 20 MG tablet Take 1 tablet (20 mg total) by mouth daily.  90 tablet  3    No Known Allergies  Past Medical History  Diagnosis Date  . Hyperlipidemia   . Hypertension   . Depression   . Tobacco abuse   . Duodenal ulcer     AGE 66  . PVD (peripheral vascular disease)     prior extensive endarterectomy of the right external iliac, common femoral bypass, prior R CEA  . Ischemic leg 2009    with extensive endarterectomy  . Coronary artery disease     a) anterior MI in 1989. b) MI with CABG in 1998. c) Small NSTEMI s/p DES to SVG-OM 02/2012, newly recognized LV dysfunction at that time  . LV dysfunction     EF 35% by cath 02/2012  . Anemia     Instructed 02/2012 to f/u with PCP  . Nocturnal oxygen desaturation 02/2012  . Claudication     Past Surgical History  Procedure Date  . Cardiac catheterization 05/04/2003`  EF 50-55%; Grafts patent. Managed medically  . Coronary artery bypass graft 1998  . Carotid endarterectomy 06/2008  . Appendectomy   . Cardiovascular stress test 12/14/2008    EF 52%  . R external iliac, common femoral and profunda femoris endarterectomy 2009  . R common femoral to popliteal bypass     History  Smoking status  . Current Every Day Smoker -- 0.5 packs/day for 50 years  . Types: Cigarettes  Smokeless tobacco  . Not on file    History  Alcohol Use No    Family History  Problem Relation Age of Onset  . Lung disease Mother   . Osteoporosis Mother   . Lung cancer Father   . ALS Brother     Reviw of Systems:  Reviewed in the HPI.  All other systems are negative.  Physical Exam: Blood pressure 124/74, pulse 61, height 5' 8.5" (1.74 m), weight 191 lb 3.2 oz (86.728 kg), SpO2 98.00%. General: Well developed, well nourished, in no acute distress.  Head: Normocephalic, atraumatic, sclera non-icteric, mucus  membranes are moist,   Neck: Supple. Carotids are 2 + without bruits. No JVD  Lungs: Clear bilaterally to auscultation.  Heart: regular rate.  normal  S1 S2. No murmurs, gallops or rubs.  Abdomen: Soft, non-tender, non-distended with normal bowel sounds. No hepatomegaly. No rebound/guarding. No masses.  Msk:  Strength and tone are normal  Extremities: No clubbing or cyanosis. No edema.  Distal pedal pulses are 2+ and equal bilaterally.  Neuro: Alert and oriented X 3. Moves all extremities spontaneously.  Psych:  Responds to questions appropriately with a normal affect.  ECG: 01/15/2012-  normal sinus rhythm. He has left ventricular hypertrophy with repolarization changes. There is an old inferior wall myocardial infarction.  The lateral ST/T wave changes are new from his previous EKG.  Assessment / Plan:

## 2012-07-01 ENCOUNTER — Encounter: Payer: Self-pay | Admitting: Cardiology

## 2012-07-04 ENCOUNTER — Ambulatory Visit: Payer: Medicare Other | Admitting: Nurse Practitioner

## 2012-07-16 ENCOUNTER — Encounter: Payer: Self-pay | Admitting: Vascular Surgery

## 2013-03-08 DIAGNOSIS — I213 ST elevation (STEMI) myocardial infarction of unspecified site: Secondary | ICD-10-CM

## 2013-03-08 HISTORY — PX: CARDIAC CATHETERIZATION: SHX172

## 2013-03-08 HISTORY — DX: ST elevation (STEMI) myocardial infarction of unspecified site: I21.3

## 2013-03-21 ENCOUNTER — Encounter (HOSPITAL_COMMUNITY): Payer: Self-pay

## 2013-03-21 ENCOUNTER — Encounter (HOSPITAL_COMMUNITY)
Admission: EM | Disposition: A | Discharge status: Home / Self Care | Payer: Self-pay | Source: Home / Self Care | Attending: Emergency Medicine

## 2013-03-21 ENCOUNTER — Inpatient Hospital Stay (HOSPITAL_COMMUNITY)
Admission: EM | Admit: 2013-03-21 | Discharge: 2013-03-26 | DRG: 280 | Disposition: A | Discharge status: Home / Self Care | Payer: Medicare Other | Attending: Emergency Medicine | Admitting: Emergency Medicine

## 2013-03-21 DIAGNOSIS — I509 Heart failure, unspecified: Secondary | ICD-10-CM | POA: Diagnosis present

## 2013-03-21 DIAGNOSIS — I2589 Other forms of chronic ischemic heart disease: Secondary | ICD-10-CM | POA: Diagnosis present

## 2013-03-21 DIAGNOSIS — D649 Anemia, unspecified: Secondary | ICD-10-CM | POA: Diagnosis not present

## 2013-03-21 DIAGNOSIS — I252 Old myocardial infarction: Secondary | ICD-10-CM

## 2013-03-21 DIAGNOSIS — J449 Chronic obstructive pulmonary disease, unspecified: Secondary | ICD-10-CM | POA: Diagnosis present

## 2013-03-21 DIAGNOSIS — I10 Essential (primary) hypertension: Secondary | ICD-10-CM | POA: Diagnosis present

## 2013-03-21 DIAGNOSIS — I219 Acute myocardial infarction, unspecified: Secondary | ICD-10-CM

## 2013-03-21 DIAGNOSIS — F3289 Other specified depressive episodes: Secondary | ICD-10-CM | POA: Diagnosis present

## 2013-03-21 DIAGNOSIS — F329 Major depressive disorder, single episode, unspecified: Secondary | ICD-10-CM | POA: Diagnosis present

## 2013-03-21 DIAGNOSIS — I4891 Unspecified atrial fibrillation: Secondary | ICD-10-CM | POA: Diagnosis not present

## 2013-03-21 DIAGNOSIS — I2510 Atherosclerotic heart disease of native coronary artery without angina pectoris: Secondary | ICD-10-CM | POA: Diagnosis present

## 2013-03-21 DIAGNOSIS — Z79899 Other long term (current) drug therapy: Secondary | ICD-10-CM

## 2013-03-21 DIAGNOSIS — J4489 Other specified chronic obstructive pulmonary disease: Secondary | ICD-10-CM | POA: Diagnosis present

## 2013-03-21 DIAGNOSIS — E785 Hyperlipidemia, unspecified: Secondary | ICD-10-CM | POA: Diagnosis present

## 2013-03-21 DIAGNOSIS — I739 Peripheral vascular disease, unspecified: Secondary | ICD-10-CM | POA: Diagnosis present

## 2013-03-21 DIAGNOSIS — I5023 Acute on chronic systolic (congestive) heart failure: Secondary | ICD-10-CM | POA: Diagnosis present

## 2013-03-21 DIAGNOSIS — Z7982 Long term (current) use of aspirin: Secondary | ICD-10-CM

## 2013-03-21 DIAGNOSIS — I25810 Atherosclerosis of coronary artery bypass graft(s) without angina pectoris: Secondary | ICD-10-CM | POA: Diagnosis present

## 2013-03-21 DIAGNOSIS — Z951 Presence of aortocoronary bypass graft: Secondary | ICD-10-CM

## 2013-03-21 DIAGNOSIS — I214 Non-ST elevation (NSTEMI) myocardial infarction: Principal | ICD-10-CM | POA: Diagnosis present

## 2013-03-21 DIAGNOSIS — R7309 Other abnormal glucose: Secondary | ICD-10-CM | POA: Diagnosis present

## 2013-03-21 DIAGNOSIS — Z87891 Personal history of nicotine dependence: Secondary | ICD-10-CM

## 2013-03-21 DIAGNOSIS — Z9861 Coronary angioplasty status: Secondary | ICD-10-CM

## 2013-03-21 HISTORY — PX: LEFT HEART CATH: SHX5478

## 2013-03-21 HISTORY — PX: LEFT HEART CATH: SHX5946

## 2013-03-21 HISTORY — DX: Chronic obstructive pulmonary disease, unspecified: J44.9

## 2013-03-21 HISTORY — DX: Unspecified atrial fibrillation: I48.91

## 2013-03-21 HISTORY — DX: Chronic systolic (congestive) heart failure: I50.22

## 2013-03-21 LAB — CBC WITH DIFFERENTIAL/PLATELET
Eosinophils Absolute: 0.4 10*3/uL (ref 0.0–0.7)
Eosinophils Relative: 4 % (ref 0–5)
HCT: 38.3 % — ABNORMAL LOW (ref 39.0–52.0)
Hemoglobin: 13.7 g/dL (ref 13.0–17.0)
Lymphs Abs: 1.6 10*3/uL (ref 0.7–4.0)
MCH: 31.9 pg (ref 26.0–34.0)
MCHC: 35.8 g/dL (ref 30.0–36.0)
MCV: 89.3 fL (ref 78.0–100.0)
Monocytes Absolute: 1 10*3/uL (ref 0.1–1.0)
Monocytes Relative: 9 % (ref 3–12)
RBC: 4.29 MIL/uL (ref 4.22–5.81)

## 2013-03-21 LAB — POCT I-STAT TROPONIN I: Troponin i, poc: 0.18 ng/mL (ref 0.00–0.08)

## 2013-03-21 LAB — COMPREHENSIVE METABOLIC PANEL
Alkaline Phosphatase: 61 U/L (ref 39–117)
BUN: 17 mg/dL (ref 6–23)
Calcium: 9.6 mg/dL (ref 8.4–10.5)
Creatinine, Ser: 1.03 mg/dL (ref 0.50–1.35)
GFR calc Af Amer: 84 mL/min — ABNORMAL LOW (ref >90)
Glucose, Bld: 147 mg/dL — ABNORMAL HIGH (ref 70–99)
Potassium: 2.9 mEq/L — ABNORMAL LOW (ref 3.5–5.1)
Total Protein: 6.8 g/dL (ref 6.0–8.3)

## 2013-03-21 LAB — MRSA PCR SCREENING: MRSA by PCR: NEGATIVE

## 2013-03-21 LAB — POCT ACTIVATED CLOTTING TIME: Activated Clotting Time: 176 seconds

## 2013-03-21 SURGERY — LEFT HEART CATH
Anesthesia: LOCAL | Laterality: Bilateral

## 2013-03-21 MED ORDER — CLOPIDOGREL BISULFATE 75 MG PO TABS
75.0000 mg | ORAL_TABLET | Freq: Every day | ORAL | Status: DC
  Administered 2013-03-22 – 2013-03-26 (×5): 75 mg via ORAL
  Filled 2013-03-21 (×6): qty 1

## 2013-03-21 MED ORDER — METOPROLOL TARTRATE 1 MG/ML IV SOLN
INTRAVENOUS | Status: AC
  Filled 2013-03-21: qty 5

## 2013-03-21 MED ORDER — HEPARIN SODIUM (PORCINE) 1000 UNIT/ML IJ SOLN
INTRAMUSCULAR | Status: AC
  Filled 2013-03-21: qty 1

## 2013-03-21 MED ORDER — NITROGLYCERIN 0.4 MG SL SUBL: 0.4000 mg | SUBLINGUAL_TABLET | SUBLINGUAL | Status: DC | PRN

## 2013-03-21 MED ORDER — ACETAMINOPHEN 325 MG PO TABS: 650.0000 mg | ORAL_TABLET | ORAL | Status: DC | PRN

## 2013-03-21 MED ORDER — MORPHINE SULFATE 10 MG/5ML PO SOLN
4.0000 mg | ORAL | Status: DC | PRN
  Administered 2013-03-23 – 2013-03-25 (×6): 4 mg via ORAL
  Filled 2013-03-21 (×6): qty 5

## 2013-03-21 MED ORDER — AMLODIPINE BESYLATE 10 MG PO TABS
10.0000 mg | ORAL_TABLET | Freq: Every day | ORAL | Status: DC
  Administered 2013-03-22 – 2013-03-23 (×2): 10 mg via ORAL
  Filled 2013-03-21 (×3): qty 1

## 2013-03-21 MED ORDER — HYDROCHLOROTHIAZIDE 25 MG PO TABS
25.0000 mg | ORAL_TABLET | Freq: Every day | ORAL | Status: DC
  Filled 2013-03-21: qty 1

## 2013-03-21 MED ORDER — MORPHINE SULFATE 4 MG/ML IJ SOLN
INTRAMUSCULAR | Status: AC
  Filled 2013-03-21: qty 1

## 2013-03-21 MED ORDER — ONDANSETRON HCL 4 MG/2ML IJ SOLN
4.0000 mg | Freq: Four times a day (QID) | INTRAMUSCULAR | Status: DC | PRN
  Administered 2013-03-22: 4 mg via INTRAVENOUS
  Filled 2013-03-21: qty 2

## 2013-03-21 MED ORDER — HEPARIN SODIUM (PORCINE) 5000 UNIT/ML IJ SOLN
4000.0000 [IU] | Freq: Once | INTRAMUSCULAR | Status: AC
  Administered 2013-03-21: 4000 [IU] via INTRAVENOUS
  Filled 2013-03-21: qty 1

## 2013-03-21 MED ORDER — ONDANSETRON HCL 4 MG/2ML IJ SOLN: 4.0000 mg | Freq: Four times a day (QID) | INTRAMUSCULAR | Status: DC | PRN

## 2013-03-21 MED ORDER — ENALAPRIL MALEATE 20 MG PO TABS
20.0000 mg | ORAL_TABLET | Freq: Every day | ORAL | Status: DC
  Administered 2013-03-22 – 2013-03-23 (×2): 20 mg via ORAL
  Filled 2013-03-21 (×3): qty 1

## 2013-03-21 MED ORDER — HEPARIN (PORCINE) IN NACL 100-0.45 UNIT/ML-% IJ SOLN
1200.0000 [IU]/h | INTRAMUSCULAR | Status: DC
  Administered 2013-03-21: 1200 [IU]/h via INTRAVENOUS
  Filled 2013-03-21: qty 250

## 2013-03-21 MED ORDER — METOPROLOL TARTRATE 1 MG/ML IV SOLN
5.0000 mg | Freq: Once | INTRAVENOUS | Status: AC
  Administered 2013-03-21: 5 mg via INTRAVENOUS
  Filled 2013-03-21: qty 5

## 2013-03-21 MED ORDER — NITROGLYCERIN IN D5W 200-5 MCG/ML-% IV SOLN: 3.0000 ug/min | INTRAVENOUS | Status: DC

## 2013-03-21 MED ORDER — MORPHINE SULFATE 4 MG/ML IJ SOLN: 6.0000 mg | Freq: Once | INTRAMUSCULAR | Status: DC

## 2013-03-21 MED ORDER — LIDOCAINE HCL (PF) 1 % IJ SOLN
INTRAMUSCULAR | Status: AC
  Filled 2013-03-21: qty 30

## 2013-03-21 MED ORDER — METOPROLOL TARTRATE 50 MG PO TABS
50.0000 mg | ORAL_TABLET | Freq: Two times a day (BID) | ORAL | Status: DC
  Administered 2013-03-22: 50 mg via ORAL
  Filled 2013-03-21 (×3): qty 1

## 2013-03-21 MED ORDER — NITROGLYCERIN IN D5W 200-5 MCG/ML-% IV SOLN
INTRAVENOUS | Status: AC
  Filled 2013-03-21: qty 250

## 2013-03-21 MED ORDER — HEPARIN (PORCINE) IN NACL 2-0.9 UNIT/ML-% IJ SOLN
INTRAMUSCULAR | Status: AC
  Filled 2013-03-21: qty 1000

## 2013-03-21 MED ORDER — MIDAZOLAM HCL 2 MG/2ML IJ SOLN
INTRAMUSCULAR | Status: AC
  Filled 2013-03-21: qty 2

## 2013-03-21 MED ORDER — SODIUM CHLORIDE 0.9 % IV SOLN: 1.0000 mL/kg/h | INTRAVENOUS | Status: AC

## 2013-03-21 MED ORDER — MORPHINE SULFATE 2 MG/ML IJ SOLN
INTRAMUSCULAR | Status: AC
  Administered 2013-03-21: 6 mg via INTRAVENOUS
  Filled 2013-03-21: qty 1

## 2013-03-21 MED ORDER — NITROGLYCERIN IN D5W 200-5 MCG/ML-% IV SOLN
2.0000 ug/min | Freq: Once | INTRAVENOUS | Status: AC
  Administered 2013-03-21: 10 ug/min via INTRAVENOUS

## 2013-03-21 MED ORDER — ASPIRIN 81 MG PO TABS: 81.0000 mg | ORAL_TABLET | Freq: Every day | ORAL | Status: DC

## 2013-03-21 MED ORDER — ATORVASTATIN CALCIUM 80 MG PO TABS
80.0000 mg | ORAL_TABLET | Freq: Every day | ORAL | Status: DC
  Administered 2013-03-22 – 2013-03-25 (×4): 80 mg via ORAL
  Filled 2013-03-21 (×5): qty 1

## 2013-03-21 MED ORDER — ASPIRIN EC 81 MG PO TBEC
81.0000 mg | DELAYED_RELEASE_TABLET | Freq: Every day | ORAL | Status: DC
  Administered 2013-03-22 – 2013-03-23 (×2): 81 mg via ORAL
  Filled 2013-03-21 (×4): qty 1

## 2013-03-21 MED ORDER — POTASSIUM CHLORIDE 10 MEQ/100ML IV SOLN
10.0000 meq | INTRAVENOUS | Status: AC
  Administered 2013-03-22 (×3): 10 meq via INTRAVENOUS
  Filled 2013-03-21 (×3): qty 100

## 2013-03-21 NOTE — H&P (Signed)
[name] is an 67 y.o. male.   Primary Cardiologist: Dr. Elease Hashimoto  Chief Complaint: Chest pain  HPI: 67 y/o male with a PMH of CAD s/p CABG 1998 with LIMA-->LAD, SVG-->OM, SVG-->PDA, SVG-->acute marginal.  Patient was last seen for NSTEMI and underwent cardiac cath 02/18/2012 that showed patent LIMA-->LAD and patent SVG--acute marginal. His SVG-->PDA was occluded and SVG-->OM had a significant stenosis that was stented with a DES, LVEF was 35%.  Since then patient has had occasional chest pain that is usually relieved by SL nitroglycerin.  He reports that he is compliant with his medication and that he recently stopped smoking about 6 months ago.  He developed 10/10 chest pain while driving today at about 4:09 pm.  Chest pain is described as a pressure, 10/10 in severity, was associated with shortness of breath and diaphoresis, but he denied nausea, vomiting, radiation or palpitation.  He also denies prior PND or orthopnea.  He took several SL nitroglycerin with relief and subsequently presented to ER for further evaluation.  In ER, his initial EKG showed sinus tachycardia (HR 115 bpm) with 2-3 mm ST elevation in lead aVR and V1 with reciprocal ST-depression.  He received more SL nitroglycerin and Morphine 6 mg iv without relief and was subsequently started on a nitro/Heparin drip prior to going emergently to the cath lab.  His initial troponin was 0.18.  Patient is currently hemodynamically stable.  Past Medical History  Diagnosis Date  . Hyperlipidemia   . Hypertension   . Depression   . Tobacco abuse   . Duodenal ulcer     AGE 44  . PVD (peripheral vascular disease)     prior extensive endarterectomy of the right external iliac, common femoral bypass, prior R CEA  . Ischemic leg 2009    with extensive endarterectomy  . Coronary artery disease     a) anterior MI in 1989. b) MI with CABG in 1998. c) Small NSTEMI s/p DES to SVG-OM 02/2012, newly recognized LV dysfunction at that time  . LV  dysfunction     EF 35% by cath 02/2012  . Anemia     Instructed 02/2012 to f/u with PCP  . Nocturnal oxygen desaturation 02/2012  . Claudication     Past Surgical History  Procedure Laterality Date  . Cardiac catheterization  05/04/2003`    EF 50-55%; Grafts patent. Managed medically  . Coronary artery bypass graft  1998  . Carotid endarterectomy  06/2008  . Appendectomy    . Cardiovascular stress test  12/14/2008    EF 52%  . R external iliac, common femoral and profunda femoris endarterectomy  2009  . R common femoral to popliteal bypass      Family History  Problem Relation Age of Onset  . Lung disease Mother   . Osteoporosis Mother   . Lung cancer Father   . ALS Brother    Social History:  reports that he has been smoking Cigarettes.  He has a 25 pack-year smoking history. He does not have any smokeless tobacco history on file. He reports that he does not drink alcohol or use illicit drugs. He quit smoking 65-months ago  Allergies: No Known Allergies   (Not in a hospital admission)  Medication Amlodipine 10 mg qd ASA 81 mg qd Plavix 75 mg qd Enalapril 20 mg qd Metoprolol 100 mg bid Zocor 20 mg qhs  Results for orders placed during the hospital encounter of 03/21/13 (from the past 48 hour(s))  POCT I-STAT  TROPONIN I     Status: Abnormal   Collection Time    03/21/13  6:44 PM      Result Value Range   Troponin i, poc 0.18 (*) 0.00 - 0.08 ng/mL   Comment NOTIFIED PHYSICIAN     Comment 3            Comment: Due to the release kinetics of cTnI,     a negative result within the first hours     of the onset of symptoms does not rule out     myocardial infarction with certainty.     If myocardial infarction is still suspected,     repeat the test at appropriate intervals.   No results found.  Review of Systems  Constitutional: Negative for fever, chills, weight loss, malaise/fatigue and diaphoresis.  HENT: Negative for hearing loss, ear pain, nosebleeds, congestion,  sore throat, neck pain, tinnitus and ear discharge.   Eyes: Negative for blurred vision, double vision, photophobia, pain, discharge and redness.  Respiratory: Positive for shortness of breath. Negative for cough, hemoptysis, sputum production, wheezing and stridor.   Cardiovascular: Positive for chest pain. Negative for palpitations, orthopnea, claudication, leg swelling and PND.  Gastrointestinal: Negative for heartburn, nausea, vomiting, abdominal pain, diarrhea, constipation, blood in stool and melena.  Genitourinary: Negative for dysuria, urgency, frequency and hematuria.  Musculoskeletal: Negative for myalgias, back pain and joint pain.  Skin: Negative for itching and rash.  Neurological: Negative for dizziness, tingling, tremors, sensory change, speech change, seizures, weakness and headaches.  Psychiatric/Behavioral: Negative for hallucinations.    Blood pressure 165/87, pulse 117, temperature 98.1 F (36.7 C), temperature source Oral, resp. rate 15, height 5\' 8"  (1.727 m), weight 92.987 kg (205 lb), SpO2 94.00%. Physical Exam  Constitutional: He is oriented to person, place, and time. He appears well-developed and well-nourished. He appears distressed.  HENT:  Head: Normocephalic.  Eyes: EOM are normal. Right eye exhibits no discharge. Left eye exhibits no discharge. No scleral icterus.  Neck: Normal range of motion. Neck supple. No JVD present. No tracheal deviation present. No thyromegaly present.  Cardiovascular: Regular rhythm and normal heart sounds.  Exam reveals no gallop and no friction rub.   No murmur heard. Tachycardic, mild distress  Respiratory: Effort normal. No stridor. No respiratory distress. He has no wheezes. He has no rales. He exhibits no tenderness.  GI: Soft. Bowel sounds are normal. He exhibits no distension. There is no tenderness. There is no rebound and no guarding.  Musculoskeletal: Normal range of motion. He exhibits no tenderness.  Trace edema   Neurological: He is alert and oriented to person, place, and time.  Skin: No rash noted. He is not diaphoretic. No erythema.  Psychiatric: He has a normal mood and affect.     Assessment/Plan  1. STEMI.  Patient has been started on Nitro drip and Heparin drip and has been taken emergently to cardiac catheterization lab.  He will be on dual anti-platelet therapy and Zocor will be changed to Lipitor 80 mg qhs.  We will obtain TTE to evaluate LV function in the morning.  Patient will be admitted to cardiac ICU post-intervention.  2.  Ischemic cardiomyopathy, NYHA class 3, LVEF 35% by cath from 02/2012. Patient is on Metoprolol and Enalapril which will be continued. We will obtain TTE in the morning to re-evaluate his LV function. If LVEF is less than 35%, we will evaluate patient for risk and benefits of ICD.  3.  PVD s/p lower ext PTCA  and left CEA: patient is on dual anti-platelet therapy.  4.  HTN: currently not well controlled in the setting of significant chest pain. We will continue all anti-hypertensive medication and make modification after coronary intervention if BP is still elevated.  Curran Lenderman E 03/21/2013, 7:19 PM

## 2013-03-21 NOTE — Progress Notes (Signed)
Chaplain Note:  Reported to Trauma Room A in response to page for Code STEMI.  Pt stated he was in pain but was not afraid of dying...worried that his wife would be alone.  Provided emotional support for pt and his wife.  Accompanied her to Cath Lab waiting area and stayed with her until other family members arrived.  Will continue to follow as needed.  Rutherford Nail Chaplain [phone]

## 2013-03-21 NOTE — ED Notes (Signed)
Dr. Fayrene Fearing and Dr. Fonnie Jarvis at the bedside.

## 2013-03-21 NOTE — ED Notes (Signed)
Pt leaving floor to go to cath lab with this RN, cardiologist, and cath lab staff.

## 2013-03-21 NOTE — Progress Notes (Signed)
ANTICOAGULATION CONSULT NOTE - Initial Consult   Pharmacy Consult for heparin Indication: atrial fibrillation  No Known Allergies  Patient Measurements: Height: 5\' 8"  (172.7 cm) Weight: 205 lb (92.987 kg) IBW/kg (Calculated) : 68.4 Heparin Dosing Weight: 87kg  Vital Signs: Temp: 98.1 F (36.7 C) (06/14 1823) Temp src: Oral (06/14 1823) BP: 165/87 mmHg (06/14 1830) Pulse Rate: 117 (06/14 1830)  Labs: No results found for this basename: HGB, HCT, PLT, APTT, LABPROT, INR, HEPARINUNFRC, CREATININE, CKTOTAL, CKMB, TROPONINI,  in the last 72 hours  Estimated Creatinine Clearance: 78.2 ml/min (by C-G formula based on Cr of 1).   Medical History: Past Medical History  Diagnosis Date  . Hyperlipidemia   . Hypertension   . Depression   . Tobacco abuse   . Duodenal ulcer     AGE 67  . PVD (peripheral vascular disease)     prior extensive endarterectomy of the right external iliac, common femoral bypass, prior R CEA  . Ischemic leg 2009    with extensive endarterectomy  . Coronary artery disease     a) anterior MI in 1989. b) MI with CABG in 1998. c) Small NSTEMI s/p DES to SVG-OM 02/2012, newly recognized LV dysfunction at that time  . LV dysfunction     EF 35% by cath 02/2012  . Anemia     Instructed 02/2012 to f/u with PCP  . Nocturnal oxygen desaturation 02/2012  . Claudication     Assessment: 67 yo male here with CP and CODE STEMI to start heparin. Heparin bolus of 4000 units given at ~ 6:30pm.  Goal of Therapy:  Heparin level 0.3-0.7 units/ml Monitor platelets by anticoagulation protocol: Yes   Plan:  -Continue heparin at 1200 units/hr (~14 units/kg/hr) -Heparin level in 6 hours and daily wth CBC daily  Harland German, Pharm D 03/21/2013 6:53 PM

## 2013-03-21 NOTE — ED Provider Notes (Signed)
I saw and evaluated the patient, reviewed the resident's note and I agree with the findings including ECG interpretation and plan.  Pt received 325mg  ASA PTA.  Activated Code STEMI uncertain if ECG changes STEMI vs rate-related Afib changes, d/w Katrinka Blazing and Cards fellow. [id number]  Hurman Horn, MD 03/22/13 1343

## 2013-03-21 NOTE — CV Procedure (Signed)
Diagnostic Cardiac Catheterization Report  ARA MANO  67 y.o.  male January 12, 1946  Procedure Date: 03/21/2013 Referring Physician: Delane Ginger, MD Primary Cardiologist:: Delane Ginger, MD   PROCEDURE:  Left heart catheterization with selective coronary angiography, bypass graft angiography.  INDICATIONS:   67 year old gentleman presenting with unstable angina and possible non-ST elevation MI. Prolonged chest pain eventually remitting after IV beta blocker therapy, IV heparin, and IV nitroglycerin were started in the emergency room. He has history of prior coronary bypass grafting, 12 months ago had stenting of the ostium of the saphenous vein graft to the obtuse marginal. He is undergoing cath lab to define anatomy and the source of the prolonged chest pain.  The risks, benefits, and details of the procedure were explained to the patient.  The patient verbalized understanding and wanted to proceed.  Informed written consent was obtained.  PROCEDURE TECHNIQUE:  After Xylocaine anesthesia a 6 French sheath was placed in the right femoral artery with difficulty due to very weak femoral pulse. No left femoral pulse noted. Multiple passes with the Smart needle and a thinwall needle were made before ultimately hitting the femoral. Prior aorto femoral scar on the right side from previous bypass surgery. There was difficulty getting up the right iliac ultimately requiring a glide wire that we were able to prolapse through into the descending aorta. A 6 French A2 multipurpose was then advanced to the second and aorta over guidewire. This catheter was then used for saphenous vein graft angiography. This catheter was removed and exchanged for a 5 French left Judkins which was able to traverse the right iliac much easier than the 6 French catheter. Left coronary angiography was performed without difficulty. We then exchanged this catheter for a 5 French internal mammary catheter which was used for right  coronary angiography and internal mammary angiography. Left ventriculography was performed in an attempt to decrease the number of catheter exchanges to the right iliac. The only difference anatomy compared to the prior study is occlusion of the first obtuse marginal branch. The second branch is still patent. The DES in the saphenous vein graft is widely patent. I chose not to perform PCI of the small obtuse marginal branch for fear of performing/injuring the previously placed ostial stent. The patient pain-free at the completion of the case to   CONTRAST:  Total of 100 cc.  COMPLICATIONS:  None.    HEMODYNAMICS:  Aortic pressure was 130/80 mmHg; LV pressure was not recorded; right femoral pressure below the obstruction 80/40 mmHg.  ANGIOGRAPHIC DATA:     The left main coronary artery is 56% distal obstruction. Heavily calcified and diffusely diseased. No significant change compared to one year ago to.  The left anterior descending artery is totally occluded.  The left circumflex artery is severely and diffusely diseased with 90% obstruction in the first marginal/ramus branch, 99% in the proximal circumflex, and total occlusion after the second marginal.  The right coronary artery is totally occluded.  BYPASS GRAFT ANGIOGRAPHY:  Saphenous vein graft to the right coronary: Totally occluded  Saphenous vein graft to the acute marginal of the right coronary: Patent,.diffusely diseased, small in caliber, and 80% proximal stenosis. This vessel is not a reasonable candidate for PCI  Saphenous vein graft to the obtuse marginal: The stent in the ostium of the saphenous vein graft is widely patent. irregularities are noted in the mid graft. The graft then anastomosis into the obtuse marginal which has lost the antegrade limb beyond the  anastomosis. The second marginal branch fills retrogradely from the saphenous vein graft.  IMA graft to LAD: Widely patent  LEFT VENTRICULOGRAM:  Not performed    IMPRESSIONS:  1. Non-ST elevation myocardial infarction related to occlusion of the antegrade limb of the obtuse marginal distal to the graft anastomosis. This vessel is small and was not felt to be a reasonable target to intervene upon because of the ostial saphenous vein graft stent and the small caliber of the vessel. Furthermore the patient's pain had completely resolved.  2. Total occlusion of the LAD, native right coronary, and mid circumflex. Greater than 50% native left main disease.  3. Patent DES stent in the ostium of the saphenous vein graft to the marginal, occlusion of the saphenous vein graft to the right coronary,.diffuse disease in the saphenous and graft to the acute arsenal of the right coronary.  4. Widely patent LIMA to the LAD with diffusely diseased LAD beyond the graft and insertion.  5. Severe disease in the right iliac which has been previously stented. Absent left femoral pulse.  6. Left ventriculography not performed   RECOMMENDATION:  IV heparin, IV nitroglycerin, resumption of medications which the patient has not taken today, close observation of the patient's right lower extremity given the obvious disease in the iliac that was traversed with catheters during this procedure. I had detailed discussion with the patient prior to procedure about the risk of limb loss/ischemia, and he consented to the procedure citing the most recent catheterization performed one year ago from the right femoral approach

## 2013-03-21 NOTE — ED Notes (Signed)
Phlebotomy at the bedside  

## 2013-03-21 NOTE — ED Provider Notes (Signed)
History     CSN: [id number]  Arrival date & time 03/21/13  0865   First MD Initiated Contact with Patient 03/21/13 1825      Chief Complaint  Patient presents with  . Chest Pain    (Consider location/radiation/quality/duration/timing/severity/associated sxs/prior treatment) Patient is a 67 y.o. male presenting with chest pain. The history is provided by the patient and the EMS personnel.  Chest Pain Pain location:  Substernal area Pain quality: pressure and tightness   Pain radiates to:  Does not radiate Pain radiates to the back: no   Pain severity:  Moderate Onset quality:  Sudden Duration:  2 hours Timing:  Constant Progression:  Waxing and waning Chronicity:  Recurrent Context: at rest   Relieved by:  Nitroglycerin (morphine) Worsened by:  Nothing tried Ineffective treatments:  None tried Associated symptoms: diaphoresis, nausea and shortness of breath   Associated symptoms: no abdominal pain, no cough, no dizziness, no fever, no lower extremity edema, not vomiting and no weakness   Risk factors: coronary artery disease (s/p CABG, most recent MI 2013 s/p PCI)     Past Medical History  Diagnosis Date  . Hyperlipidemia   . Hypertension   . Depression   . Tobacco abuse   . Duodenal ulcer     AGE 25  . PVD (peripheral vascular disease)     prior extensive endarterectomy of the right external iliac, common femoral bypass, prior R CEA  . Ischemic leg 2009    with extensive endarterectomy  . Coronary artery disease     a) anterior MI in 1989. b) MI with CABG in 1998. c) Small NSTEMI s/p DES to SVG-OM 02/2012, newly recognized LV dysfunction at that time  . LV dysfunction     EF 35% by cath 02/2012  . Anemia     Instructed 02/2012 to f/u with PCP  . Nocturnal oxygen desaturation 02/2012  . Claudication     Past Surgical History  Procedure Laterality Date  . Cardiac catheterization  05/04/2003`    EF 50-55%; Grafts patent. Managed medically  . Coronary artery  bypass graft  1998  . Carotid endarterectomy  06/2008  . Appendectomy    . Cardiovascular stress test  12/14/2008    EF 52%  . R external iliac, common femoral and profunda femoris endarterectomy  2009  . R common femoral to popliteal bypass      Family History  Problem Relation Age of Onset  . Lung disease Mother   . Osteoporosis Mother   . Lung cancer Father   . ALS Brother     History  Substance Use Topics  . Smoking status: Current Every Day Smoker -- 0.50 packs/day for 50 years    Types: Cigarettes  . Smokeless tobacco: Not on file  . Alcohol Use: No      Review of Systems  Constitutional: Positive for diaphoresis. Negative for fever and chills.  Respiratory: Positive for shortness of breath. Negative for cough and chest tightness.   Cardiovascular: Positive for chest pain. Negative for leg swelling.  Gastrointestinal: Positive for nausea. Negative for vomiting, abdominal pain, diarrhea and constipation.  Genitourinary: Negative for dysuria.  Neurological: Negative for dizziness and weakness.  All other systems reviewed and are negative.    Allergies  Review of patient's allergies indicates no known allergies.  Home Medications   Current Outpatient Rx  Name  Route  Sig  Dispense  Refill  . amLODipine (NORVASC) 10 MG tablet   Oral   Take  1 tablet (10 mg total) by mouth daily.   90 tablet   3   . aspirin 81 MG tablet   Oral   Take 81 mg by mouth daily.           . clopidogrel (PLAVIX) 75 MG tablet   Oral   Take 1 tablet (75 mg total) by mouth daily with breakfast.   90 tablet   3   . enalapril (VASOTEC) 20 MG tablet   Oral   Take 1 tablet (20 mg total) by mouth daily.   90 tablet   3   . hydrochlorothiazide (HYDRODIURIL) 25 MG tablet   Oral   Take 1 tablet (25 mg total) by mouth daily.   90 tablet   3   . metoprolol (LOPRESSOR) 100 MG tablet   Oral   Take 0.5 tablets (50 mg total) by mouth 2 (two) times daily.   90 tablet   3     Note:  new dosing change - take 1/2 tablet twice da ...   . EXPIRED: nitroGLYCERIN (NITROSTAT) 0.4 MG SL tablet   Sublingual   Place 0.4 mg under the tongue every 5 (five) minutes as needed. For chest pain         . Potassium Gluconate 595 MG CAPS   Oral   Take 1 capsule by mouth daily.         Marland Kitchen PRESCRIPTION MEDICATION   Topical   Apply 1 application topically 2 (two) times daily. Eczema foam         . simvastatin (ZOCOR) 20 MG tablet   Oral   Take 1 tablet (20 mg total) by mouth daily.   90 tablet   3     BP 165/87  Pulse 117  Temp(Src) 98.1 F (36.7 C) (Oral)  Resp 15  Ht 5\' 8"  (1.727 m)  Wt 205 lb (92.987 kg)  BMI 31.18 kg/m2  SpO2 94%  Physical Exam  Nursing note and vitals reviewed. Constitutional: He is oriented to person, place, and time. He appears well-developed and well-nourished. No distress.  HENT:  Head: Normocephalic and atraumatic.  Right Ear: External ear normal.  Left Ear: External ear normal.  Mouth/Throat: Oropharynx is clear and moist.  Eyes: Pupils are equal, round, and reactive to light.  Neck: Normal range of motion. Neck supple.  Cardiovascular: Normal heart sounds and intact distal pulses.  Exam reveals no gallop and no friction rub.   No murmur heard. Tachycardic, irregularly irregular  Pulmonary/Chest: Effort normal and breath sounds normal. No respiratory distress. He has no wheezes. He has no rales.  Abdominal: Soft. There is no tenderness. There is no rebound and no guarding.  Musculoskeletal: Normal range of motion. He exhibits no edema and no tenderness.  Lymphadenopathy:    He has no cervical adenopathy.  Neurological: He is alert and oriented to person, place, and time.  Skin: Skin is warm and dry. No rash noted. No erythema.  Psychiatric: He has a normal mood and affect. His behavior is normal.    ED Course  Procedures (including critical care time)  Labs Reviewed  CBC WITH DIFFERENTIAL - Abnormal; Notable for the  following:    HCT 38.3 (*)    All other components within normal limits  COMPREHENSIVE METABOLIC PANEL - Abnormal; Notable for the following:    Potassium 2.9 (*)    Glucose, Bld 147 (*)    Total Bilirubin 0.2 (*)    GFR calc non Af Amer 73 (*)  GFR calc Af Amer 84 (*)    All other components within normal limits  TROPONIN I - Abnormal; Notable for the following:    Troponin I 0.91 (*)    All other components within normal limits  PRO B NATRIURETIC PEPTIDE - Abnormal; Notable for the following:    Pro B Natriuretic peptide (BNP) 2099.0 (*)    All other components within normal limits  POCT I-STAT TROPONIN I - Abnormal; Notable for the following:    Troponin i, poc 0.18 (*)    All other components within normal limits  MRSA PCR SCREENING  MAGNESIUM  TROPONIN I  TROPONIN I  TSH  T4, FREE  HEMOGLOBIN A1C  CBC  BASIC METABOLIC PANEL  LIPID PANEL  HEPARIN LEVEL (UNFRACTIONATED)  POCT ACTIVATED CLOTTING TIME   No results found.  Date: 03/22/2013  Rate: 115  Rhythm: atrial fibrillation  QRS Axis: normal  Intervals: normal  ST/T Wave abnormalities: ST elevation in aVR. ST depression in anterior and lateral leads.   Conduction Disutrbances:none  Narrative Interpretation:   Old EKG Reviewed: changes noted    1. ST elevation myocardial infarction 2. Chest pain    MDM  6:30 PM 67 year old male with a history of CAD status post CABG and most recent MI in 2013 status post PCI presenting with roughly 2 hours of a sudden onset substernal chest tightness with nausea shortness of breath. Patient states the symptoms are similar to his MI in the past. EKG in route shows atrial fibrillation with RVR along with ST elevation in lead aVR and notable ST depression in anterior and lateral leads. Patient has no history of atrial fibrillation. EKG changes are new in comparison to priors. Concern for STEMI given ST elevation in lead aVR supposed to be called. Cardiology at bedside. Patient  received 325 of aspirin in the field along with 3 sublingual nitroglycerin and 4 mg of morphine. Will repeat morphine here. Pt evaluated by cardiology and pt taken to cath lab for further management.         Caren Hazy, MD 03/22/13 [phone]

## 2013-03-21 NOTE — ED Notes (Signed)
Per [facility], pt started having sudden onset mid pressure chest pain, non radiating. Initial 12 lead was Afib with PVC's and PAC's pain 10/10. Took 325 mg ASA and 3 NTG prior to EMS arrival. Diaphoresis and nausea with pain. After NTG pain decreased from 10-8/10 but came right back. EMS gave 4 mg Morphine. Rhythm converted to ST rate of 115. Bilateral 18g to AC's.

## 2013-03-22 ENCOUNTER — Inpatient Hospital Stay (HOSPITAL_COMMUNITY): Payer: Medicare Other

## 2013-03-22 DIAGNOSIS — I509 Heart failure, unspecified: Secondary | ICD-10-CM

## 2013-03-22 DIAGNOSIS — I5023 Acute on chronic systolic (congestive) heart failure: Secondary | ICD-10-CM

## 2013-03-22 DIAGNOSIS — I214 Non-ST elevation (NSTEMI) myocardial infarction: Secondary | ICD-10-CM

## 2013-03-22 LAB — LIPID PANEL
Cholesterol: 147 mg/dL (ref 0–200)
HDL: 24 mg/dL — ABNORMAL LOW
LDL Cholesterol: 87 mg/dL (ref 0–99)
Total CHOL/HDL Ratio: 6.1 ratio
Triglycerides: 178 mg/dL — ABNORMAL HIGH
VLDL: 36 mg/dL (ref 0–40)

## 2013-03-22 LAB — BASIC METABOLIC PANEL
BUN: 14 mg/dL (ref 6–23)
CO2: 26 mEq/L (ref 19–32)
Calcium: 8.6 mg/dL (ref 8.4–10.5)
Chloride: 94 mEq/L — ABNORMAL LOW (ref 96–112)
Creatinine, Ser: 0.98 mg/dL (ref 0.50–1.35)
Glucose, Bld: 144 mg/dL — ABNORMAL HIGH (ref 70–99)

## 2013-03-22 LAB — TROPONIN I
Troponin I: >20 ng/mL (ref <0.30)
Troponin I: >20 ng/mL

## 2013-03-22 LAB — TSH: TSH: 2.303 u[IU]/mL (ref 0.350–4.500)

## 2013-03-22 LAB — CBC
HCT: 37.9 % — ABNORMAL LOW (ref 39.0–52.0)
Hemoglobin: 13.3 g/dL (ref 13.0–17.0)
MCH: 31.6 pg (ref 26.0–34.0)
MCV: 90 fL (ref 78.0–100.0)
RBC: 4.21 MIL/uL — ABNORMAL LOW (ref 4.22–5.81)
WBC: 10.6 10*3/uL — ABNORMAL HIGH (ref 4.0–10.5)

## 2013-03-22 MED ORDER — CARVEDILOL 12.5 MG PO TABS
12.5000 mg | ORAL_TABLET | Freq: Two times a day (BID) | ORAL | Status: DC
  Administered 2013-03-22 – 2013-03-26 (×8): 12.5 mg via ORAL
  Filled 2013-03-22 (×11): qty 1

## 2013-03-22 MED ORDER — IPRATROPIUM BROMIDE 0.02 % IN SOLN
0.5000 mg | Freq: Four times a day (QID) | RESPIRATORY_TRACT | Status: DC
  Administered 2013-03-22 (×3): 0.5 mg via RESPIRATORY_TRACT
  Filled 2013-03-22 (×2): qty 2.5

## 2013-03-22 MED ORDER — HEPARIN (PORCINE) IN NACL 100-0.45 UNIT/ML-% IJ SOLN
1800.0000 [IU]/h | INTRAMUSCULAR | Status: DC
  Administered 2013-03-22: 1550 [IU]/h via INTRAVENOUS
  Administered 2013-03-23 (×3): 1700 [IU]/h via INTRAVENOUS
  Filled 2013-03-22 (×4): qty 250

## 2013-03-22 MED ORDER — POTASSIUM CHLORIDE CRYS ER 20 MEQ PO TBCR
40.0000 meq | EXTENDED_RELEASE_TABLET | Freq: Once | ORAL | Status: AC
  Administered 2013-03-22: 40 meq via ORAL
  Filled 2013-03-22: qty 2

## 2013-03-22 MED ORDER — HEPARIN (PORCINE) IN NACL 100-0.45 UNIT/ML-% IJ SOLN
1300.0000 [IU]/h | INTRAMUSCULAR | Status: DC
  Administered 2013-03-22: 1300 [IU]/h via INTRAVENOUS
  Filled 2013-03-22 (×3): qty 250

## 2013-03-22 MED ORDER — FUROSEMIDE 10 MG/ML IJ SOLN
40.0000 mg | Freq: Two times a day (BID) | INTRAMUSCULAR | Status: DC
  Administered 2013-03-22 (×2): 40 mg via INTRAVENOUS
  Filled 2013-03-22 (×4): qty 4

## 2013-03-22 MED ORDER — IPRATROPIUM BROMIDE 0.02 % IN SOLN
RESPIRATORY_TRACT | Status: AC
  Filled 2013-03-22: qty 2.5

## 2013-03-22 NOTE — Progress Notes (Signed)
Patient ID: [name], male   DOB: Feb 12, 1946, 67 y.o.   MRN: [id number]    SUBJECTIVE: No chest pain this morning but he is short of breath. He is using a nonrebreather mask.    Marland Kitchen amLODipine  10 mg Oral Daily  . aspirin EC  81 mg Oral Daily  . atorvastatin  80 mg Oral q1800  . carvedilol  12.5 mg Oral BID WC  . clopidogrel  75 mg Oral Q breakfast  . enalapril  20 mg Oral Daily  . furosemide  40 mg Intravenous BID  . ipratropium  0.5 mg Nebulization Q6H  .  morphine injection  6 mg Intravenous Once  . potassium chloride  40 mEq Oral Once  NTG gtt @ 15 Heparin gtt    Filed Vitals:   03/22/13 0400 03/22/13 0500 03/22/13 0740 03/22/13 0741  BP: 135/71 149/67  150/73  Pulse: 73 87  81  Temp: 97.9 F (36.6 C)  98.5 F (36.9 C)   TempSrc: Axillary  Axillary   Resp: 12 25  17   Height:      Weight:  199 lb 4.7 oz (90.4 kg)    SpO2: 98% 76%  97%    Intake/Output Summary (Last 24 hours) at 03/22/13 0930 Last data filed at 03/22/13 0800  Gross per 24 hour  Intake 1172.38 ml  Output   1450 ml  Net -277.62 ml    LABS: Basic Metabolic Panel:  Recent Labs  40/98/11 1833 03/21/13 2104 03/22/13 0144  NA 135  --  132*  K 2.9*  --  3.6  CL 96  --  94*  CO2 25  --  26  GLUCOSE 147*  --  144*  BUN 17  --  14  CREATININE 1.03  --  0.98  CALCIUM 9.6  --  8.6  MG  --  2.0  --    Liver Function Tests:  Recent Labs  03/21/13 1833  AST 25  ALT 18  ALKPHOS 61  BILITOT 0.2*  PROT 6.8  ALBUMIN 3.8   No results found for this basename: LIPASE, AMYLASE,  in the last 72 hours CBC:  Recent Labs  03/21/13 1833 03/22/13 0144  WBC 10.3 10.6*  NEUTROABS 7.4  --   HGB 13.7 13.3  HCT 38.3* 37.9*  MCV 89.3 90.0  PLT 185 190   Cardiac Enzymes:  Recent Labs  03/21/13 1916 03/22/13 0143 03/22/13 0711  TROPONINI 0.91* >20.00* >20.00*   BNP: No components found with this basename: POCBNP,  D-Dimer: No results found for this basename: DDIMER,  in the last 72  hours Hemoglobin A1C:  Recent Labs  03/21/13 2104  HGBA1C 7.2*   Fasting Lipid Panel:  Recent Labs  03/22/13 0144  CHOL 147  HDL 24*  LDLCALC 87  TRIG 914*  CHOLHDL 6.1   Thyroid Function Tests:  Recent Labs  03/21/13 2104  TSH 2.303   Anemia Panel: No results found for this basename: VITAMINB12, FOLATE, FERRITIN, TIBC, IRON, RETICCTPCT,  in the last 72 hours  RADIOLOGY: Dg Chest Port 1 View  03/22/2013   *RADIOLOGY REPORT*  Clinical Data: Myocardial infarct.  Coronary artery disease.  Chest pain.  PORTABLE CHEST - 1 VIEW  Comparison: 02/18/2012  Findings: Mild to moderate cardiomegaly again noted as well as prior CABG.  Diffuse interstitial infiltrates are seen, consistent with interstitial edema.  There is no evidence of pulmonary consolidation or significant pleural effusion.  Surgical clips again seen in the left hemithorax.  IMPRESSION: Cardiomegaly and diffuse interstitial edema, consistent with congestive heart failure.   Original Report Authenticated By: Myles Rosenthal, M.D.    PHYSICAL EXAM General: mildly tachypneic Neck: JVP 10 cm, no thyromegaly or thyroid nodule.  Lungs: Distant breath sounds, crackles at bases, end expiratory wheezes.  CV: Nondisplaced PMI.  Heart regular S1/S2, no S3/S4, 2/6 HSM apex.  No peripheral edema.  No carotid bruit.   Abdomen: Soft, nontender, no hepatosplenomegaly, no distention.  Neurologic: Alert and oriented x 3.  Psych: Normal affect. Extremities: No clubbing or cyanosis. Right groin cath site benign.   TELEMETRY: Reviewed telemetry pt in NSR  ASSESSMENT AND PLAN: 67 yo with history of CAD s/p CABG, smoking, PAD, and ischemic CMP presented last night with NSTEMI. 1. CAD: NSTEMI.  Cath yesterday with occlusion of OM branch after touchdown of SVG-OM.  This is the likely culprit.  It was deemed too small for intervention.  No further chest pain but on NTG gtt.  - Continue ASA, Plavix, statin - Heparin was restarted after cath.   No groin complications so would continue until tomorrow and then stop (probably no benefit beyond 48 hrs).   - Anti-anginal treatment with amlodipine, Coreg, nitrates.  Will leave on NTG gtt today given significant CHF symptoms, likely transition to Imdur tomorrow.  2. Acute/chronic systolic CHF: EF 16% in 5/13.  He is volume overloaded on exam with elevated JVP and significant dyspnea.   - Stop HCTZ, start Lasix 40 mg IV bid with first dose now.  - Continue enalapril and beta blocker (will use Coreg rather than metoprolol).  - Echocardiogram.  3. COPD: Based on lung exam, I suspect he has a component of COPD.  Needs to quit smoking.  Will add scheduled nebulized Atrovent.   Marca Ancona 03/22/2013 9:50 AM

## 2013-03-22 NOTE — Progress Notes (Signed)
ANTICOAGULATION CONSULT NOTE   Pharmacy Consult for heparin Indication: CAD s/p cath  No Known Allergies  Patient Measurements: Height: 5\' 8"  (172.7 cm) Weight: 199 lb 4.7 oz (90.4 kg) IBW/kg (Calculated) : 68.4 Heparin Dosing Weight: 87kg  Vital Signs: Temp: 99.3 F (37.4 C) (06/15 1600) Temp src: Axillary (06/15 1600) BP: 136/65 mmHg (06/15 1400) Pulse Rate: 85 (06/15 1600)  Labs:  Recent Labs  03/21/13 1833 03/21/13 1916 03/22/13 0143 03/22/13 0144 03/22/13 0711 03/22/13 1449  HGB 13.7  --   --  13.3  --   --   HCT 38.3*  --   --  37.9*  --   --   PLT 185  --   --  190  --   --   HEPARINUNFRC  --   --   --   --   --  <0.10*  CREATININE 1.03  --   --  0.98  --   --   TROPONINI  --  0.91* >20.00*  --  >20.00*  --     Estimated Creatinine Clearance: 78.8 ml/min (by C-G formula based on Cr of 0.98).  Assessment: 67 yo male with NSTEMI, s/p cath on heparin at 1300 units/hr and heparin level is undetectable.   Goal of Therapy:  Heparin level 0.3-0.7 units/ml Monitor platelets by anticoagulation protocol: Yes   Plan:  -Increase heparin to 1550 units/hr   -Check heparin level in 8 hours.   Harland German, Pharm D 03/22/2013 4:23 PM

## 2013-03-22 NOTE — Progress Notes (Signed)
Sheath Pulled at 2200.  Vs stable.   Pt educated.  P/P noted.  Held pressure for 30 minutes.  Right groin level 0 soft no hematoma noted.   Applied pressure dressing and re-educated pt.  Will continue to monitor. Emilie [place]

## 2013-03-22 NOTE — Progress Notes (Signed)
ANTICOAGULATION CONSULT NOTE   Pharmacy Consult for heparin Indication: CAD s/p cath  No Known Allergies  Patient Measurements: Height: 5\' 8"  (172.7 cm) Weight: 201 lb 4.5 oz (91.3 kg) IBW/kg (Calculated) : 68.4 Heparin Dosing Weight: 87kg  Vital Signs: Temp: 98.2 F (36.8 C) (06/14 2000) Temp src: Oral (06/14 2000) BP: 158/83 mmHg (06/14 2300) Pulse Rate: 86 (06/14 2300)  Labs:  Recent Labs  03/21/13 1833 03/21/13 1916  HGB 13.7  --   HCT 38.3*  --   PLT 185  --   CREATININE 1.03  --   TROPONINI  --  0.91*    Estimated Creatinine Clearance: 75.3 ml/min (by C-G formula based on Cr of 1.03).  Assessment: 67 yo male with NSTEMI, s/p cath, to resume heparin  Goal of Therapy:  Heparin level 0.3-0.7 units/ml Monitor platelets by anticoagulation protocol: Yes   Plan:  Restart heparin 1300 units/hr   Check heparin level in 8 hours.   Geannie Risen, PharmD, BCPS   03/22/2013 12:09 AM

## 2013-03-23 DIAGNOSIS — I369 Nonrheumatic tricuspid valve disorder, unspecified: Secondary | ICD-10-CM

## 2013-03-23 DIAGNOSIS — I4891 Unspecified atrial fibrillation: Secondary | ICD-10-CM

## 2013-03-23 LAB — BASIC METABOLIC PANEL
BUN: 23 mg/dL (ref 6–23)
BUN: 23 mg/dL (ref 6–23)
CO2: 28 mEq/L (ref 19–32)
Chloride: 96 mEq/L (ref 96–112)
Chloride: 94 mEq/L — ABNORMAL LOW (ref 96–112)
Creatinine, Ser: 1.26 mg/dL (ref 0.50–1.35)
GFR calc Af Amer: 72 mL/min — ABNORMAL LOW (ref >90)
GFR calc Af Amer: 66 mL/min — ABNORMAL LOW (ref >90)
Glucose, Bld: 159 mg/dL — ABNORMAL HIGH (ref 70–99)
Glucose, Bld: 165 mg/dL — ABNORMAL HIGH (ref 70–99)
Potassium: 4.1 mEq/L (ref 3.5–5.1)
Potassium: 4.1 mEq/L (ref 3.5–5.1)

## 2013-03-23 LAB — CBC
HCT: 36.2 % — ABNORMAL LOW (ref 39.0–52.0)
Hemoglobin: 12.2 g/dL — ABNORMAL LOW (ref 13.0–17.0)
MCH: 30.8 pg (ref 26.0–34.0)
MCHC: 33.7 g/dL (ref 30.0–36.0)

## 2013-03-23 MED ORDER — AMIODARONE HCL IN DEXTROSE 360-4.14 MG/200ML-% IV SOLN
30.0000 mg/h | INTRAVENOUS | Status: DC
  Administered 2013-03-23 – 2013-03-24 (×2): 30 mg/h via INTRAVENOUS
  Filled 2013-03-23 (×3): qty 200

## 2013-03-23 MED ORDER — FUROSEMIDE 10 MG/ML IJ SOLN: 60.0000 mg | Freq: Three times a day (TID) | INTRAMUSCULAR | Status: DC

## 2013-03-23 MED ORDER — AMIODARONE HCL IN DEXTROSE 360-4.14 MG/200ML-% IV SOLN
60.0000 mg/h | INTRAVENOUS | Status: DC
  Administered 2013-03-23 (×2): 60 mg/h via INTRAVENOUS
  Filled 2013-03-23 (×2): qty 200

## 2013-03-23 MED ORDER — AMIODARONE LOAD VIA INFUSION
150.0000 mg | Freq: Once | INTRAVENOUS | Status: AC
  Administered 2013-03-23: 150 mg via INTRAVENOUS
  Filled 2013-03-23: qty 83.34

## 2013-03-23 MED ORDER — IPRATROPIUM BROMIDE 0.02 % IN SOLN
0.5000 mg | Freq: Two times a day (BID) | RESPIRATORY_TRACT | Status: DC
  Administered 2013-03-23 – 2013-03-26 (×7): 0.5 mg via RESPIRATORY_TRACT
  Filled 2013-03-23 (×6): qty 2.5

## 2013-03-23 MED ORDER — ISOSORBIDE MONONITRATE ER 30 MG PO TB24
30.0000 mg | ORAL_TABLET | Freq: Every day | ORAL | Status: DC
  Administered 2013-03-23 – 2013-03-26 (×4): 30 mg via ORAL
  Filled 2013-03-23 (×4): qty 1

## 2013-03-23 MED ORDER — ALBUTEROL SULFATE (5 MG/ML) 0.5% IN NEBU
2.5000 mg | INHALATION_SOLUTION | Freq: Two times a day (BID) | RESPIRATORY_TRACT | Status: DC
  Administered 2013-03-23 – 2013-03-26 (×7): 2.5 mg via RESPIRATORY_TRACT
  Filled 2013-03-23 (×7): qty 0.5

## 2013-03-23 MED ORDER — FUROSEMIDE 10 MG/ML IJ SOLN
40.0000 mg | Freq: Two times a day (BID) | INTRAMUSCULAR | Status: DC
  Administered 2013-03-23 (×2): 40 mg via INTRAVENOUS
  Filled 2013-03-23 (×2): qty 4

## 2013-03-23 MED ORDER — ALBUTEROL SULFATE (5 MG/ML) 0.5% IN NEBU
2.5000 mg | INHALATION_SOLUTION | Freq: Four times a day (QID) | RESPIRATORY_TRACT | Status: DC | PRN
Start: 2013-03-23 — End: 2013-03-26

## 2013-03-23 NOTE — Progress Notes (Signed)
Patient ID: [name], male   DOB: Apr 27, 1946, 67 y.o.   MRN: [id number]    SUBJECTIVE: No chest pain this morning and is breathing better after getting Lasix and nebs yesterday.  I/Os not all recorded, reports vigorous urine output.  He went into atrial fibrillation this morning. Groin stable.   Marland Kitchen albuterol  2.5 mg Nebulization BID  . amiodarone  150 mg Intravenous Once  . amLODipine  10 mg Oral Daily  . aspirin EC  81 mg Oral Daily  . atorvastatin  80 mg Oral q1800  . carvedilol  12.5 mg Oral BID WC  . clopidogrel  75 mg Oral Q breakfast  . enalapril  20 mg Oral Daily  . furosemide  40 mg Intravenous BID  . ipratropium  0.5 mg Nebulization BID  .  morphine injection  6 mg Intravenous Once  NTG gtt @ 15 Heparin gtt    Filed Vitals:   03/22/13 2336 03/23/13 0000 03/23/13 0351 03/23/13 0813  BP: 134/54  99/48 140/88  Pulse: 80  74   Temp:   99.5 F (37.5 C) 99.1 F (37.3 C)  TempSrc:   Axillary Oral  Resp: 23  15 14   Height:      Weight:   200 lb 6.4 oz (90.9 kg)   SpO2: 89% 93% 94% 94%    Intake/Output Summary (Last 24 hours) at 03/23/13 0832 Last data filed at 03/23/13 0000  Gross per 24 hour  Intake  930.5 ml  Output   1300 ml  Net -369.5 ml    LABS: Basic Metabolic Panel:  Recent Labs  40/98/11 1833 03/21/13 2104 03/22/13 0144 03/23/13 0204  NA 135  --  132* 131*  K 2.9*  --  3.6 4.1  CL 96  --  94* 96  CO2 25  --  26 27  GLUCOSE 147*  --  144* 159*  BUN 17  --  14 23  CREATININE 1.03  --  0.98 1.17  CALCIUM 9.6  --  8.6 8.8  MG  --  2.0  --   --    Liver Function Tests:  Recent Labs  03/21/13 1833  AST 25  ALT 18  ALKPHOS 61  BILITOT 0.2*  PROT 6.8  ALBUMIN 3.8   No results found for this basename: LIPASE, AMYLASE,  in the last 72 hours CBC:  Recent Labs  03/21/13 1833 03/22/13 0144 03/23/13 0204  WBC 10.3 10.6* 12.7*  NEUTROABS 7.4  --   --   HGB 13.7 13.3 12.2*  HCT 38.3* 37.9* 36.2*  MCV 89.3 90.0 91.4  PLT 185 190 160     Cardiac Enzymes:  Recent Labs  03/21/13 1916 03/22/13 0143 03/22/13 0711  TROPONINI 0.91* >20.00* >20.00*   BNP: No components found with this basename: POCBNP,  D-Dimer: No results found for this basename: DDIMER,  in the last 72 hours Hemoglobin A1C:  Recent Labs  03/21/13 2104  HGBA1C 7.2*   Fasting Lipid Panel:  Recent Labs  03/22/13 0144  CHOL 147  HDL 24*  LDLCALC 87  TRIG 914*  CHOLHDL 6.1   Thyroid Function Tests:  Recent Labs  03/21/13 2104  TSH 2.303   Anemia Panel: No results found for this basename: VITAMINB12, FOLATE, FERRITIN, TIBC, IRON, RETICCTPCT,  in the last 72 hours  RADIOLOGY: Dg Chest Port 1 View  03/22/2013   *RADIOLOGY REPORT*  Clinical Data: Myocardial infarct.  Coronary artery disease.  Chest pain.  PORTABLE CHEST - 1  VIEW  Comparison: 02/18/2012  Findings: Mild to moderate cardiomegaly again noted as well as prior CABG.  Diffuse interstitial infiltrates are seen, consistent with interstitial edema.  There is no evidence of pulmonary consolidation or significant pleural effusion.  Surgical clips again seen in the left hemithorax.  IMPRESSION: Cardiomegaly and diffuse interstitial edema, consistent with congestive heart failure.   Original Report Authenticated By: Myles Rosenthal, M.D.    PHYSICAL EXAM General: mildly tachypneic Neck: JVP 8-9 cm, no thyromegaly or thyroid nodule.  Lungs: Distant breath sounds, no wheezes today.  CV: Nondisplaced PMI.  Heart regular S1/S2, no S3/S4, 2/6 HSM apex.  No peripheral edema.  No carotid bruit.   Abdomen: Soft, nontender, no hepatosplenomegaly, no distention.  Neurologic: Alert and oriented x 3.  Psych: Normal affect. Extremities: No clubbing or cyanosis. Right groin cath site benign.   TELEMETRY: Reviewed telemetry pt in NSR  ASSESSMENT AND PLAN: 67 yo with history of CAD s/p CABG, smoking, PAD, and ischemic CMP presented with NSTEMI. 1. CAD: NSTEMI.  Cath Saturday with occlusion of OM  branch after touchdown of SVG-OM.  This is the likely culprit.  It was deemed too small for intervention.  No further chest pain but on NTG gtt.  - Continue ASA, Plavix, statin - Titrate off NTG gtt today and onto Imdur.  - Heparin was restarted after cath.  No groin complications.  Continue for now since he has gone into atrial fibrillation. - Anti-anginal treatment with amlodipine, Coreg, nitrates.   2. Acute/chronic systolic CHF: EF 19% in 5/13. He was volume overloaded yesterday, doing better today after diuresis. - Continue IV Lasix today, try to record accurate I/Os. - Continue enalapril and beta blocker (will use Coreg rather than metoprolol).  - Echocardiogram today.  3. COPD: Based on lung exam, I suspect he has a component of COPD.  Needs to quit smoking.  Now on scheduled nebulized Atrovent.  4. Atrial fibrillation: New this morning.  Continue heparin gtt.  Start amiodarone gtt to try to convert back to NSR.  If does not convert, may need DCCV (can do tomorrow without TEE if needed).   Marca Ancona 03/23/2013 8:32 AM

## 2013-03-23 NOTE — Progress Notes (Signed)
Echocardiogram 2D Echocardiogram has been performed.  Arvil Chaco 03/23/2013, 3:55 PM

## 2013-03-23 NOTE — Progress Notes (Signed)
ANTICOAGULATION CONSULT NOTE Pharmacy Consult for heparin Indication: CAD s/p cath  No Known Allergies  Patient Measurements: Height: 5\' 8"  (172.7 cm) Weight: 199 lb 4.7 oz (90.4 kg) IBW/kg (Calculated) : 68.4 Heparin Dosing Weight: 87kg  Vital Signs: Temp: 100.6 F (38.1 C) (06/15 2334) Temp src: Axillary (06/15 2334) BP: 134/54 mmHg (06/15 2336) Pulse Rate: 80 (06/15 2336)  Labs:  Recent Labs  03/21/13 1833 03/21/13 1916 03/22/13 0143 03/22/13 0144 03/22/13 0711 03/22/13 1449 03/23/13 0204  HGB 13.7  --   --  13.3  --   --  12.2*  HCT 38.3*  --   --  37.9*  --   --  36.2*  PLT 185  --   --  190  --   --  160  HEPARINUNFRC  --   --   --   --   --  <0.10* 0.18*  CREATININE 1.03  --   --  0.98  --   --  1.17  TROPONINI  --  0.91* >20.00*  --  >20.00*  --   --     Estimated Creatinine Clearance: 66 ml/min (by C-G formula based on Cr of 1.17).  Assessment: 67 yo male with NSTEMI, s/p cath, to resume heparin  Goal of Therapy:  Heparin level 0.3-0.7 units/ml Monitor platelets by anticoagulation protocol: Yes   Plan:  Increase Heparin 1700 units/hr Check heparin level in 8 hours.  Geannie Risen, PharmD, BCPS   03/23/2013 2:54 AM

## 2013-03-23 NOTE — Progress Notes (Signed)
ANTICOAGULATION CONSULT NOTE Pharmacy Consult for heparin Indication: CAD s/p cath; Atrial fibrillation  No Known Allergies Patient Measurements: Height: 5\' 8"  (172.7 cm) Weight: 200 lb 6.4 oz (90.9 kg) IBW/kg (Calculated) : 68.4 Heparin Dosing Weight: 87kg Vital Signs: Temp: 99.7 F (37.6 C) (06/16 1155) Temp src: Oral (06/16 1155) BP: 138/50 mmHg (06/16 1155) Pulse Rate: 74 (06/16 0351) Labs:  Recent Labs  03/21/13 1833 03/21/13 1916 03/21/13 2104 03/22/13 0143 03/22/13 0144 03/22/13 0711 03/22/13 1449 03/23/13 0204 03/23/13 1129  HGB 13.7  --   --   --  13.3  --   --  12.2*  --   HCT 38.3*  --   --   --  37.9*  --   --  36.2*  --   PLT 185  --   --   --  190  --   --  160  --   LABPROT  --   --  13.0  --   --   --   --   --   --   INR  --   --  0.99  --   --   --   --   --   --   HEPARINUNFRC  --   --   --   --   --   --  <0.10* 0.18* 0.33  CREATININE 1.03  --   --   --  0.98  --   --  1.17 1.26  TROPONINI  --  0.91*  --  >20.00*  --  >20.00*  --   --   --    Estimated Creatinine Clearance: 61.4 ml/min (by C-G formula based on Cr of 1.26).  Assessment: 67 yo male with NSTEMI now s/p cath resumed on heparin. Heparin level this PM is therapeutic at 0.33. No bleeding reported. Groin site stable. Currently continuing heparin as patient now in Afib and possible plan for DCCV.   Goal of Therapy:  Heparin level 0.3-0.7 units/ml Monitor platelets by anticoagulation protocol: Yes   Plan:  Continue Heparin 1700 units/hr Check heparin level in 8 hours to confirm.  Daily heparin level and CBC while on therapy.   Link Snuffer, PharmD, BCPS Clinical Pharmacist [phone]  03/23/2013 12:58 PM

## 2013-03-24 LAB — CBC
HCT: 32.1 % — ABNORMAL LOW (ref 39.0–52.0)
RBC: 3.53 MIL/uL — ABNORMAL LOW (ref 4.22–5.81)
RDW: 13.7 % (ref 11.5–15.5)
WBC: 12.2 10*3/uL — ABNORMAL HIGH (ref 4.0–10.5)

## 2013-03-24 LAB — BASIC METABOLIC PANEL
Chloride: 91 mEq/L — ABNORMAL LOW (ref 96–112)
GFR calc Af Amer: 57 mL/min — ABNORMAL LOW (ref >90)
GFR calc non Af Amer: 49 mL/min — ABNORMAL LOW (ref >90)
Glucose, Bld: 135 mg/dL — ABNORMAL HIGH (ref 70–99)
Potassium: 3.9 mEq/L (ref 3.5–5.1)
Sodium: 128 mEq/L — ABNORMAL LOW (ref 135–145)

## 2013-03-24 LAB — HEPARIN LEVEL (UNFRACTIONATED): Heparin Unfractionated: 0.21 IU/mL — ABNORMAL LOW (ref 0.30–0.70)

## 2013-03-24 MED ORDER — APIXABAN 5 MG PO TABS
5.0000 mg | ORAL_TABLET | Freq: Two times a day (BID) | ORAL | Status: DC
  Administered 2013-03-24 – 2013-03-26 (×5): 5 mg via ORAL
  Filled 2013-03-24 (×6): qty 1

## 2013-03-24 MED ORDER — ENALAPRIL MALEATE 10 MG PO TABS
10.0000 mg | ORAL_TABLET | Freq: Two times a day (BID) | ORAL | Status: DC
  Administered 2013-03-24 – 2013-03-26 (×4): 10 mg via ORAL
  Filled 2013-03-24 (×5): qty 1

## 2013-03-24 MED ORDER — AMIODARONE HCL 200 MG PO TABS
400.0000 mg | ORAL_TABLET | Freq: Two times a day (BID) | ORAL | Status: DC
  Administered 2013-03-24 – 2013-03-26 (×5): 400 mg via ORAL
  Filled 2013-03-24 (×6): qty 2

## 2013-03-24 MED ORDER — SPIRONOLACTONE 12.5 MG HALF TABLET
12.5000 mg | ORAL_TABLET | Freq: Every day | ORAL | Status: DC
  Administered 2013-03-24 – 2013-03-26 (×3): 12.5 mg via ORAL
  Filled 2013-03-24 (×3): qty 1

## 2013-03-24 MED ORDER — AMLODIPINE BESYLATE 5 MG PO TABS
5.0000 mg | ORAL_TABLET | Freq: Every day | ORAL | Status: DC
  Administered 2013-03-24 – 2013-03-26 (×3): 5 mg via ORAL
  Filled 2013-03-24 (×3): qty 1

## 2013-03-24 MED ORDER — FUROSEMIDE 10 MG/ML IJ SOLN
40.0000 mg | Freq: Three times a day (TID) | INTRAMUSCULAR | Status: DC
  Administered 2013-03-24 (×2): 40 mg via INTRAVENOUS
  Filled 2013-03-24 (×4): qty 4

## 2013-03-24 NOTE — Progress Notes (Addendum)
Patient ID: [name], male   DOB: Aug 05, 1946, 67 y.o.   MRN: [id number]    SUBJECTIVE: No chest pain this morning but still short of breath.  He is back in NSR with PACs on amiodarone.    Marland Kitchen albuterol  2.5 mg Nebulization BID  . amiodarone  400 mg Oral BID  . amLODipine  5 mg Oral Daily  . apixaban  5 mg Oral BID  . atorvastatin  80 mg Oral q1800  . carvedilol  12.5 mg Oral BID WC  . clopidogrel  75 mg Oral Q breakfast  . enalapril  10 mg Oral BID  . furosemide  40 mg Intravenous BID  . ipratropium  0.5 mg Nebulization BID  . isosorbide mononitrate  30 mg Oral Daily  .  morphine injection  6 mg Intravenous Once  . spironolactone  12.5 mg Oral Daily     Filed Vitals:   03/23/13 2351 03/24/13 0200 03/24/13 0423 03/24/13 0500  BP: 106/48  115/55   Pulse: 80 55 69   Temp: 98.8 F (37.1 C)  98.9 F (37.2 C)   TempSrc: Oral  Oral   Resp: 16 21 19    Height:      Weight:    201 lb 4.5 oz (91.3 kg)  SpO2: 90% 93% 93%     Intake/Output Summary (Last 24 hours) at 03/24/13 0729 Last data filed at 03/24/13 0600  Gross per 24 hour  Intake 2192.05 ml  Output   3300 ml  Net -1107.95 ml    LABS: Basic Metabolic Panel:  Recent Labs  40/98/11 1833 03/21/13 2104  03/23/13 0204 03/23/13 1129  NA 135  --   < > 131* 133*  K 2.9*  --   < > 4.1 4.1  CL 96  --   < > 96 94*  CO2 25  --   < > 27 28  GLUCOSE 147*  --   < > 159* 165*  BUN 17  --   < > 23 23  CREATININE 1.03  --   < > 1.17 1.26  CALCIUM 9.6  --   < > 8.8 8.6  MG  --  2.0  --   --   --   < > = values in this interval not displayed. Liver Function Tests:  Recent Labs  03/21/13 1833  AST 25  ALT 18  ALKPHOS 61  BILITOT 0.2*  PROT 6.8  ALBUMIN 3.8   No results found for this basename: LIPASE, AMYLASE,  in the last 72 hours CBC:  Recent Labs  03/21/13 1833  03/23/13 0204 03/24/13 0412  WBC 10.3  < > 12.7* 12.2*  NEUTROABS 7.4  --   --   --   HGB 13.7  < > 12.2* 10.9*  HCT 38.3*  < > 36.2* 32.1*    MCV 89.3  < > 91.4 90.9  PLT 185  < > 160 144*  < > = values in this interval not displayed. Cardiac Enzymes:  Recent Labs  03/21/13 1916 03/22/13 0143 03/22/13 0711  TROPONINI 0.91* >20.00* >20.00*   BNP: No components found with this basename: POCBNP,  D-Dimer: No results found for this basename: DDIMER,  in the last 72 hours Hemoglobin A1C:  Recent Labs  03/21/13 2104  HGBA1C 7.2*   Fasting Lipid Panel:  Recent Labs  03/22/13 0144  CHOL 147  HDL 24*  LDLCALC 87  TRIG 914*  CHOLHDL 6.1   Thyroid Function Tests:  Recent  Labs  03/21/13 2104  TSH 2.303   Anemia Panel: No results found for this basename: VITAMINB12, FOLATE, FERRITIN, TIBC, IRON, RETICCTPCT,  in the last 72 hours  RADIOLOGY: Dg Chest Port 1 View  03/22/2013   *RADIOLOGY REPORT*  Clinical Data: Myocardial infarct.  Coronary artery disease.  Chest pain.  PORTABLE CHEST - 1 VIEW  Comparison: 02/18/2012  Findings: Mild to moderate cardiomegaly again noted as well as prior CABG.  Diffuse interstitial infiltrates are seen, consistent with interstitial edema.  There is no evidence of pulmonary consolidation or significant pleural effusion.  Surgical clips again seen in the left hemithorax.  IMPRESSION: Cardiomegaly and diffuse interstitial edema, consistent with congestive heart failure.   Original Report Authenticated By: Myles Rosenthal, M.D.    PHYSICAL EXAM General: mildly tachypneic Neck: JVP 8-9 cm, no thyromegaly or thyroid nodule.  Lungs: Distant breath sounds, end expiratory wheezes CV: Nondisplaced PMI.  Heart regular S1/S2, no S3/S4, 2/6 HSM apex.  Trace ankle edema.  No carotid bruit.   Abdomen: Soft, nontender, no hepatosplenomegaly, no distention.  Neurologic: Alert and oriented x 3.  Psych: Normal affect. Extremities: No clubbing or cyanosis. Right groin cath site benign.   TELEMETRY: Reviewed telemetry pt in NSR  ASSESSMENT AND PLAN: 67 yo with history of CAD s/p CABG, smoking, PAD,  and ischemic CMP presented with NSTEMI. 1. CAD: NSTEMI.  Cath Saturday with occlusion of OM branch after touchdown of SVG-OM.  This is the likely culprit.  It was deemed too small for intervention.  No further chest pain.  - Continue Plavix, statin.  Will stop aspirin as I am going to put him on apixaban.  - Stop heparin gtt - Anti-anginal treatment with amlodipine, Coreg, Imdur.  Cut back on amlodipine to 5 mg daily to allow room for other meds.  2. Acute/chronic systolic CHF: EF 16-10% by echo. He remains volume overloaded and short of breath. - Continue IV Lasix today at q 8hrs, try to record accurate I/Os. - Continue enalapril and beta blocker.  - Add spironolactone 12.5 mg daily.   - Awaiting labs today.  3. COPD: Based on lung exam, I suspect he has a component of COPD.  Needs to quit smoking.  Now on scheduled nebulized Atrovent.  4. Atrial fibrillation: Back in NSR on amiodarone gtt. - D/c amiodarone gtt, start po amiodarone.  - D/c IV heparin gtt, start apixaban.  5. Ambulate today.  Keep on step-down, probably to floor tomorrow if stable.   Marca Ancona 03/24/2013 7:29 AM

## 2013-03-24 NOTE — Progress Notes (Signed)
CARDIAC REHAB PHASE I   PRE:  Rate/Rhythm: 69SR  BP:  Supine:   Sitting: 108/48  Standing:    SaO2: 95%4L  MODE:  Ambulation: 270 ft   POST:  Rate/Rhythm: 84SR  BP:  Supine:   Sitting: 124/63  Standing:    SaO2: stayed above 90% for half of walk and then dropped to 85% RA. Took 4L to keep sats up to 93%. [id number] Pt walked 270 ft holding to siderail on RA for part of walk. Then he desaturated to 85 % and it took 4 L to keep up. Pt denied dizziness this walk. C/o legs weak. Tired by end of walk. Left on 4L. Has cough which he said is nonproductive. Had dizziness with earlier walk but was not on oxygen. Pt had to stop frequently to rest due to SOB with this walk, even with the 4L.   Luetta Nutting, RN BSN  03/24/2013 2:39 PM

## 2013-03-24 NOTE — Progress Notes (Signed)
Ambulated along the hall about 500 ft, complained of slight dizziness. V/s bp 176/51, hr- 98, pulse ox - 99% 0n room air.

## 2013-03-24 NOTE — Progress Notes (Signed)
ANTICOAGULATION CONSULT NOTE Pharmacy Consult for heparin Indication: CAD s/p cath; Atrial fibrillation  No Known Allergies Patient Measurements: Height: 5\' 8"  (172.7 cm) Weight: 201 lb 4.5 oz (91.3 kg) IBW/kg (Calculated) : 68.4 Heparin Dosing Weight: 87kg Vital Signs: Temp: 98.9 F (37.2 C) (06/17 0423) Temp src: Oral (06/17 0423) BP: 115/55 mmHg (06/17 0423) Pulse Rate: 69 (06/17 0423) Labs:  Recent Labs  03/21/13 1916 03/21/13 2104 03/22/13 0143 03/22/13 0144 03/22/13 0711  03/23/13 0204 03/23/13 1129 03/24/13 0412  HGB  --   --   --  13.3  --   --  12.2*  --  10.9*  HCT  --   --   --  37.9*  --   --  36.2*  --  32.1*  PLT  --   --   --  190  --   --  160  --  144*  LABPROT  --  13.0  --   --   --   --   --   --   --   INR  --  0.99  --   --   --   --   --   --   --   HEPARINUNFRC  --   --   --   --   --   < > 0.18* 0.33 0.21*  CREATININE  --   --   --  0.98  --   --  1.17 1.26  --   TROPONINI 0.91*  --  >20.00*  --  >20.00*  --   --   --   --   < > = values in this interval not displayed. Estimated Creatinine Clearance: 61.6 ml/min (by C-G formula based on Cr of 1.26).  Assessment: 67 yo male s/p cath with Afib, for heparin.  Goal of Therapy:  Heparin level 0.3-0.7 units/ml Monitor platelets by anticoagulation protocol: Yes   Plan:  Increase Heparin 1800 units/hr Check heparin level in 8 hours.  Geannie Risen, PharmD, BCPS  03/24/2013 5:21 AM

## 2013-03-24 NOTE — Progress Notes (Signed)
UR Completed.  [name] [phone] 03/24/2013  

## 2013-03-24 NOTE — Progress Notes (Signed)
[place].to 87 % on room air , o2 4L [place]  applied. o2 sat- 92 %.

## 2013-03-25 ENCOUNTER — Inpatient Hospital Stay (HOSPITAL_COMMUNITY): Payer: Medicare Other

## 2013-03-25 LAB — BASIC METABOLIC PANEL
CO2: 28 mEq/L (ref 19–32)
Glucose, Bld: 142 mg/dL — ABNORMAL HIGH (ref 70–99)
Potassium: 3.6 mEq/L (ref 3.5–5.1)
Sodium: 130 mEq/L — ABNORMAL LOW (ref 135–145)

## 2013-03-25 LAB — CBC
HCT: 31.4 % — ABNORMAL LOW (ref 39.0–52.0)
Hemoglobin: 10.9 g/dL — ABNORMAL LOW (ref 13.0–17.0)
MCH: 31.1 pg (ref 26.0–34.0)
RBC: 3.5 MIL/uL — ABNORMAL LOW (ref 4.22–5.81)

## 2013-03-25 MED ORDER — POTASSIUM CHLORIDE CRYS ER 20 MEQ PO TBCR
40.0000 meq | EXTENDED_RELEASE_TABLET | Freq: Once | ORAL | Status: AC
  Administered 2013-03-25: 40 meq via ORAL
  Filled 2013-03-25: qty 2

## 2013-03-25 MED ORDER — FUROSEMIDE 10 MG/ML IJ SOLN
40.0000 mg | Freq: Two times a day (BID) | INTRAMUSCULAR | Status: DC
  Administered 2013-03-25 – 2013-03-26 (×3): 40 mg via INTRAVENOUS
  Filled 2013-03-25 (×2): qty 4

## 2013-03-25 NOTE — Progress Notes (Addendum)
CARDIAC REHAB PHASE I   PRE:  Rate/Rhythm: 61 SR  BP:  Supine: 96/43  Sitting:   Standing:    SaO2: 98 4L 92RA  MODE:  Ambulation: 350 ft   POST:  Rate/Rhythm: 78 SR  BP:  Supine:   Sitting: 135/49  Standing:    SaO2: 89-91 RA 1200-1250 On arrival pt on O2 4L, O2 discontinued room air sat 92%. Pt walked on room air sat 89-91%. No c/o of cp walking, states that he feels a little SOB, but better today than yesterday.O2 left off after walk and reported to RN.Pt does c/o of left leg pain with walking, which he had prior to admission. Started MI and CHF education with pt. He voices understanding. He declines Outpt. CRP, not interested. Pt states that his leg pain inhibited his ability  to walk. Noted that his AIC is elevated. We discussed CHF signs and symptoms, daily weights, when to call MD and 911. He voices understanding.  Melina Copa RN 03/25/2013 12:43 PM

## 2013-03-25 NOTE — Progress Notes (Signed)
Patient ID: [name], male   DOB: 11-12-1945, 67 y.o.   MRN: [id number]    SUBJECTIVE: No chest pain this morning and breathing better but still requiring supplemental oxygen.  He is back in NSR with PACs on amiodarone.    Marland Kitchen albuterol  2.5 mg Nebulization BID  . amiodarone  400 mg Oral BID  . amLODipine  5 mg Oral Daily  . apixaban  5 mg Oral BID  . atorvastatin  80 mg Oral q1800  . carvedilol  12.5 mg Oral BID WC  . clopidogrel  75 mg Oral Q breakfast  . enalapril  10 mg Oral BID  . furosemide  40 mg Intravenous BID  . ipratropium  0.5 mg Nebulization BID  . isosorbide mononitrate  30 mg Oral Daily  .  morphine injection  6 mg Intravenous Once  . potassium chloride  40 mEq Oral Once  . spironolactone  12.5 mg Oral Daily     Filed Vitals:   03/24/13 2017 03/25/13 0000 03/25/13 0355 03/25/13 0500  BP:  120/47 121/62   Pulse:   73   Temp:  98.3 F (36.8 C) 98.7 F (37.1 C)   TempSrc:  Oral Oral   Resp:   17   Height:      Weight:    200 lb 2.8 oz (90.8 kg)  SpO2: 95%  92%     Intake/Output Summary (Last 24 hours) at 03/25/13 0728 Last data filed at 03/25/13 0600  Gross per 24 hour  Intake    690 ml  Output   3200 ml  Net  -2510 ml    LABS: Basic Metabolic Panel:  Recent Labs  44/01/02 0735 03/25/13 0358  NA 128* 130*  K 3.9 3.6  CL 91* 93*  CO2 25 28  GLUCOSE 135* 142*  BUN 30* 33*  CREATININE 1.42* 1.33  CALCIUM 8.1* 8.5   Liver Function Tests: No results found for this basename: AST, ALT, ALKPHOS, BILITOT, PROT, ALBUMIN,  in the last 72 hours No results found for this basename: LIPASE, AMYLASE,  in the last 72 hours CBC:  Recent Labs  03/24/13 0412 03/25/13 0358  WBC 12.2* 10.4  HGB 10.9* 10.9*  HCT 32.1* 31.4*  MCV 90.9 89.7  PLT 144* 132*   Cardiac Enzymes: No results found for this basename: CKTOTAL, CKMB, CKMBINDEX, TROPONINI,  in the last 72 hours BNP: No components found with this basename: POCBNP,  D-Dimer: No results found  for this basename: DDIMER,  in the last 72 hours Hemoglobin A1C: No results found for this basename: HGBA1C,  in the last 72 hours Fasting Lipid Panel: No results found for this basename: CHOL, HDL, LDLCALC, TRIG, CHOLHDL, LDLDIRECT,  in the last 72 hours Thyroid Function Tests: No results found for this basename: TSH, T4TOTAL, FREET3, T3FREE, THYROIDAB,  in the last 72 hours Anemia Panel: No results found for this basename: VITAMINB12, FOLATE, FERRITIN, TIBC, IRON, RETICCTPCT,  in the last 72 hours  RADIOLOGY: Dg Chest Port 1 View  03/22/2013   *RADIOLOGY REPORT*  Clinical Data: Myocardial infarct.  Coronary artery disease.  Chest pain.  PORTABLE CHEST - 1 VIEW  Comparison: 02/18/2012  Findings: Mild to moderate cardiomegaly again noted as well as prior CABG.  Diffuse interstitial infiltrates are seen, consistent with interstitial edema.  There is no evidence of pulmonary consolidation or significant pleural effusion.  Surgical clips again seen in the left hemithorax.  IMPRESSION: Cardiomegaly and diffuse interstitial edema, consistent with congestive heart  failure.   Original Report Authenticated By: Myles Rosenthal, M.D.    PHYSICAL EXAM General: mildly tachypneic Neck: JVP 8-9 cm, no thyromegaly or thyroid nodule.  Lungs: Distant breath sounds, end expiratory wheezes CV: Nondisplaced PMI.  Heart regular S1/S2, no S3/S4, 2/6 HSM apex.  1+ ankle edema.  No carotid bruit.   Abdomen: Soft, nontender, no hepatosplenomegaly, no distention.  Neurologic: Alert and oriented x 3.  Psych: Normal affect. Extremities: No clubbing or cyanosis. Right groin cath site benign.   TELEMETRY: Reviewed telemetry pt in NSR  ASSESSMENT AND PLAN: 67 yo with history of CAD s/p CABG, smoking, PAD, and ischemic CMP presented with NSTEMI. 1. CAD: NSTEMI.  Cath Saturday with occlusion of OM branch after touchdown of SVG-OM.  This is the likely culprit.  It was deemed too small for intervention.  No further chest pain.   - Continue Plavix, statin.  Stopped aspirin since he is on apixaban.  - Anti-anginal treatment with amlodipine, Coreg, Imdur.   2. Acute/chronic systolic CHF: EF 16-10% by echo. He has diuresed well but still has some volume overload and is requiring supplemental oxygen.  Creatinine is a bit lower this morning. - Continue IV Lasix today bid, likely convert to po tomorrow. - Continue enalapril, spironolactone, and beta blocker.  - Will get CXR to see if pulmonary edema has resolved.  3. COPD: Based on lung exam, I suspect he has a component of COPD.  Needs to quit smoking.  Now on scheduled nebulized Atrovent.  He might end up needing home oxygen with exertion. 4. Atrial fibrillation: Back in NSR on amiodarone.  Apixaban started yesterday. 5. Ambulate today.  To floor today, possibly home tomorrow.   Marca Ancona 03/25/2013 7:28 AM

## 2013-03-25 NOTE — Progress Notes (Signed)
Transferred to [id number] by wheelchair stable. Report given to RN, belongings with pt.

## 2013-03-26 ENCOUNTER — Encounter (HOSPITAL_COMMUNITY): Payer: Self-pay | Admitting: Physician Assistant

## 2013-03-26 ENCOUNTER — Other Ambulatory Visit: Payer: Self-pay | Admitting: Physician Assistant

## 2013-03-26 ENCOUNTER — Telehealth: Payer: Self-pay | Admitting: Physician Assistant

## 2013-03-26 DIAGNOSIS — I5023 Acute on chronic systolic (congestive) heart failure: Secondary | ICD-10-CM

## 2013-03-26 LAB — BASIC METABOLIC PANEL
BUN: 33 mg/dL — ABNORMAL HIGH (ref 6–23)
CO2: 28 mEq/L (ref 19–32)
Calcium: 9.1 mg/dL (ref 8.4–10.5)
Creatinine, Ser: 1.3 mg/dL (ref 0.50–1.35)
Glucose, Bld: 142 mg/dL — ABNORMAL HIGH (ref 70–99)

## 2013-03-26 LAB — CBC
Hemoglobin: 11.8 g/dL — ABNORMAL LOW (ref 13.0–17.0)
MCH: 31.3 pg (ref 26.0–34.0)
MCV: 91.2 fL (ref 78.0–100.0)
RBC: 3.77 MIL/uL — ABNORMAL LOW (ref 4.22–5.81)

## 2013-03-26 LAB — HEPATIC FUNCTION PANEL
Alkaline Phosphatase: 49 U/L (ref 39–117)
Bilirubin, Direct: 0.1 mg/dL (ref 0.0–0.3)
Indirect Bilirubin: 0.3 mg/dL (ref 0.3–0.9)
Total Bilirubin: 0.4 mg/dL (ref 0.3–1.2)

## 2013-03-26 MED ORDER — SPIRONOLACTONE 25 MG PO TABS: 12.5000 mg | ORAL_TABLET | Freq: Every day | ORAL | Status: DC

## 2013-03-26 MED ORDER — APIXABAN 5 MG PO TABS: 5.0000 mg | ORAL_TABLET | Freq: Two times a day (BID) | ORAL | Status: DC

## 2013-03-26 MED ORDER — AMLODIPINE BESYLATE 5 MG PO TABS: 5.0000 mg | ORAL_TABLET | Freq: Every day | ORAL | Status: DC

## 2013-03-26 MED ORDER — ATORVASTATIN CALCIUM 80 MG PO TABS: 80.0000 mg | ORAL_TABLET | Freq: Every evening | ORAL | Status: DC

## 2013-03-26 MED ORDER — FUROSEMIDE 40 MG PO TABS
40.0000 mg | ORAL_TABLET | Freq: Two times a day (BID) | ORAL | Status: DC
  Filled 2013-03-26 (×2): qty 1

## 2013-03-26 MED ORDER — FUROSEMIDE 40 MG PO TABS: 40.0000 mg | ORAL_TABLET | Freq: Two times a day (BID) | ORAL | Status: DC

## 2013-03-26 MED ORDER — CLOPIDOGREL BISULFATE 75 MG PO TABS: 75.0000 mg | ORAL_TABLET | Freq: Every day | ORAL | Status: DC

## 2013-03-26 MED ORDER — ALBUTEROL SULFATE HFA 108 (90 BASE) MCG/ACT IN AERS: 2.0000 | INHALATION_SPRAY | Freq: Four times a day (QID) | RESPIRATORY_TRACT | Status: DC | PRN

## 2013-03-26 MED ORDER — CARVEDILOL 12.5 MG PO TABS: 12.5000 mg | ORAL_TABLET | Freq: Two times a day (BID) | ORAL | Status: DC

## 2013-03-26 MED ORDER — ENALAPRIL MALEATE 10 MG PO TABS: 10.0000 mg | ORAL_TABLET | Freq: Two times a day (BID) | ORAL | Status: DC

## 2013-03-26 MED ORDER — AMIODARONE HCL 200 MG PO TABS: 200.0000 mg | ORAL_TABLET | Freq: Two times a day (BID) | ORAL | Status: DC

## 2013-03-26 MED ORDER — ISOSORBIDE MONONITRATE ER 30 MG PO TB24: 30.0000 mg | ORAL_TABLET | Freq: Every day | ORAL | Status: DC

## 2013-03-26 NOTE — Telephone Encounter (Signed)
New problem   Pt has TCM on 03/30/13 w/Scott per Danya PA calling.

## 2013-03-26 NOTE — Discharge Summary (Signed)
Discharge Summary   Patient ID: NAS WAFER MRN: [id number], DOB/AGE: 69-Nov-1947 69 y.o. Admit date: 03/21/2013 D/C date:     03/26/2013  Primary Cardiologist: Nahser  Primary Discharge Diagnoses:  1. CAD with NSTEMI this admission - cath showing occlusion of OM branch after touchdown of SVG-OM, likely the culprit, too small for intervention, med rx recommended - prior history: a) anterior MI in 1989. b) MI with CABG in 1998. c) Small NSTEMI s/p DES to SVG-OM 02/2012, newly recognized LV dysfunction at that time - started on statin: consider op f/u labs 2. Acute on chronic systolic CHF EF 25-30% 3. Suspected COPD - O2 sat to 89% on RA with ambulation 4. Transient atrial fibrillation, new - started on amiodarone, apixaban 5. HTN 6. PVD - severe R iliac disease by cath this admission - prior history: prior extensive endarterectomy of the right external iliac, common femoral bypass, prior R CEA 7. Hyperglycemia - A1c 7.2 noted at discharge, instructed to f/u PCP  Secondary Discharge Diagnoses:  1. Hyperlipidemia 2. Depression 3. Tobacco abuse 4. Duodenal ulcer age 62 5. H/o ischemic leg - with extensive endarterectomy 6. Anemia  7. H/o nocturnal oxygen desaturation   Hospital Course:Mr. Seyller is a 67 y/o M with PMH of CAD s/p CABG 1998 who presented to [facility] 03/21/13 with CP. He was last seen for NSTEMI and underwent cardiac cath 02/18/2012 that showed patent LIMA->LAD and patent SVG-acute marginal. His SVG->PDA was occluded and SVG->OM had a significant stenosis that was stented with a DES, LVEF was 35%. Since then patient has had occasional chest pain that is usually relieved by SL nitroglycerin. He reports that he is compliant with his medication and that he recently stopped smoking about 6 months ago. He presented to [facility] with chest pain that began while driving, described as a pressure with shortness of breath and diaphoresis. He denied denied nausea, vomiting,  radiation or palpitation. He took several SL NTG without relief then presented to the ER. In ER, his initial EKG showed sinus tachycardia (HR 115 bpm) with 2-3 mm ST elevation in lead aVR and V1 with reciprocal ST-depression. He received more SL nitroglycerin and Morphine 6 mg iv without relief and was subsequently started on a nitro/heparin drip prior to going emergently to the cath lab. His initial troponin was 0.18. Cardiac cath showed: IMPRESSIONS: 1. Non-ST elevation myocardial infarction related to occlusion of the antegrade limb of the obtuse marginal distal to the graft anastomosis. This vessel is small and was not felt to be a reasonable target to intervene upon because of the ostial saphenous vein graft stent and the small caliber of the vessel. Furthermore the patient's pain had completely resolved.  2. Total occlusion of the LAD, native right coronary, and mid circumflex. Greater than 50% native left main disease.  3. Patent DES stent in the ostium of the saphenous vein graft to the marginal, occlusion of the saphenous vein graft to the right coronary,.diffuse disease in the saphenous and graft to the acute arsenal of the right coronary.  4. Widely patent LIMA to the LAD with diffusely diseased LAD beyond the graft and insertion.  5. Severe disease in the right iliac which has been previously stented. Absent left femoral pulse. He was kept on IV heparin post-procedurally. He developed SOB which was felt to represent a/c systolic CHF. He was also kept on IV nitroglycerin post cath, which was also felt to help this CHF. HCTZ was discontinued and he was placed  on IV Lasix. Metoprolol was changed to Coreg. 2D echo showed EF 25-30% (previous EF 30%). On 03/23/13 he went into rapid atrial fibrillation. He was still on IV heparin at this point. He was placed on IV amiodarone with conversion to NSR. This was changed to oral amiodarone and he was started on apixaban. Thus ASA was discontinued. NTG was  changed to Imdur. Spironolactone was added. Amlodipine was decreased to allow room for BP. He did demonstrate desaturation with ambulation during this hospitalization, but on day of discharge and maintained a sat of 89% on RA (thus he did not qualify for home O2). It was suspected he had a component of COPD as well and smoking cessation was recommended. This morning he is feeling well. He was able to be changed to oral Lasix. He has had no further chest pain. Dr. Patty Sermons has seen and examined him today and feels he is stable for discharge.  Dr. Patty Sermons recommended close OP f/u and amiodarone 200mg  BID. Further adjustments can be made in followup. Dr. Patty Sermons feels he will need his lung function monitored as well. Arrangement of periodic monitoring for amiodarone use will be at the discretion of his primary cardiologist. TSH and free T4 were normal at baseline. LFTs were added on prior to discharge. The patient was instructed to f/u PCP for evaluation of COPD. At discharge his A1C was also noted to be 7.2 and he was instructed to follow up for this as well. He was also plugged into the [facility] for education visit for being new to Eliquis. Consider OP f/u labs given initiation of Lipitor.  Discharge Vitals: Blood pressure 134/70, pulse 67, temperature 98.2 F (36.8 C), temperature source Oral, resp. rate 18, height 5\' 8"  (1.727 m), weight 205 lb 1.6 oz (93.033 kg), SpO2 94.00%.  Labs: Lab Results  Component Value Date   WBC 10.3 03/26/2013   HGB 11.8* 03/26/2013   HCT 34.4* 03/26/2013   MCV 91.2 03/26/2013   PLT 163 03/26/2013     Recent Labs Lab 03/26/13 0625  NA 135  K 4.4  CL 98  CO2 28  BUN 33*  CREATININE 1.30  CALCIUM 9.1  PROT 6.6  BILITOT 0.4  ALKPHOS 49  ALT 17  AST 22  GLUCOSE 142*   Lab Results  Component Value Date   CHOL 147 03/22/2013   HDL 24* 03/22/2013   LDLCALC 87 03/22/2013   TRIG 178* 03/22/2013    Diagnostic Studies/Procedures   Dg Chest Port 1  View  03/25/2013   *RADIOLOGY REPORT*  Clinical Data: Evaluate pulmonary edema, persistent shortness of breath  PORTABLE CHEST - 1 VIEW  Comparison: 03/22/2013; 02/18/2012; 07/07/2008  Findings: Grossly unchanged enlarged cardiac silhouette and mediastinal contours given rotation.  Post CABG.  Overall improved aeration of the lungs with persistent cephalization of flow. Minimal residual right infrahilar heterogeneous opacities. Interval development of small bilateral effusions, right greater than left, with associated bibasilar opacities.  No pneumothorax. Left axillary surgical clips.  Unchanged bones.  IMPRESSION: 1.  Improved pulmonary edema with persistent pulmonary venous congestion. 2.  Development of small bilateral effusions with associated bibasilar atelectasis.   Original Report Authenticated By: Tacey Ruiz, MD   Dg Chest Port 1 View 03/22/2013   *RADIOLOGY REPORT*  Clinical Data: Myocardial infarct.  Coronary artery disease.  Chest pain.  PORTABLE CHEST - 1 VIEW  Comparison: 02/18/2012  Findings: Mild to moderate cardiomegaly again noted as well as prior CABG.  Diffuse interstitial infiltrates are  seen, consistent with interstitial edema.  There is no evidence of pulmonary consolidation or significant pleural effusion.  Surgical clips again seen in the left hemithorax.  IMPRESSION: Cardiomegaly and diffuse interstitial edema, consistent with congestive heart failure.   Original Report Authenticated By: Myles Rosenthal, M.D.    Cardiac catheterization this admission, please see full report and above for summary.  2D Echo 03/23/13 - Left ventricle: Inferior wall and septum more hypokinetic than the rest. Poor endocardial definition The cavity size was moderately dilated. Wall thickness was normal. Systolic function was severely reduced. The estimated ejection fraction was in the range of 25% to 30%. - Mitral valve: Valve area by pressure half-time: 1.22cm^2. - Left atrium: The atrium was mildly  dilated. - Atrial septum: No defect or patent foramen ovale was identified.  Discharge Medications     Medication List    STOP taking these medications       aspirin EC 81 MG tablet     BC HEADACHE PO     hydrochlorothiazide 25 MG tablet  Commonly known as:  HYDRODIURIL     metoprolol 100 MG tablet  Commonly known as:  LOPRESSOR     Potassium Gluconate 595 MG Caps     simvastatin 20 MG tablet  Commonly known as:  ZOCOR      TAKE these medications       albuterol 108 (90 BASE) MCG/ACT inhaler  Commonly known as:  PROVENTIL HFA;VENTOLIN HFA  Inhale 2 puffs into the lungs every 6 (six) hours as needed for wheezing or shortness of breath.     amiodarone 200 MG tablet  Commonly known as:  PACERONE  Take 1 tablet (200 mg total) by mouth 2 (two) times daily.     amLODipine 5 MG tablet  Commonly known as:  NORVASC  Take 1 tablet (5 mg total) by mouth daily.     apixaban 5 MG Tabs tablet  Commonly known as:  ELIQUIS  Take 1 tablet (5 mg total) by mouth 2 (two) times daily.     atorvastatin 80 MG tablet  Commonly known as:  LIPITOR  Take 1 tablet (80 mg total) by mouth every evening.     carvedilol 12.5 MG tablet  Commonly known as:  COREG  Take 1 tablet (12.5 mg total) by mouth 2 (two) times daily with a meal.     clopidogrel 75 MG tablet  Commonly known as:  PLAVIX  Take 1 tablet (75 mg total) by mouth daily.     enalapril 10 MG tablet  Commonly known as:  VASOTEC  Take 1 tablet (10 mg total) by mouth 2 (two) times daily.     furosemide 40 MG tablet  Commonly known as:  LASIX  Take 1 tablet (40 mg total) by mouth 2 (two) times daily.     isosorbide mononitrate 30 MG 24 hr tablet  Commonly known as:  IMDUR  Take 1 tablet (30 mg total) by mouth daily.     loratadine 10 MG tablet  Commonly known as:  CLARITIN  Take 10 mg by mouth every evening.     nitroGLYCERIN 0.4 MG SL tablet  Commonly known as:  NITROSTAT  Place 0.4 mg under the tongue every 5  (five) minutes as needed for chest pain.     spironolactone 25 MG tablet  Commonly known as:  ALDACTONE  Take 0.5 tablets (12.5 mg total) by mouth daily.        Disposition   The patient will be  discharged in stable condition to home. Discharge Orders   Future Appointments Provider Department Dept Phone   03/30/2013 12:10 PM [facility] Main Office [place]) [phone]   03/30/2013 3:40 PM Beatrice Lecher, PA-C [facility] Main Office [place]) [phone]   04/23/2013 9:30 AM [facility] [phone]   Future Orders Complete By Expires     Diet - low sodium heart healthy  As directed     Discharge instructions  As directed     Comments:      For patients with congestive heart failure, we give them these special instructions:  1. Follow a low-salt diet and watch your fluid intake. In general, you should not be taking in more than 2 liters of fluid per day (no more than 8 glasses per day). Some patients are restricted to less than 1.5 liters of fluid per day (no more than 6 glasses per day). This includes sources of water in foods like soup, coffee, tea, milk, etc. 2. Weigh yourself on the same scale at same time of day and keep a log. 3. Call your doctor: (Anytime you feel any of the following symptoms)  - 3-4 pound weight gain in 1-2 days or 2 pounds overnight  - Shortness of breath, with or without a dry hacking cough  - Swelling in the hands, feet or stomach  - If you have to sleep on extra pillows at night in order to breathe   IT IS IMPORTANT TO LET YOUR DOCTOR KNOW EARLY ON IF YOU ARE HAVING SYMPTOMS SO WE CAN HELP YOU!  Patients on amiodarone may need intermittent check-ups of other organ systems, such as lungs, thyroid, eyes, and liver function. Please talk to your doctor at your follow-up appointments about what monitoring may be needed for you.  Your blood sugars were elevated here in the hospital  which would be consistent with diabetes. Please call your primary care doctor today to discuss further evaluation. Follow up with your primary care doctor for evaluation of COPD (lung disease). Stop smoking.    Increase activity slowly  As directed     Comments:      No driving for 1 week. No lifting over 10 lbs for 2 weeks. No sexual activity for 2 weeks. Keep procedure site clean & dry. If you notice increased pain, swelling, bleeding or pus, call/return!  You may shower, but no soaking baths/hot tubs/pools for 1 week.      Follow-up Information   Follow up with Primary Care Provider. (See discharge instructions below)       Follow up with Tereso Newcomer, PA-C. (03/30/13 at 3:40pm with labwork)    Contact information:   [address] [place] [address] [phone] [facility]      Follow up with [facility]. (Education Visit in our [facility] 04/23/13 at 9:30am since you are starting Eliquis here in the hospital)    Contact information:   [address] [place] [address] [phone] [facility]         Duration of Discharge Encounter: Greater than 30 minutes including physician and PA time.  Signed, Dayna Dunn PA-C 03/26/2013, 1:52 PM

## 2013-03-26 NOTE — Progress Notes (Signed)
Subjective:  The patient feels well this morning.  No chest pain or shortness of breath.  He is oxygenating well on room air.  He has tolerated ambulation in the hall.  Rhythm is normal sinus rhythm with PACs.  Objective:  Vital Signs in the last 24 hours: Temp:  [97.9 F (36.6 C)-98.4 F (36.9 C)] 98.2 F (36.8 C) (06/19 0556) Pulse Rate:  [55-69] 67 (06/19 0556) Resp:  [17-22] 18 (06/19 0556) BP: (96-135)/(43-70) 134/70 mmHg (06/19 0556) SpO2:  [91 %-99 %] 94 % (06/19 0855) Weight:  [205 lb 1.6 oz (93.033 kg)-206 lb 12.7 oz (93.8 kg)] 205 lb 1.6 oz (93.033 kg) (06/19 0556)  Intake/Output from previous day: 06/18 0701 - 06/19 0700 In: 740 [P.O.:740] Out: 2175 [Urine:2175] Intake/Output from this shift: Total I/O In: 240 [P.O.:240] Out: -   . albuterol  2.5 mg Nebulization BID  . amiodarone  400 mg Oral BID  . amLODipine  5 mg Oral Daily  . apixaban  5 mg Oral BID  . atorvastatin  80 mg Oral q1800  . carvedilol  12.5 mg Oral BID WC  . clopidogrel  75 mg Oral Q breakfast  . enalapril  10 mg Oral BID  . furosemide  40 mg Oral BID  . ipratropium  0.5 mg Nebulization BID  . isosorbide mononitrate  30 mg Oral Daily  .  morphine injection  6 mg Intravenous Once  . spironolactone  12.5 mg Oral Daily      Physical Exam: The patient appears to be in no distress.  Head and neck exam reveals that the pupils are equal and reactive.  The extraocular movements are full.  There is no scleral icterus.  Mouth and pharynx are benign.  No lymphadenopathy.  No carotid bruits.  The jugular venous pressure is normal.  Thyroid is not enlarged or tender.  Chest is clear to percussion and auscultation.  No rales or rhonchi.  Expansion of the chest is symmetrical.  Heart reveals no abnormal lift or heave.  First and second heart sounds are normal.  There is no  gallop rub or click.  Soft apical systolic murmur.  The abdomen is soft and nontender.  Bowel sounds are normoactive.  There is no  hepatosplenomegaly or mass.  There are no abdominal bruits.  Extremities reveal no phlebitis or edema.   There is no cyanosis or clubbing.  Neurologic exam is normal strength and no lateralizing weakness.  No sensory deficits.  Integument reveals no rash  Lab Results:  Recent Labs  03/25/13 0358 03/26/13 0625  WBC 10.4 10.3  HGB 10.9* 11.8*  PLT 132* 163    Recent Labs  03/25/13 0358 03/26/13 0625  NA 130* 135  K 3.6 4.4  CL 93* 98  CO2 28 28  GLUCOSE 142* 142*  BUN 33* 33*  CREATININE 1.33 1.30   No results found for this basename: TROPONINI, CK, MB,  in the last 72 hours Hepatic Function Panel No results found for this basename: PROT, ALBUMIN, AST, ALT, ALKPHOS, BILITOT, BILIDIR, IBILI,  in the last 72 hours No results found for this basename: CHOL,  in the last 72 hours No results found for this basename: PROTIME,  in the last 72 hours  Imaging: Dg Chest Port 1 View  03/25/2013   *RADIOLOGY REPORT*  Clinical Data: Evaluate pulmonary edema, persistent shortness of breath  PORTABLE CHEST - 1 VIEW  Comparison: 03/22/2013; 02/18/2012; 07/07/2008  Findings: Grossly unchanged enlarged cardiac silhouette and mediastinal contours  given rotation.  Post CABG.  Overall improved aeration of the lungs with persistent cephalization of flow. Minimal residual right infrahilar heterogeneous opacities. Interval development of small bilateral effusions, right greater than left, with associated bibasilar opacities.  No pneumothorax. Left axillary surgical clips.  Unchanged bones.  IMPRESSION: 1.  Improved pulmonary edema with persistent pulmonary venous congestion. 2.  Development of small bilateral effusions with associated bibasilar atelectasis.   Original Report Authenticated By: Tacey Ruiz, MD    Cardiac Studies: Telemetry shows normal sinus rhythm with PACs Assessment/Plan:  1. CAD: NSTEMI. Cath Saturday with occlusion of OM branch after touchdown of SVG-OM. This is the likely  culprit. It was deemed too small for intervention. No further chest pain.  - Continue Plavix, statin. Stopped aspirin since he is on apixaban.  - Anti-anginal treatment with amlodipine, Coreg, Imdur.  2. Acute/chronic systolic CHF: EF 91-47% by echo. He has diuresed well.  No longer requiring supplemental oxygen. Creatinine is a bit lower this morning.  - Have switched to oral Lasix today  - Continue enalapril, spironolactone, and beta blocker.  -Chest x-ray yesterday is improved 3. COPD: Based on lung exam, I suspect he has a component of COPD. Needs to quit smoking. Now on scheduled nebulized Atrovent.  4. Atrial fibrillation: Back in NSR on amiodarone. Apixaban started yesterday.  5. tolerating ambulation well.  Okay for discharge today with short-term followup in one week with Dr. Elease Hashimoto or NP/PA.  Recheck BMET at office visit   LOS: 5 days    Cassell Clement 03/26/2013, 9:35 AM

## 2013-03-26 NOTE — Progress Notes (Signed)
Pt being d/c to home, pt given education on finding PCP and doing follow up, pt given dc instructions on medications, follow up appointments and s/s of CHF, pt verbalized understanding, pt left via wheelchair with family, pt stable

## 2013-03-26 NOTE — Progress Notes (Signed)
SATURATION QUALIFICATIONS: (This note is used to comply with regulatory documentation for home oxygen)  Patient Saturations on Room Air at Rest = 90%  Patient Saturations on Room Air while Ambulating = 89%  Patient Saturations on 66 Liters of oxygen while Ambulating = 0 (room air)%  Please briefly explain why patient needs home oxygen: pt ambulated in hall way O2 maintained 89% on room air, no c/o SOB

## 2013-03-26 NOTE — Progress Notes (Signed)
UR chart review completed.  

## 2013-03-26 NOTE — Progress Notes (Signed)
CARDIAC REHAB PHASE I   Pt for d/c and walked today. Discussed with pt diet including DM diet as Hgb A1C is 7.2. Notified RN as well. Encouraged increase in ex. Reviewed NTG. Pt sts he cannot afford CRPII. Highly encouraged pt to get PCP to eval for DM. Pt sts he does not have $ for copay. [id number]  Elissa Lovett Garwin CES, ACSM 03/26/2013 11:48 AM

## 2013-03-26 NOTE — Progress Notes (Signed)
Pt OOB to chair this AM and up ad lib in room. Pt with no complaints. Baron Hamper, RN 03/26/2013

## 2013-03-27 NOTE — Telephone Encounter (Signed)
Patient contacted regarding discharge from [facility]  Patient understands to follow up with provider Wende Mott on 6/23 at 12:10 at [facility]. Patient understands discharge instructions? yes Patient understands medications and regiment? yes Patient understands to bring all medications to this visit? yes

## 2013-03-30 ENCOUNTER — Ambulatory Visit (INDEPENDENT_AMBULATORY_CARE_PROVIDER_SITE_OTHER): Payer: Medicare Other | Admitting: Physician Assistant

## 2013-03-30 ENCOUNTER — Other Ambulatory Visit: Payer: Medicare Other

## 2013-03-30 ENCOUNTER — Encounter: Payer: Self-pay | Admitting: Physician Assistant

## 2013-03-30 VITALS — BP 140/68 | HR 66 | Ht 68.5 in | Wt 201.8 lb

## 2013-03-30 DIAGNOSIS — I5022 Chronic systolic (congestive) heart failure: Secondary | ICD-10-CM

## 2013-03-30 DIAGNOSIS — I4891 Unspecified atrial fibrillation: Secondary | ICD-10-CM

## 2013-03-30 DIAGNOSIS — I10 Essential (primary) hypertension: Secondary | ICD-10-CM

## 2013-03-30 DIAGNOSIS — J449 Chronic obstructive pulmonary disease, unspecified: Secondary | ICD-10-CM

## 2013-03-30 DIAGNOSIS — E785 Hyperlipidemia, unspecified: Secondary | ICD-10-CM

## 2013-03-30 DIAGNOSIS — I739 Peripheral vascular disease, unspecified: Secondary | ICD-10-CM

## 2013-03-30 DIAGNOSIS — I2589 Other forms of chronic ischemic heart disease: Secondary | ICD-10-CM

## 2013-03-30 DIAGNOSIS — I25810 Atherosclerosis of coronary artery bypass graft(s) without angina pectoris: Secondary | ICD-10-CM

## 2013-03-30 MED ORDER — SPIRONOLACTONE 25 MG PO TABS: 12.5000 mg | ORAL_TABLET | Freq: Every day | ORAL | Status: DC

## 2013-03-30 MED ORDER — AMIODARONE HCL 200 MG PO TABS: ORAL_TABLET | ORAL | Status: DC

## 2013-03-30 NOTE — Progress Notes (Signed)
[address], [address] [place], [place]  [address] Phone: [phone] Fax:  [phone]  Date:  03/30/2013   ID:  [name], DOB November 17, 1945, MRN [id number]  PCP:  No PCP Per Patient  Cardiologist:  Dr. Delane Ginger     History of Present Illness: [name] is a 67 y.o. male who returns for f/u after a recent admission to the hospital with a NSTEMI c/b a/c systolic CHF.  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   He was admitted 6/14-6/19 after presenting to the hospital with chest pain. Cardiac markers were abnormal.  He had prolonged chest pain and underwent urgent cardiac catheterization. LHC 03/21/13:  SVG-RCA occluded, SVG-acute marginal of the RCA proximal 80% (not amenable to PCI), SVG-OM patent, LIMA-LAD patent. Patient did have an occlusion of the antegrade limb of the obtuse marginal distal to the graft anastomosis. This vessel was small and not felt to be a reasonable target for PCI.  DES in the SVG-OM was patent. He was noted to have severe disease in the right iliac which had previously been stented.  Medical therapy was recommended. He developed volume overload post procedure and he was placed on Lasix.  Metoprolol was changed to carvedilol.  Echo 03/23/13: EF 25-30%, mild LAE.  Patient also developed trial fibrillation with RVR. He converted on IV amiodarone. He was also started on Apixaban. Spironolactone was added. He did have some hypoxia with ambulation. Oxygen saturation remained above 89%.  It was suspected that he does have COPD. Smoking cessation was recommended.  He was d/c on proventil prn.    Since d/c, he is doing well.  No further chest pain.  Breathing is much better.  No orthopnea, PND, edema.  No syncope.    Labs (6/16):  K 4.4, Cr 1.30, ALT 17, LDL 87, Hgb 11.8, TSH 2.303  Wt Readings from Last 3 Encounters:  03/30/13 201  lb 12.8 oz (91.536 kg)  03/26/13 205 lb 1.6 oz (93.033 kg)  03/26/13 205 lb 1.6 oz (93.033 kg)     Past Medical History  Diagnosis Date  . Hyperlipidemia   . Hypertension   . Depression   . Tobacco abuse   . Duodenal ulcer     AGE 34  . PVD (peripheral vascular disease)     prior extensive endarterectomy of the right external iliac, common femoral bypass, prior R CEA. R iliac disease noted during 03/2013 cath.  . Ischemic leg 2009    with extensive endarterectomy  . Coronary artery disease     a. anterior MI in 1989. b. MI with CABG in 1998. c. Small NSTEMI s/p DES to SVG-OM 02/2012, newly recognized LV dysfunction at that time. d. NSTEMI 03/2013: occlusion of OM branch after touchdown of SVG, likely the culprit, too small for PCI, for med rx.  . Chronic systolic CHF (congestive heart failure)     a. EF 35% by cath 02/2012. b. EF 25-30% by echo 03/2013.  Marland Kitchen Anemia     Instructed 02/2012 to f/u with PCP  . Nocturnal oxygen desaturation 02/2012  . COPD (chronic obstructive pulmonary disease)     a. Suspected during admit 03/2013.  Marland Kitchen Atrial fibrillation     a. Transient during 03/2013 admission, started on apixaban/amiodarone.    Current Outpatient Prescriptions  Medication Sig Dispense Refill  .  albuterol (PROVENTIL HFA;VENTOLIN HFA) 108 (90 BASE) MCG/ACT inhaler Inhale 2 puffs into the lungs every 6 (six) hours as needed for wheezing or shortness of breath.  1 Inhaler  1  . amiodarone (PACERONE) 200 MG tablet Take 1 tablet (200 mg total) by mouth 2 (two) times daily.  60 tablet  0  . amLODipine (NORVASC) 5 MG tablet Take 1 tablet (5 mg total) by mouth daily.  30 tablet  3  . apixaban (ELIQUIS) 5 MG TABS tablet Take 1 tablet (5 mg total) by mouth 2 (two) times daily.  60 tablet  3  . atorvastatin (LIPITOR) 80 MG tablet Take 1 tablet (80 mg total) by mouth every evening.  30 tablet  3  . carvedilol (COREG) 12.5 MG tablet Take 1 tablet (12.5 mg total) by mouth 2 (two) times daily with a meal.   60 tablet  3  . clopidogrel (PLAVIX) 75 MG tablet Take 1 tablet (75 mg total) by mouth daily.      . enalapril (VASOTEC) 10 MG tablet Take 1 tablet (10 mg total) by mouth 2 (two) times daily.  60 tablet  3  . furosemide (LASIX) 40 MG tablet Take 1 tablet (40 mg total) by mouth 2 (two) times daily.  60 tablet  3  . isosorbide mononitrate (IMDUR) 30 MG 24 hr tablet Take 1 tablet (30 mg total) by mouth daily.  30 tablet  3  . loratadine (CLARITIN) 10 MG tablet Take 10 mg by mouth every evening.      . nitroGLYCERIN (NITROSTAT) 0.4 MG SL tablet Place 0.4 mg under the tongue every 5 (five) minutes as needed for chest pain.      Marland Kitchen spironolactone (ALDACTONE) 25 MG tablet Take 12.5 mg by mouth daily. Pt has been 0.5 tablet twice a day       No current facility-administered medications for this visit.    Allergies:   No Known Allergies  Social History:  The patient  reports that he has been smoking Cigarettes.  He has a 25 pack-year smoking history. He does not have any smokeless tobacco history on file. He reports that he does not drink alcohol or use illicit drugs.   ROS:  Please see the history of present illness.   He has significant leg pain from PAD.   All other systems reviewed and negative.   PHYSICAL EXAM: VS:  BP 140/68  Pulse 66  Ht 5' 8.5" (1.74 m)  Wt 201 lb 12.8 oz (91.536 kg)  BMI 30.23 kg/m2 Well nourished, well developed, in no acute distress HEENT: normal Neck: no JVD Cardiac:  normal S1, S2; RRR; no murmur Lungs:  Decreased breath sounds bilaterally, no wheezing, rhonchi or rales Abd: soft, nontender, no hepatomegaly Ext: no edema; right groin without hematoma or bruit  Skin: warm and dry Neuro:  CNs 2-12 intact, no focal abnormalities noted  EKG:  NSR, HR 66, diffuse inf-lat ST depression, no change from prior tracing     ASSESSMENT AND PLAN:  1. CAD:  Stable s/p most recent NSTEMI.  Culprit likely occlusion of MO br after touchdown of SVG (too small for PCI).  Med Rx  recommended.  Continue Plavix (no ASA as he is on apixaban) and statin. 2. Chronic Systolic CHF:  Stable volume.  Continue current Rx.  Check BMET today. 3. Ischemic Cardiomyopathy:  Continue beta blocker, spironolactone, ACE inhibitor.  Will check with Dr. Delane Ginger to see if he thinks we should refer to EP for AICD.  4. Atrial Fibrillation:  Maintaining NSR.  Reduce amiodarone to 200 mg QD after 2 weeks.  Check PFTs with DLCO.  Recent TSH and LFTs ok.  Continue Apixaban.  He has f/u with CVRR clinic next month.  Will defer whether to continue Amiodarone to Dr. Delane Ginger. 5. PAD:  He has significant claudication.  Will refer back to Dr. Hart Rochester for f/u. 6. Carotid Stenosis:  F/u with [facility]. 7. COPD:  Check PFTs.  Continue Albuterol.  Refer to PCP. 8. Tobacco Abuse:  He quit smoking. 9. Hypertension:  Borderline control.  Continue current Rx.  Increase enalapril to 20 mg bid if needed. 10. Hyperlipidemia:  Continue statin.  Check FLP and LFTs in 4-6 weeks. 11. Disposition:  F/u with Dr. Delane Ginger in 4-6 weeks.   Signed, Tereso Newcomer, PA-C  03/30/2013 4:36 PM

## 2013-03-30 NOTE — Patient Instructions (Addendum)
PLEASE SCHEDULE FOR PULMONARY FUNCTION TEST WITH DLCO TO BE DONE AT [facility]; DX AMIODARONE THERAPY AND COPD  PLEASE REFER TO PRIMARY CARE DX HTN, COPD  PLEASE ARRANGE FOR FOLLOW UP WITH DR. Hart Rochester FOR PAD;  CHANGE SPIRONOLACTONE TO 1/2 TAB ON TIME A DAY  LAB TODAY; BMET  FASTING LIPID AND LIVER PANEL TO BE DONE IN 4-6 WEEKS  AS OF 04/08/13 YOU WILL DECREASE AMIODARONE TO 200 MG DAILY

## 2013-03-31 ENCOUNTER — Other Ambulatory Visit: Payer: Self-pay | Admitting: *Deleted

## 2013-03-31 DIAGNOSIS — I25810 Atherosclerosis of coronary artery bypass graft(s) without angina pectoris: Secondary | ICD-10-CM

## 2013-03-31 LAB — BASIC METABOLIC PANEL
BUN: 23 mg/dL (ref 6–23)
Chloride: 95 mEq/L — ABNORMAL LOW (ref 96–112)
Potassium: 4.3 mEq/L (ref 3.5–5.1)
Sodium: 133 mEq/L — ABNORMAL LOW (ref 135–145)

## 2013-04-01 ENCOUNTER — Other Ambulatory Visit: Payer: Self-pay | Admitting: *Deleted

## 2013-04-01 DIAGNOSIS — I739 Peripheral vascular disease, unspecified: Secondary | ICD-10-CM

## 2013-04-02 ENCOUNTER — Telehealth: Payer: Self-pay | Admitting: *Deleted

## 2013-04-02 ENCOUNTER — Other Ambulatory Visit: Payer: Self-pay

## 2013-04-02 DIAGNOSIS — L98499 Non-pressure chronic ulcer of skin of other sites with unspecified severity: Secondary | ICD-10-CM

## 2013-04-02 DIAGNOSIS — I2510 Atherosclerotic heart disease of native coronary artery without angina pectoris: Secondary | ICD-10-CM

## 2013-04-02 DIAGNOSIS — I739 Peripheral vascular disease, unspecified: Secondary | ICD-10-CM

## 2013-04-02 DIAGNOSIS — I214 Non-ST elevation (NSTEMI) myocardial infarction: Secondary | ICD-10-CM

## 2013-04-02 NOTE — Telephone Encounter (Signed)
Message copied by Tarri Fuller on Thu Apr 02, 2013 12:56 PM ------      Message from: [place], [place] T      Created: Mon Mar 30, 2013 10:37 PM      Regarding: refer to EP       Okey Regal      See below      Please refer to EP for possible AICD.      Notify patient I reviewed with Dr. Delane Ginger.      If he has questions, I am happy to speak to him.  We did not discuss this when I saw him but noted that his EF was low enough to qualify as I was finishing with his chart.      Thanks      Tereso Newcomer, PA-C        03/30/2013 10:38 PM              ----- Message -----         From: Vesta Mixer, MD         Sent: 03/30/2013   6:15 PM           To: Beatrice Lecher, PA-C            Yes, please have his see EP for follow up of his ICD.      Ok to continue amio until I see him again unless EP wants to something different.                  ----- Message -----         From: Beatrice Lecher, PA-C         Sent: 03/30/2013   5:14 PM           To: Vesta Mixer, MD            Phil:      1. Do you want him to see EP for ? ICD?      2. I am keeping him on amiodarone until he sees you back.  Has PFTs pending.  Not sure if he is a great candidate long term or not.      Scott              ------

## 2013-04-02 NOTE — Telephone Encounter (Signed)
Follow-up: ° ° ° °Patient called in returning your call.  Please call back. °

## 2013-04-02 NOTE — Progress Notes (Signed)
lmptcb to go over recommendations from Tereso Newcomer, [facility], I will put in referral for EP today

## 2013-04-02 NOTE — Telephone Encounter (Signed)
Message copied by Antony Odea on Thu Apr 02, 2013  3:55 PM ------      Message from: Omar Person B      Created: Wed Apr 01, 2013  4:18 PM      Regarding: appts        04-14-13 @ 11:30   PFT @ [facility]      7:30-14 @ 10:30 with Rene Kocher NP for Primary Care      06-09-13 @ 1 pm   Dr. Hart Rochester  ------

## 2013-04-02 NOTE — Telephone Encounter (Signed)
pt notified about needing referral to EP for possible AICD per recommendations from Dr. Elease Hashimoto and and Tereso Newcomer, [facility]. advised scheduling will call to make appt.

## 2013-04-02 NOTE — Telephone Encounter (Signed)
Noted  

## 2013-04-02 NOTE — Telephone Encounter (Signed)
lmptcb to go over recommendations from Scott Weaver, PAC, I will put in referral for EP today 

## 2013-04-02 NOTE — Telephone Encounter (Signed)
pt notified about needing referral to EP for possible AICD per recommendations from Dr. Nahser and and Scott Weaver, PAC. advised scheduling will call to make appt. 

## 2013-04-13 ENCOUNTER — Encounter: Payer: Self-pay | Admitting: Cardiovascular Disease

## 2013-04-14 ENCOUNTER — Telehealth: Payer: Self-pay | Admitting: *Deleted

## 2013-04-14 ENCOUNTER — Ambulatory Visit (HOSPITAL_COMMUNITY)
Admission: RE | Admit: 2013-04-14 | Discharge: 2013-04-14 | Disposition: A | Discharge status: Home / Self Care | Payer: Medicare Other | Source: Ambulatory Visit | Attending: Physician Assistant | Admitting: Physician Assistant

## 2013-04-14 ENCOUNTER — Other Ambulatory Visit (INDEPENDENT_AMBULATORY_CARE_PROVIDER_SITE_OTHER): Payer: Medicare Other

## 2013-04-14 DIAGNOSIS — I25810 Atherosclerosis of coronary artery bypass graft(s) without angina pectoris: Secondary | ICD-10-CM

## 2013-04-14 DIAGNOSIS — I10 Essential (primary) hypertension: Secondary | ICD-10-CM

## 2013-04-14 DIAGNOSIS — J449 Chronic obstructive pulmonary disease, unspecified: Secondary | ICD-10-CM

## 2013-04-14 DIAGNOSIS — J4489 Other specified chronic obstructive pulmonary disease: Secondary | ICD-10-CM | POA: Insufficient documentation

## 2013-04-14 LAB — BASIC METABOLIC PANEL
CO2: 27 mEq/L (ref 19–32)
Calcium: 8.9 mg/dL (ref 8.4–10.5)
Chloride: 97 mEq/L (ref 96–112)
Glucose, Bld: 122 mg/dL — ABNORMAL HIGH (ref 70–99)
Sodium: 131 mEq/L — ABNORMAL LOW (ref 135–145)

## 2013-04-14 MED ORDER — ALBUTEROL SULFATE (5 MG/ML) 0.5% IN NEBU
2.5000 mg | INHALATION_SOLUTION | Freq: Once | RESPIRATORY_TRACT | Status: AC
  Administered 2013-04-14: 2.5 mg via RESPIRATORY_TRACT

## 2013-04-14 MED ORDER — FUROSEMIDE 40 MG PO TABS: 40.0000 mg | ORAL_TABLET | Freq: Every day | ORAL | Status: DC

## 2013-04-14 NOTE — Telephone Encounter (Signed)
Message copied by Tarri Fuller on Tue Apr 14, 2013  5:38 PM ------      Message from: [place], [place] T      Created: Tue Apr 14, 2013  5:34 PM       Creatinine up      Decrease Lasix to 40 mg QD.      Take extra Lasix 40 mg if weight up 3 lbs in one day or 5 lbs in one week      Check BMET in 1 week      Tereso Newcomer, PA-C        04/14/2013 5:34 PM ------

## 2013-04-14 NOTE — Telephone Encounter (Signed)
pt notified about lab results and to decrease lasix to 40 mg daily, bmet to be done on 7/17 w/CVRR appt ok per Lorin Picket W. PA. Pt also advised tcb if weight is up 3 lb's x 1 day or 5 lb's x 5 days, pt verbalized understanding to all instructions

## 2013-04-15 ENCOUNTER — Encounter (INDEPENDENT_AMBULATORY_CARE_PROVIDER_SITE_OTHER): Payer: Medicare Other | Admitting: *Deleted

## 2013-04-15 ENCOUNTER — Other Ambulatory Visit (INDEPENDENT_AMBULATORY_CARE_PROVIDER_SITE_OTHER): Payer: Medicare Other | Admitting: *Deleted

## 2013-04-15 DIAGNOSIS — I739 Peripheral vascular disease, unspecified: Secondary | ICD-10-CM

## 2013-04-15 DIAGNOSIS — Z48812 Encounter for surgical aftercare following surgery on the circulatory system: Secondary | ICD-10-CM

## 2013-04-16 ENCOUNTER — Other Ambulatory Visit: Payer: Self-pay | Admitting: Nurse Practitioner

## 2013-04-16 ENCOUNTER — Telehealth: Payer: Self-pay | Admitting: Cardiovascular Disease

## 2013-04-16 NOTE — Telephone Encounter (Signed)
meds reviewed; Pt taking double the amount of amiodarone still/ he did not reduce 04/08/13 to 200 mg daily. Pt taking 1/2 of the prescribed dose of carvedilol- taking 12.5 mg once daily vs bid Pt doesn't  have plavix nor eliquis at home.   I called pharmacy and spoke with Marin Olp- PharmD She states she has the scripts for plavix and eliquis but pt has not picked them up.  Pt last filled plavix 12/22/12 90 day supply.  I reviewed meds/ pill minder box use/ use of meds and instructions for 30 minutes in great detail. Pt will pick up meds today.  Pt feels well currently. Episode happened last night/ no bp or pulse taken. He felt dizzy and near syncope.   Pt will call with further episodes of hot flashes/ dizziness.

## 2013-04-16 NOTE — Telephone Encounter (Signed)
New Prob      Pt c/o of feeling very hot and feeling as though he is going to pass out. Pt would like to speak nurse.

## 2013-04-17 NOTE — Telephone Encounter (Signed)
He needs to follow our advice re: medication doses.

## 2013-04-20 ENCOUNTER — Ambulatory Visit: Payer: Medicare Other | Admitting: Internal Medicine

## 2013-04-23 ENCOUNTER — Ambulatory Visit (INDEPENDENT_AMBULATORY_CARE_PROVIDER_SITE_OTHER): Payer: Medicare Other | Admitting: *Deleted

## 2013-04-23 ENCOUNTER — Other Ambulatory Visit (INDEPENDENT_AMBULATORY_CARE_PROVIDER_SITE_OTHER): Payer: Medicare Other

## 2013-04-23 ENCOUNTER — Other Ambulatory Visit: Payer: Self-pay | Admitting: *Deleted

## 2013-04-23 DIAGNOSIS — I10 Essential (primary) hypertension: Secondary | ICD-10-CM

## 2013-04-23 DIAGNOSIS — I5022 Chronic systolic (congestive) heart failure: Secondary | ICD-10-CM

## 2013-04-23 DIAGNOSIS — I4891 Unspecified atrial fibrillation: Secondary | ICD-10-CM

## 2013-04-23 LAB — BASIC METABOLIC PANEL
BUN: 20 mg/dL (ref 6–23)
CO2: 27 mEq/L (ref 19–32)
Chloride: 98 mEq/L (ref 96–112)
Creatinine, Ser: 1.5 mg/dL (ref 0.4–1.5)
Glucose, Bld: 127 mg/dL — ABNORMAL HIGH (ref 70–99)

## 2013-04-23 MED ORDER — APIXABAN 5 MG PO TABS: 5.0000 mg | ORAL_TABLET | Freq: Two times a day (BID) | ORAL | Status: DC

## 2013-04-23 MED ORDER — CARVEDILOL 12.5 MG PO TABS: 12.5000 mg | ORAL_TABLET | Freq: Two times a day (BID) | ORAL | Status: DC

## 2013-04-23 MED ORDER — AMIODARONE HCL 200 MG PO TABS: ORAL_TABLET | ORAL | Status: DC

## 2013-04-23 MED ORDER — ISOSORBIDE MONONITRATE ER 30 MG PO TB24: 30.0000 mg | ORAL_TABLET | Freq: Every day | ORAL | Status: DC

## 2013-04-23 MED ORDER — FUROSEMIDE 40 MG PO TABS: 40.0000 mg | ORAL_TABLET | Freq: Every day | ORAL | Status: DC

## 2013-04-23 NOTE — Telephone Encounter (Signed)
Fax Received. Refill Completed. [name] (R.M.A)   

## 2013-04-23 NOTE — Progress Notes (Signed)
Eliquis starting on tomorrow, 04/24/13. Pt is aware to take 5mg s Q 12 hrs.

## 2013-04-24 ENCOUNTER — Telehealth: Payer: Self-pay | Admitting: Cardiovascular Disease

## 2013-04-24 DIAGNOSIS — I10 Essential (primary) hypertension: Secondary | ICD-10-CM

## 2013-04-24 DIAGNOSIS — I25810 Atherosclerosis of coronary artery bypass graft(s) without angina pectoris: Secondary | ICD-10-CM

## 2013-04-24 DIAGNOSIS — I5022 Chronic systolic (congestive) heart failure: Secondary | ICD-10-CM

## 2013-04-24 DIAGNOSIS — E785 Hyperlipidemia, unspecified: Secondary | ICD-10-CM

## 2013-04-24 DIAGNOSIS — I739 Peripheral vascular disease, unspecified: Secondary | ICD-10-CM

## 2013-04-24 DIAGNOSIS — I2589 Other forms of chronic ischemic heart disease: Secondary | ICD-10-CM

## 2013-04-24 DIAGNOSIS — I4891 Unspecified atrial fibrillation: Secondary | ICD-10-CM

## 2013-04-24 DIAGNOSIS — J449 Chronic obstructive pulmonary disease, unspecified: Secondary | ICD-10-CM

## 2013-04-24 MED ORDER — ATORVASTATIN CALCIUM 80 MG PO TABS: 80.0000 mg | ORAL_TABLET | Freq: Every evening | ORAL | Status: AC

## 2013-04-24 MED ORDER — ENALAPRIL MALEATE 10 MG PO TABS: 10.0000 mg | ORAL_TABLET | Freq: Two times a day (BID) | ORAL | Status: DC

## 2013-04-24 MED ORDER — NITROGLYCERIN 0.4 MG SL SUBL: 0.4000 mg | SUBLINGUAL_TABLET | SUBLINGUAL | Status: AC | PRN

## 2013-04-24 MED ORDER — AMLODIPINE BESYLATE 5 MG PO TABS: 5.0000 mg | ORAL_TABLET | Freq: Every day | ORAL | Status: DC

## 2013-04-24 NOTE — Telephone Encounter (Signed)
Needs refill of Spironolactone 25 mg refilled. After discussing with patient he states that he is taking 1/2 tablet twice a day (chart says once daily), someone called him last week and told him to increase. Will forward to Dr Elease Hashimoto for review

## 2013-04-24 NOTE — Progress Notes (Signed)
Pt.notified

## 2013-04-24 NOTE — Telephone Encounter (Signed)
New problem   Pt is having trouble getting his meds refilled due to changing pharmacy--he's running out of meds and needs someone to call him

## 2013-04-24 NOTE — Telephone Encounter (Signed)
Ok for him to take as he is

## 2013-04-27 ENCOUNTER — Encounter: Payer: Self-pay | Admitting: Vascular Surgery

## 2013-04-27 MED ORDER — SPIRONOLACTONE 25 MG PO TABS: 12.5000 mg | ORAL_TABLET | Freq: Two times a day (BID) | ORAL | Status: DC

## 2013-04-27 NOTE — Telephone Encounter (Signed)
Refill completed.

## 2013-04-27 NOTE — Telephone Encounter (Signed)
PA done for eliquis, pt and pharmacy informed.

## 2013-04-27 NOTE — Telephone Encounter (Signed)
We did not have samples but I have a 30 day trial card he can take to the pharmacy. Placed at desk.

## 2013-04-27 NOTE — Telephone Encounter (Signed)
When i called pt he stated he still hasn't started eliquis, he now states he thinks he needs PA for med, I told pt I will leave samples at desk and I will call pharmacy to see what is needed since I have not heard from the pharmacy.

## 2013-04-28 ENCOUNTER — Telehealth: Payer: Self-pay | Admitting: Cardiology

## 2013-04-28 ENCOUNTER — Ambulatory Visit (INDEPENDENT_AMBULATORY_CARE_PROVIDER_SITE_OTHER): Payer: Medicare Other | Admitting: Vascular Surgery

## 2013-04-28 ENCOUNTER — Encounter: Payer: Self-pay | Admitting: *Deleted

## 2013-04-28 ENCOUNTER — Other Ambulatory Visit: Payer: Self-pay | Admitting: *Deleted

## 2013-04-28 ENCOUNTER — Other Ambulatory Visit (INDEPENDENT_AMBULATORY_CARE_PROVIDER_SITE_OTHER): Payer: Medicare Other

## 2013-04-28 ENCOUNTER — Encounter: Payer: Self-pay | Admitting: Vascular Surgery

## 2013-04-28 VITALS — BP 138/54 | HR 58 | Resp 16 | Ht 68.5 in | Wt 196.0 lb

## 2013-04-28 DIAGNOSIS — I6529 Occlusion and stenosis of unspecified carotid artery: Secondary | ICD-10-CM

## 2013-04-28 DIAGNOSIS — M79609 Pain in unspecified limb: Secondary | ICD-10-CM

## 2013-04-28 DIAGNOSIS — I739 Peripheral vascular disease, unspecified: Secondary | ICD-10-CM

## 2013-04-28 NOTE — Telephone Encounter (Signed)
Dr Hart Rochester needs to talk to dod today re this pt

## 2013-04-28 NOTE — Progress Notes (Signed)
Subjective:     Patient ID: [name], male   DOB: 11/08/1945, 67 y.o.   MRN: [id number]  HPI this 67-year-old male is well known to me having previously undergone right femoral-popliteal bypass graft with Gore-Tex in 2009 as well as a right carotid endarterectomy in the past. Patient now reports with essentially rest pain in the left leg which has been present for several months but slowly worsening. He has no history of nonhealing ulcers infection or gangrene. He is unable to ambulate 50 yards. He wakens at night with discomfort in the left foot pains in the dependent position. He does have a significant history of coronary artery disease having previously undergone coronary artery bypass grafting. He had a non-STEMI a few months ago and is being treated medically for his severe coronary artery disease by Dr. Phil Nahser. His EF is approximately 30-35%. He denies any active chest pain at present time.  Past Medical History  Diagnosis Date  . Hyperlipidemia   . Hypertension   . Depression   . Tobacco abuse   . Duodenal ulcer     AGE 17  . PVD (peripheral vascular disease)     prior extensive endarterectomy of the right external iliac, common femoral bypass, prior R CEA. R iliac disease noted during 03/2013 cath.  . Ischemic leg 2009    with extensive endarterectomy  . Coronary artery disease     a. anterior MI in 1989. b. MI with CABG in 1998. c. Small NSTEMI s/p DES to SVG-OM 02/2012, newly recognized LV dysfunction at that time. d. NSTEMI 03/2013: occlusion of OM branch after touchdown of SVG, likely the culprit, too small for PCI, for med rx.  . Chronic systolic CHF (congestive heart failure)     a. EF 35% by cath 02/2012. b. EF 25-30% by echo 03/2013.  . Anemia     Instructed 02/2012 to f/u with PCP  . Nocturnal oxygen desaturation 02/2012  . COPD (chronic obstructive pulmonary disease)     a. Suspected during admit 03/2013.  . Atrial fibrillation     a. Transient during 03/2013 admission,  started on apixaban/amiodarone.  . Myocardial infarction   . Cancer 2011    BCC-face    History  Substance Use Topics  . Smoking status: Current Every Day Smoker -- 0.50 packs/day for 50 years    Types: Cigarettes  . Smokeless tobacco: Never Used     Comment: stated that he uses e-cig (03/30/13)  . Alcohol Use: No    Family History  Problem Relation Age of Onset  . Lung disease Mother   . Osteoporosis Mother   . Hypertension Mother   . Deep vein thrombosis Mother   . Lung cancer Father   . Cancer Father   . Heart disease Father   . Hyperlipidemia Father   . Hypertension Father   . Heart attack Father   . ALS Brother   . Heart disease Brother     Heart Disease before age 60 and  AAA  . Hyperlipidemia Brother   . Hypertension Brother   . Heart attack Brother   . Hypertension Sister     No Known Allergies  Current outpatient prescriptions:albuterol (PROVENTIL HFA;VENTOLIN HFA) 108 (90 BASE) MCG/ACT inhaler, Inhale 2 puffs into the lungs every 6 (six) hours as needed for wheezing or shortness of breath., Disp: 1 Inhaler, Rfl: 1;  amiodarone (PACERONE) 200 MG tablet, AS OF 04/08/13 YOU WILL DECREASE TO 200 MG DAILY, Disp: 30 tablet, Rfl:   0;  amLODipine (NORVASC) 5 MG tablet, Take 1 tablet (5 mg total) by mouth daily., Disp: 90 tablet, Rfl: 3 apixaban (ELIQUIS) 5 MG TABS tablet, Take 1 tablet (5 mg total) by mouth 2 (two) times daily., Disp: 60 tablet, Rfl: 3;  atorvastatin (LIPITOR) 80 MG tablet, Take 1 tablet (80 mg total) by mouth every evening., Disp: 90 tablet, Rfl: 3;  carvedilol (COREG) 12.5 MG tablet, Take 1 tablet (12.5 mg total) by mouth 2 (two) times daily with a meal., Disp: 60 tablet, Rfl: 0 clopidogrel (PLAVIX) 75 MG tablet, TAKE 1 TABLET BY MOUTH DAILY WITH BREAKFAST, Disp: 90 tablet, Rfl: 0;  enalapril (VASOTEC) 10 MG tablet, Take 1 tablet (10 mg total) by mouth 2 (two) times daily., Disp: 180 tablet, Rfl: 3;  furosemide (LASIX) 40 MG tablet, Take 1 tablet (40 mg total)  by mouth daily., Disp: 30 tablet, Rfl: 0;  isosorbide mononitrate (IMDUR) 30 MG 24 hr tablet, Take 1 tablet (30 mg total) by mouth daily., Disp: 30 tablet, Rfl: 0 loratadine (CLARITIN) 10 MG tablet, Take 10 mg by mouth every evening., Disp: , Rfl: ;  nitroGLYCERIN (NITROSTAT) 0.4 MG SL tablet, Place 1 tablet (0.4 mg total) under the tongue every 5 (five) minutes as needed for chest pain., Disp: 25 tablet, Rfl: prn;  spironolactone (ALDACTONE) 25 MG tablet, Take 0.5 tablets (12.5 mg total) by mouth 2 (two) times daily., Disp: 30 tablet, Rfl: 6  BP 138/54  Pulse 58  Resp 16  Ht 5' 8.5" (1.74 m)  Wt 196 lb (88.905 kg)  BMI 29.36 kg/m2  SpO2 98%  Body mass index is 29.36 kg/(m^2).           Review of Systems denies chest pain but does have dyspnea on exertion and productive cough and wheezing and numbness in the feet. Also history of major depression, and skin rashes. All systems other systems negative and complete review of systems     Objective:   Physical Exam blood pressure 1:30 at 4 heart rate 58 respirations 16 Gen.-alert and oriented x3 in no apparent distress HEENT normal for age Lungs no rhonchi or wheezing Cardiovascular regular rhythm no murmurs carotid pulses 3+ palpable no bruits audible Abdomen soft nontender no palpable masses Musculoskeletal free of  major deformities Skin clear -no rashes Neurologic normal Lower extremities 1-2+ right femoral pulse with absent distal pulses 1+ left femoral pulse with absent distal pulses Left foot with dependent rubor but no infection or ulceration noted  Data ordered ABIs and lower extremity duplex scan which are reviewed and interpreted. ABI on the right is 0.68 down from 0.98 previously. On the left the ABI is 0.14 down from 0.47 previously duplex scan reveals possible severe stenosis in the distal common iliac artery on the right and possible severe left external iliac occlusive disease with a patent left common iliac artery  stent. The left SFA is occluded.      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 and severe claudication with known left iliac occlusive disease and previous left common iliac artery stent as well as left superficial femoral artery occlusion Right femoral popliteal Gore-Tex bypass patent with possible patency in jeopardy with recent decrease in ABI and occlusive disease adjacent to bypass with significant right iliac occlusive disease Significant coronary artery disease being treated medically Chronic atrial fibrillation-to be started on Eliquos  but not started yet    Plan:     Was scheduled aortobifemoral angiography with possible   left iliac stenting and right iliac stenting Dr. Brabham on Tuesday, August 5 in preparation for left femoral-popliteal bypass grafting with saphenous vein on Thursday, August 14 by Dr. Lawson if indicated And also checking with cardiology regarding patient's cardiac risk given his severe disease which is being treated medically      

## 2013-04-28 NOTE — Telephone Encounter (Signed)
Dr Shirlee Latch spoke with Dr Hart Rochester. Dr Hart Rochester is requesting surgical clearance from pt's primary cardiologist  for fem-pop to be done in August.

## 2013-04-30 ENCOUNTER — Telehealth: Payer: Self-pay | Admitting: Pharmacist

## 2013-04-30 NOTE — Telephone Encounter (Signed)
Pt called this morning to report he had still not started his Eliquis.  His co-pay is $45 at the pharmacy and he cannot afford that.  Explained we will need to change him to Coumadin. He saw [facility] on Tuesday and is scheduled for a procedures on 8/5 and 8/14.  Pt states Dr. Hart Rochester said he might just need to stay off anticoagulation until after procedures.  Told pt this would just put him at a higher risk of stroke and would need to let Dr. Elease Hashimoto make that decision.  Agree Coumadin would be hard to start and stop that much in the next 4 weeks but procedures could be done fairly easily with Eliquis since only need to hold for 24-48 hours prior to procedure.  Jodette had placed 30-day free card for pt at front desk.  He never picked this up and states he can't get to office this week.  Faxed card to pharmacy.  They were able to use this information and get free 30 day supply.  LM for pt.  Hopefully he will pick up Rx and start now.  Has follow up with Dr. Elease Hashimoto next week to discuss further.

## 2013-05-01 ENCOUNTER — Encounter (HOSPITAL_COMMUNITY): Payer: Self-pay | Admitting: Pharmacy Technician

## 2013-05-03 NOTE — Telephone Encounter (Signed)
Agree with the plans and attempts to start him on anticoagulation.

## 2013-05-05 NOTE — Telephone Encounter (Signed)
[name] is at moderate - high risk for CV complications with vascular surgery.  He had a NSTEMI in 03/2013 and was not able to be revascularized.  He has moderate - severe LV dysfunction with EF of 30-35%.  Fortunately, he is not having any angina.  He continues to smoke.

## 2013-05-06 ENCOUNTER — Encounter: Payer: Self-pay | Admitting: Internal Medicine

## 2013-05-06 ENCOUNTER — Ambulatory Visit (INDEPENDENT_AMBULATORY_CARE_PROVIDER_SITE_OTHER): Payer: Medicare Other | Admitting: Internal Medicine

## 2013-05-06 VITALS — BP 144/72 | HR 63 | Temp 97.7°F | Wt 196.0 lb

## 2013-05-06 DIAGNOSIS — I10 Essential (primary) hypertension: Secondary | ICD-10-CM

## 2013-05-06 DIAGNOSIS — I739 Peripheral vascular disease, unspecified: Secondary | ICD-10-CM

## 2013-05-06 DIAGNOSIS — E785 Hyperlipidemia, unspecified: Secondary | ICD-10-CM

## 2013-05-06 DIAGNOSIS — Z23 Encounter for immunization: Secondary | ICD-10-CM

## 2013-05-06 NOTE — Assessment & Plan Note (Signed)
Consume a low sodium diet Work on exercise Will have cardiology draw lipid panel

## 2013-05-06 NOTE — Assessment & Plan Note (Signed)
Slightly elevated today He will discuss this with cardiology tommorow I will see if they can draw a CMET for me Consume a low sodium diet, work on exercise, and encouraged smoking cessation

## 2013-05-06 NOTE — Patient Instructions (Signed)
Health Maintenance, Males A healthy lifestyle and preventative care can promote health and wellness.  Maintain regular health, dental, and eye exams.  Eat a healthy diet. Foods like vegetables, fruits, whole grains, low-fat dairy products, and lean protein foods contain the nutrients you need without too many calories. Decrease your intake of foods high in solid fats, added sugars, and salt. Get information about a proper diet from your caregiver, if necessary.  Regular physical exercise is one of the most important things you can do for your health. Most adults should get at least 150 minutes of moderate-intensity exercise (any activity that increases your heart rate and causes you to sweat) each week. In addition, most adults need muscle-strengthening exercises on 2 or more days a week.   Maintain a healthy weight. The body mass index (BMI) is a screening tool to identify possible weight problems. It provides an estimate of body fat based on height and weight. Your caregiver can help determine your BMI, and can help you achieve or maintain a healthy weight. For adults 20 years and older:  A BMI below 18.5 is considered underweight.  A BMI of 18.5 to 24.9 is normal.  A BMI of 25 to 29.9 is considered overweight.  A BMI of 30 and above is considered obese.  Maintain normal blood lipids and cholesterol by exercising and minimizing your intake of saturated fat. Eat a balanced diet with plenty of fruits and vegetables. Blood tests for lipids and cholesterol should begin at age 20 and be repeated every 5 years. If your lipid or cholesterol levels are high, you are over 50, or you are a high risk for heart disease, you may need your cholesterol levels checked more frequently.Ongoing high lipid and cholesterol levels should be treated with medicines, if diet and exercise are not effective.  If you smoke, find out from your caregiver how to quit. If you do not use tobacco, do not start.  If you  choose to drink alcohol, do not exceed 2 drinks per day. One drink is considered to be 12 ounces (355 mL) of beer, 5 ounces (148 mL) of wine, or 1.5 ounces (44 mL) of liquor.  Avoid use of street drugs. Do not share needles with anyone. Ask for help if you need support or instructions about stopping the use of drugs.  High blood pressure causes heart disease and increases the risk of stroke. Blood pressure should be checked at least every 1 to 2 years. Ongoing high blood pressure should be treated with medicines if weight loss and exercise are not effective.  If you are 45 to 69 years old, ask your caregiver if you should take aspirin to prevent heart disease.  Diabetes screening involves taking a blood sample to check your fasting blood sugar level. This should be done once every 3 years, after age 45, if you are within normal weight and without risk factors for diabetes. Testing should be considered at a younger age or be carried out more frequently if you are overweight and have at least 1 risk factor for diabetes.  Colorectal cancer can be detected and often prevented. Most routine colorectal cancer screening begins at the age of 50 and continues through age 75. However, your caregiver may recommend screening at an earlier age if you have risk factors for colon cancer. On a yearly basis, your caregiver may provide home test kits to check for hidden blood in the stool. Use of a small camera at the end of a tube,   to directly examine the colon (sigmoidoscopy or colonoscopy), can detect the earliest forms of colorectal cancer. Talk to your caregiver about this at age 50, when routine screening begins. Direct examination of the colon should be repeated every 5 to 10 years through age 75, unless early forms of pre-cancerous polyps or small growths are found.  Hepatitis C blood testing is recommended for all people born from 1945 through 1965 and any individual with known risks for hepatitis C.  Healthy  men should no longer receive prostate-specific antigen (PSA) blood tests as part of routine cancer screening. Consult with your caregiver about prostate cancer screening.  Testicular cancer screening is not recommended for adolescents or adult males who have no symptoms. Screening includes self-exam, caregiver exam, and other screening tests. Consult with your caregiver about any symptoms you have or any concerns you have about testicular cancer.  Practice safe sex. Use condoms and avoid high-risk sexual practices to reduce the spread of sexually transmitted infections (STIs).  Use sunscreen with a sun protection factor (SPF) of 30 or greater. Apply sunscreen liberally and repeatedly throughout the day. You should seek shade when your shadow is shorter than you. Protect yourself by wearing long sleeves, pants, a wide-brimmed hat, and sunglasses year round, whenever you are outdoors.  Notify your caregiver of new moles or changes in moles, especially if there is a change in shape or color. Also notify your caregiver if a mole is larger than the size of a pencil eraser.  A one-time screening for abdominal aortic aneurysm (AAA) and surgical repair of large AAAs by sound wave imaging (ultrasonography) is recommended for ages 65 to 75 years who are current or former smokers.  Stay current with your immunizations. Document Released: 03/22/2008 Document Revised: 12/17/2011 Document Reviewed: 02/19/2011 [employer] Patient Information 2014 [employer].  

## 2013-05-06 NOTE — Assessment & Plan Note (Signed)
Awaiting surgery with Dr. Hart Rochester Encouraged smoking cessation with pt

## 2013-05-06 NOTE — Progress Notes (Signed)
HPI  Pt presents to the clinic today to establish care. He does not have a PCP. He is followed with [facility] for his heart issues. He has no concerns today.  Flu: yearly 07/2012 Tetanus: more than 10 year ago Pneumovax: never Zostovax: never Eye doctor: as needed Dentist: no-dentures Colonoscopy: never Past Medical History  Diagnosis Date  . Hyperlipidemia   . Hypertension   . Depression   . Tobacco abuse   . Duodenal ulcer     AGE 67  . PVD (peripheral vascular disease)     prior extensive endarterectomy of the right external iliac, common femoral bypass, prior R CEA. R iliac disease noted during 03/2013 cath.  . Ischemic leg 2009    with extensive endarterectomy  . Coronary artery disease     a. anterior MI in 1989. b. MI with CABG in 1998. c. Small NSTEMI s/p DES to SVG-OM 02/2012, newly recognized LV dysfunction at that time. d. NSTEMI 03/2013: occlusion of OM branch after touchdown of SVG, likely the culprit, too small for PCI, for med rx.  . Chronic systolic CHF (congestive heart failure)     a. EF 35% by cath 02/2012. b. EF 25-30% by echo 03/2013.  Marland Kitchen Anemia     Instructed 02/2012 to f/u with PCP  . Nocturnal oxygen desaturation 02/2012  . COPD (chronic obstructive pulmonary disease)     a. Suspected during admit 03/2013.  Marland Kitchen Atrial fibrillation     a. Transient during 03/2013 admission, started on apixaban/amiodarone.  . Myocardial infarction   . Cancer 2011    [facility]-face    Current Outpatient Prescriptions  Medication Sig Dispense Refill  . albuterol (PROVENTIL HFA;VENTOLIN HFA) 108 (90 BASE) MCG/ACT inhaler Inhale 2 puffs into the lungs every 6 (six) hours as needed for wheezing or shortness of breath.  1 Inhaler  1  . amiodarone (PACERONE) 200 MG tablet Take 200 mg by mouth daily. AS OF 04/10/13 YOU WILL DECREASE TO 200 MG DAILY      . amLODipine (NORVASC) 5 MG tablet Take 1 tablet (5 mg total) by mouth daily.  90 tablet  3  . apixaban (ELIQUIS) 5 MG TABS tablet Take  1 tablet (5 mg total) by mouth 2 (two) times daily.  60 tablet  3  . Aspirin-Salicylamide-Caffeine (BC HEADACHE POWDER PO) Take 1 packet by mouth daily as needed (for headache).      Marland Kitchen atorvastatin (LIPITOR) 80 MG tablet Take 1 tablet (80 mg total) by mouth every evening.  90 tablet  3  . carvedilol (COREG) 12.5 MG tablet Take 1 tablet (12.5 mg total) by mouth 2 (two) times daily with a meal.  60 tablet  0  . clopidogrel (PLAVIX) 75 MG tablet Take 75 mg by mouth daily with breakfast.      . enalapril (VASOTEC) 10 MG tablet Take 1 tablet (10 mg total) by mouth 2 (two) times daily.  180 tablet  3  . furosemide (LASIX) 40 MG tablet Take 1 tablet (40 mg total) by mouth daily.  30 tablet  0  . isosorbide mononitrate (IMDUR) 30 MG 24 hr tablet Take 1 tablet (30 mg total) by mouth daily.  30 tablet  0  . loratadine (CLARITIN) 10 MG tablet Take 10 mg by mouth every evening.      . nitroGLYCERIN (NITROSTAT) 0.4 MG SL tablet Place 1 tablet (0.4 mg total) under the tongue every 5 (five) minutes as needed for chest pain.  25 tablet  prn  .  spironolactone (ALDACTONE) 25 MG tablet Take 0.5 tablets (12.5 mg total) by mouth 2 (two) times daily.  30 tablet  6   No current facility-administered medications for this visit.    No Known Allergies  Family History  Problem Relation Age of Onset  . Lung disease Mother   . Osteoporosis Mother   . Hypertension Mother   . Deep vein thrombosis Mother   . Lung cancer Father   . Cancer Father   . Heart disease Father   . Hyperlipidemia Father   . Hypertension Father   . Heart attack Father   . ALS Brother   . Heart disease Brother     Heart Disease before age 21 and  AAA  . Hyperlipidemia Brother   . Hypertension Brother   . Heart attack Brother   . Hypertension Sister     History   Social History  . Marital Status: Divorced    Spouse Name: N/A    Number of Children: N/A  . Years of Education: N/A   Occupational History  . Not on file.   Social  History Main Topics  . Smoking status: Current Every Day Smoker -- 0.50 packs/day for 50 years    Types: Cigarettes  . Smokeless tobacco: Never Used     Comment: stated that he uses e-cig (03/30/13)  . Alcohol Use: No  . Drug Use: No  . Sexually Active: No   Other Topics Concern  . Not on file   Social History Narrative  . No narrative on file    ROS:  Constitutional: Denies fever, malaise, fatigue, headache or abrupt weight changes.  HEENT: Pt reports decreased hearing in right ear. Denies eye pain, eye redness, ear pain, ringing in the ears, wax buildup, runny nose, nasal congestion, bloody nose, or sore throat. Respiratory: Denies difficulty breathing, shortness of breath, cough or sputum production.   Cardiovascular: Pt reports palpitaitons. Denies chest pain, chest tightness, or swelling in the hands or feet.  Gastrointestinal: Denies abdominal pain, bloating, constipation, diarrhea or blood in the stool.  GU: Denies frequency, urgency, pain with urination, blood in urine, odor or discharge. Musculoskeletal: Denies decrease in range of motion, difficulty with gait, muscle pain or joint pain and swelling.  Skin: Pt reports eczema on hands/feet. Denies redness, lesions or ulcercations.  Neurological: Denies dizziness, difficulty with memory, difficulty with speech or problems with balance and coordination.   No other specific complaints in a complete review of systems (except as listed in HPI above).  PE:  BP 144/72  Pulse 63  Temp(Src) 97.7 F (36.5 C) (Oral)  Wt 196 lb (88.905 kg)  BMI 29.36 kg/m2  SpO2 94% Wt Readings from Last 3 Encounters:  05/06/13 196 lb (88.905 kg)  04/28/13 196 lb (88.905 kg)  03/30/13 201 lb 12.8 oz (91.536 kg)     General: Appears his stated age, overweight but well developed, well nourished in NAD. HEENT: Head: normal shape and size; Eyes: sclera white, no icterus, conjunctiva pink, PERRLA and EOMs intact; Ears: Tm's gray and intact,  normal light reflex; Nose: mucosa pink and moist, septum midline; Throat/Mouth: Teeth present, mucosa pink and moist, no lesions or ulcerations noted.  Neck: Normal range of motion. Neck supple, trachea midline. No massses, lumps or thyromegaly present.  Cardiovascular: Normal rate and rhythm. S1,S2 noted.  Murmur noted. No rubs or gallops noted. No JVD or BLE edema. No carotid bruits noted. Diminished BLE pulses, radial pulses 2 + Pulmonary/Chest: Normal effort with diminished vesicular  breath sounds. No respiratory distress. No wheezes, rales or ronchi noted.  Abdomen: Soft and nontender. Normal bowel sounds, no bruits noted. No distention or masses noted. Liver, spleen and kidneys non palpable. Musculoskeletal: Normal range of motion. No signs of joint swelling. No difficulty with gait.  Neurological: Alert and oriented. Cranial nerves II-XII intact. Coordination normal. +DTRs bilaterally. Psychiatric: Mood and affect normal. Behavior is normal. Judgment and thought content normal.   BMET    Component Value Date/Time   NA 133* 04/23/2013 0918   K 4.2 04/23/2013 0918   CL 98 04/23/2013 0918   CO2 27 04/23/2013 0918   GLUCOSE 127* 04/23/2013 0918   BUN 20 04/23/2013 0918   CREATININE 1.5 04/23/2013 0918   CALCIUM 9.3 04/23/2013 0918   GFRNONAA 55* 03/26/2013 0625   GFRAA 64* 03/26/2013 0625    Lipid Panel     Component Value Date/Time   CHOL 147 03/22/2013 0144   TRIG 178* 03/22/2013 0144   HDL 24* 03/22/2013 0144   CHOLHDL 6.1 03/22/2013 0144   VLDL 36 03/22/2013 0144   LDLCALC 87 03/22/2013 0144    CBC    Component Value Date/Time   WBC 10.3 03/26/2013 0625   RBC 3.77* 03/26/2013 0625   HGB 11.8* 03/26/2013 0625   HCT 34.4* 03/26/2013 0625   PLT 163 03/26/2013 0625   MCV 91.2 03/26/2013 0625   MCH 31.3 03/26/2013 0625   MCHC 34.3 03/26/2013 0625   RDW 13.6 03/26/2013 0625   LYMPHSABS 1.6 03/21/2013 1833   MONOABS 1.0 03/21/2013 1833   EOSABS 0.4 03/21/2013 1833   BASOSABS 0.0 03/21/2013 1833     Hgb A1C Lab Results  Component Value Date   HGBA1C 7.2* 03/21/2013     Assessment and Plan:  Health Maintenance:  Reviewed PMH, FH, SH, cardiology notes, xrays, scans and EKG's. Date to be reviewed at least 20 minutes. Will give tdap and pneumovax today Pt declines screening colonoscopy Will have cardiology draw basic labs including screening PSA

## 2013-05-07 ENCOUNTER — Encounter: Payer: Self-pay | Admitting: Cardiovascular Disease

## 2013-05-07 ENCOUNTER — Ambulatory Visit (INDEPENDENT_AMBULATORY_CARE_PROVIDER_SITE_OTHER): Payer: Medicare Other | Admitting: Cardiovascular Disease

## 2013-05-07 ENCOUNTER — Ambulatory Visit: Payer: Medicare Other | Admitting: Cardiovascular Disease

## 2013-05-07 ENCOUNTER — Other Ambulatory Visit: Payer: Medicare Other

## 2013-05-07 VITALS — BP 140/78 | HR 59 | Ht 68.0 in | Wt 196.0 lb

## 2013-05-07 DIAGNOSIS — I5022 Chronic systolic (congestive) heart failure: Secondary | ICD-10-CM

## 2013-05-07 DIAGNOSIS — I509 Heart failure, unspecified: Secondary | ICD-10-CM

## 2013-05-07 DIAGNOSIS — I739 Peripheral vascular disease, unspecified: Secondary | ICD-10-CM

## 2013-05-07 DIAGNOSIS — I25810 Atherosclerosis of coronary artery bypass graft(s) without angina pectoris: Secondary | ICD-10-CM

## 2013-05-07 DIAGNOSIS — I2510 Atherosclerotic heart disease of native coronary artery without angina pectoris: Secondary | ICD-10-CM

## 2013-05-07 DIAGNOSIS — Z125 Encounter for screening for malignant neoplasm of prostate: Secondary | ICD-10-CM

## 2013-05-07 DIAGNOSIS — I5023 Acute on chronic systolic (congestive) heart failure: Secondary | ICD-10-CM

## 2013-05-07 LAB — COMPREHENSIVE METABOLIC PANEL
Albumin: 4 g/dL (ref 3.5–5.2)
Alkaline Phosphatase: 48 U/L (ref 39–117)
BUN: 22 mg/dL (ref 6–23)
CO2: 27 mEq/L (ref 19–32)
Calcium: 9.7 mg/dL (ref 8.4–10.5)
Chloride: 98 mEq/L (ref 96–112)
Glucose, Bld: 112 mg/dL — ABNORMAL HIGH (ref 70–99)
Potassium: 4.7 mEq/L (ref 3.5–5.1)
Sodium: 133 mEq/L — ABNORMAL LOW (ref 135–145)
Total Protein: 6.9 g/dL (ref 6.0–8.3)

## 2013-05-07 LAB — LIPID PANEL
Cholesterol: 121 mg/dL (ref 0–200)
Total CHOL/HDL Ratio: 4
Triglycerides: 132 mg/dL (ref 0.0–149.0)

## 2013-05-07 LAB — CBC
HCT: 40 % (ref 39.0–52.0)
RBC: 4.21 Mil/uL — ABNORMAL LOW (ref 4.22–5.81)
RDW: 13.9 % (ref 11.5–14.6)
WBC: 10.6 10*3/uL — ABNORMAL HIGH (ref 4.5–10.5)

## 2013-05-07 NOTE — Progress Notes (Signed)
[name] Date of Birth  10/02/1946 [facility] Office  [address]    [address]   [address] [place], [place]  [address]    [place], [place]  [address] [phone]  Fax  [phone]  [phone]  Fax [phone]  Problem list: 1. Coronary artery disease-status post CABG in 1998-complicated by mediastinitis, s/p PCI of OM in May, 2013. 2. Hypertension 3. Peripheral vascular disease 4. Atrial Fibrillation 5. Chronic systolic CHF ( Ischemic cardiomyopathy)    History of Present Illness:  [name] is a 67 year old gentleman with a long history of coronary artery disease.  He's not exercising on a regular basis.  He still smokes between one half and one pack of cigarettes a day.  He's not had any episodes of chest pain or shortness of breath. He has claudication in both legs when he walks. He denies any chest pain or shortness of breath.  Sept. 19, 2013 -  He has done well.  He had PCI in May.  He has had occasional episodes of CP - relieved with 1 NTG. He is not getting any regular exercise.  He is limited by his PAD / claudication of his left leg.  He sees Dr. Hart Rochester for his PVD.   May 07, 2013:  [name] was hospitalized in June, 2014.  He was seen by Tereso Newcomer following that hospitalization.   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 He was admitted 6/14-6/19 after presenting to the hospital with chest pain. Cardiac markers were abnormal. He had prolonged chest pain and underwent urgent cardiac catheterization. LHC 03/21/13: SVG-RCA occluded, SVG-acute marginal of the RCA proximal 80% (not amenable to PCI), SVG-OM patent, LIMA-LAD patent. Patient did have an occlusion of the antegrade limb of the obtuse marginal distal to the graft anastomosis. This vessel was small and not felt to be a reasonable target for PCI.  DES in the SVG-OM was patent. He was noted to have severe disease in the right iliac which had previously been stented. Medical therapy was recommended. He developed volume overload post procedure and he was placed on Lasix. Metoprolol was changed to carvedilol. Echo 03/23/13: EF 25-30%, mild LAE. Patient also developed trial fibrillation with RVR. He converted on IV amiodarone. He was also started on Apixaban. Spironolactone was added. He did have some hypoxia with ambulation. Oxygen saturation remained above 89%. It was suspected that he does have COPD. Smoking cessation was recommended. He was d/c on proventil prn.  Cath note ( Dr. Garnette Scheuermann) ANGIOGRAPHIC DATA:  The left main coronary artery is 56% distal obstruction. Heavily calcified and diffusely diseased. No significant change compared to one year ago to.  The left anterior descending artery is totally occluded.  The left circumflex artery is severely and diffusely diseased with 90% obstruction in the first marginal/ramus branch, 99% in the proximal circumflex, and total occlusion after the second marginal.  The right coronary artery is totally occluded.  BYPASS GRAFT ANGIOGRAPHY:  Saphenous vein graft to the right coronary: Totally occluded  Saphenous vein graft to the acute marginal of the right coronary: Patent,.diffusely diseased, small in caliber, and 80% proximal stenosis. This vessel is not a reasonable candidate for PCI  Saphenous vein graft to the obtuse marginal: The stent in the ostium of the saphenous vein  graft is widely patent. irregularities are noted in the mid graft. The graft then anastomosis into the obtuse marginal which has lost the antegrade limb beyond the anastomosis. The second marginal branch fills retrogradely from the saphenous vein graft.  IMA graft to LAD: Widely patent  LEFT VENTRICULOGRAM: Not performed   IMPRESSIONS: 1. Non-ST elevation myocardial infarction related to occlusion of the antegrade limb of the  obtuse marginal distal to the graft anastomosis. This vessel is small and was not felt to be a reasonable target to intervene upon because of the ostial saphenous vein graft stent and the small caliber of the vessel. Furthermore the patient's pain had completely resolved.  2. Total occlusion of the LAD, native right coronary, and mid circumflex. Greater than 50% native left main disease.  3. Patent DES stent in the ostium of the saphenous vein graft to the marginal, occlusion of the saphenous vein graft to the right coronary,.diffuse disease in the saphenous and graft to the acute arsenal of the right coronary.  4. Widely patent LIMA to the LAD with diffusely diseased LAD beyond the graft and insertion.  5. Severe disease in the right iliac which has been previously stented. Absent left femoral pulse.  6. Left ventriculography not performed  RECOMMENDATION: IV heparin, IV nitroglycerin, resumption of medications which the patient has not taken today, close observation of the patient's right lower extremity given the obvious disease in the iliac that was traversed with catheters during this procedure. I had detailed discussion with the patient prior to procedure about the risk of limb loss/ischemia, and he consented to the procedure citing the most recent catheterization performed one year ago from the right femoral approach   He is feeling better.   He still smokes several cigarettes a day.  He has been using an e-cigarette but his is currently broken.      Current Outpatient Prescriptions on File Prior to Visit  Medication Sig Dispense Refill  . albuterol (PROVENTIL HFA;VENTOLIN HFA) 108 (90 BASE) MCG/ACT inhaler Inhale 2 puffs into the lungs every 6 (six) hours as needed for wheezing or shortness of breath.  1 Inhaler  1  . amiodarone (PACERONE) 200 MG tablet Take 200 mg by mouth daily. AS OF 04/10/13 YOU WILL DECREASE TO 200 MG DAILY      . amLODipine (NORVASC) 5 MG tablet Take 1 tablet (5 mg total)  by mouth daily.  90 tablet  3  . apixaban (ELIQUIS) 5 MG TABS tablet Take 1 tablet (5 mg total) by mouth 2 (two) times daily.  60 tablet  3  . Aspirin-Salicylamide-Caffeine (BC HEADACHE POWDER PO) Take 1 packet by mouth daily as needed (for headache).      Marland Kitchen atorvastatin (LIPITOR) 80 MG tablet Take 1 tablet (80 mg total) by mouth every evening.  90 tablet  3  . carvedilol (COREG) 12.5 MG tablet Take 1 tablet (12.5 mg total) by mouth 2 (two) times daily with a meal.  60 tablet  0  . clopidogrel (PLAVIX) 75 MG tablet Take 75 mg by mouth daily with breakfast.      . enalapril (VASOTEC) 10 MG tablet Take 1 tablet (10 mg total) by mouth 2 (two) times daily.  180 tablet  3  . furosemide (LASIX) 40 MG tablet Take 1 tablet (40 mg total) by mouth daily.  30 tablet  0  . isosorbide mononitrate (IMDUR) 30 MG 24 hr tablet Take 1 tablet (30 mg total) by mouth daily.  30 tablet  0  .  loratadine (CLARITIN) 10 MG tablet Take 10 mg by mouth every evening.      . nitroGLYCERIN (NITROSTAT) 0.4 MG SL tablet Place 1 tablet (0.4 mg total) under the tongue every 5 (five) minutes as needed for chest pain.  25 tablet  prn  . spironolactone (ALDACTONE) 25 MG tablet Take 0.5 tablets (12.5 mg total) by mouth 2 (two) times daily.  30 tablet  6   No current facility-administered medications on file prior to visit.    No Known Allergies  Past Medical History  Diagnosis Date  . Hyperlipidemia   . Hypertension   . Depression   . Tobacco abuse   . Duodenal ulcer     AGE 31  . PVD (peripheral vascular disease)     prior extensive endarterectomy of the right external iliac, common femoral bypass, prior R CEA. R iliac disease noted during 03/2013 cath.  . Ischemic leg 2009    with extensive endarterectomy  . Coronary artery disease     a. anterior MI in 1989. b. MI with CABG in 1998. c. Small NSTEMI s/p DES to SVG-OM 02/2012, newly recognized LV dysfunction at that time. d. NSTEMI 03/2013: occlusion of OM branch after  touchdown of SVG, likely the culprit, too small for PCI, for med rx.  . Chronic systolic CHF (congestive heart failure)     a. EF 35% by cath 02/2012. b. EF 25-30% by echo 03/2013.  Marland Kitchen Anemia     Instructed 02/2012 to f/u with PCP  . Nocturnal oxygen desaturation 02/2012  . COPD (chronic obstructive pulmonary disease)     a. Suspected during admit 03/2013.  Marland Kitchen Atrial fibrillation     a. Transient during 03/2013 admission, started on apixaban/amiodarone.  . Myocardial infarction   . 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 Cardiac catheterization  05/04/2003`    EF 50-55%; Grafts patent. Managed medically  . Coronary artery bypass graft  1998  . Carotid endarterectomy  06/2008  . Appendectomy    . Cardiovascular stress test  12/14/2008    EF 52%  . R external iliac, common femoral and profunda femoris endarterectomy  2009  . R common femoral to popliteal bypass    . Left heart cath Left 03/21/13  . Hernia repair      History  Smoking status  . Current Every Day Smoker -- 0.50 packs/day for 50 years  . Types: Cigarettes  Smokeless tobacco  . Never Used    Comment: stated that he uses e-cig (03/30/13)    History  Alcohol Use No    Family History  Problem Relation Age of Onset  . Lung disease Mother   . Osteoporosis Mother   . Hypertension Mother   . Deep vein thrombosis Mother   . Lung cancer Father   . Cancer Father   . Heart disease Father   . Hyperlipidemia Father   . Hypertension Father   . Heart attack Father   . ALS Brother   . Heart disease Brother     Heart Disease before age 25 and  AAA  . Hyperlipidemia Brother   . Hypertension Brother   . Heart attack Brother   . Hypertension Sister     Reviw of Systems:  Reviewed in the HPI.  All other systems are negative.  Physical Exam: Blood pressure 140/78, pulse 59, height 5\' 8"  (1.727 m), weight 196 lb (88.905 kg). General: Well developed, well nourished, in  no acute  distress.  Head: Normocephalic, atraumatic, sclera non-icteric, mucus membranes are moist,   Neck: Supple. Carotids are 2 + without bruits. No JVD  Lungs: Clear bilaterally to auscultation.  Heart: regular rate.  normal  S1 S2. No murmurs, gallops or rubs.  Abdomen: Soft, non-tender, non-distended with normal bowel sounds. No hepatomegaly. No rebound/guarding. No masses.  Msk:  Strength and tone are normal  Extremities: No clubbing or cyanosis. No edema.  Distal pedal pulses are 2+ and equal bilaterally.  Neuro: Alert and oriented X 3. Moves all extremities spontaneously.  Psych:  Responds to questions appropriately with a normal affect.  ECG: 01/15/2012-  normal sinus rhythm. He has left ventricular hypertrophy with repolarization changes. There is an old inferior wall myocardial infarction.  The lateral ST/T wave changes are new from his previous EKG.  Assessment / Plan:

## 2013-05-07 NOTE — Patient Instructions (Addendum)
Your physician recommends that you return for a FASTING lipid profile: today, cmet cbc lipid tsh psa   Your physician wants you to follow-up in: 6 months You will receive a reminder letter in the mail two months in advance. If you don't receive a letter, please call our office to schedule the follow-up appointment.

## 2013-05-07 NOTE — Assessment & Plan Note (Addendum)
  Pt has severe CAD - not amenable to PCI.   ANGIOGRAPHIC DATA:  The left main coronary artery is 56% distal obstruction. Heavily calcified and diffusely diseased. No significant change compared to one year ago to.  The left anterior descending artery is totally occluded.  The left circumflex artery is severely and diffusely diseased with 90% obstruction in the first marginal/ramus branch, 99% in the proximal circumflex, and total occlusion after the second marginal.  The right coronary artery is totally occluded.  BYPASS GRAFT ANGIOGRAPHY:  Saphenous vein graft to the right coronary: Totally occluded  Saphenous vein graft to the acute marginal of the right coronary: Patent,.diffusely diseased, small in caliber, and 80% proximal stenosis. This vessel is not a reasonable candidate for PCI  Saphenous vein graft to the obtuse marginal: The stent in the ostium of the saphenous vein graft is widely patent. irregularities are noted in the mid graft. The graft then anastomosis into the obtuse marginal which has lost the antegrade limb beyond the anastomosis. The second marginal branch fills retrogradely from the saphenous vein graft.  IMA graft to LAD: Widely patent  LEFT VENTRICULOGRAM: Not performed   IMPRESSIONS: 1. Non-ST elevation myocardial infarction related to occlusion of the antegrade limb of the obtuse marginal distal to the graft anastomosis. This vessel is small and was not felt to be a reasonable target to intervene upon because of the ostial saphenous vein graft stent and the small caliber of the vessel. Furthermore the patient's pain had completely resolved.  2. Total occlusion of the LAD, native right coronary, and mid circumflex. Greater than 50% native left main disease.  3. Patent DES stent in the ostium of the saphenous vein graft to the marginal, occlusion of the saphenous vein graft to the right coronary,.diffuse disease in the saphenous and graft to the acute arsenal of the right  coronary.  4. Widely patent LIMA to the LAD with diffusely diseased LAD beyond the graft and insertion.  5. Severe disease in the right iliac which has been previously stented. Absent left femoral pulse.  6. Left ventriculography not performed  RECOMMENDATION: IV heparin, IV nitroglycerin, resumption of medications which the patient has not taken today, close observation of the patient's right lower extremity given the obvious disease in the iliac that was traversed with catheters during this procedure. I had detailed discussion with the patient prior to procedure about the risk of limb loss/ischemia, and he consented to the procedure citing the most recent catheterization performed one year ago from the right femoral approach   Will continue with medical therapy.  He is at moderate to high risk for his upcoming vascular surgery.

## 2013-05-07 NOTE — Assessment & Plan Note (Addendum)
He's not having any CHF symptoms at present but he is very limited.  He had a NSTEMI in June.  Cath showed   He is scheduled for vascular surgery .  He is at high risk for his upcoming vascular surgery given his CAD.

## 2013-05-08 ENCOUNTER — Other Ambulatory Visit: Payer: Self-pay | Admitting: *Deleted

## 2013-05-08 ENCOUNTER — Telehealth: Payer: Self-pay | Admitting: *Deleted

## 2013-05-08 MED ORDER — CLOBETASOL PROPIONATE 0.05 % EX FOAM: Freq: Two times a day (BID) | CUTANEOUS | Status: AC

## 2013-05-08 NOTE — Telephone Encounter (Signed)
Pt called in to give information about medication needed.  Clobetasol Propionate Foam .05% 100gram.  Please advise

## 2013-05-08 NOTE — Telephone Encounter (Signed)
Ok to refill 1 bottle, not refills

## 2013-05-08 NOTE — Telephone Encounter (Signed)
rx sent   Pt informed

## 2013-05-11 MED ORDER — DEXTROSE 5 % IV SOLN
1.5000 g | INTRAVENOUS | Status: DC
  Filled 2013-05-11: qty 1.5

## 2013-05-12 ENCOUNTER — Encounter (HOSPITAL_COMMUNITY)
Admission: RE | Disposition: A | Discharge status: Home / Self Care | Payer: Self-pay | Source: Ambulatory Visit | Attending: Surgery

## 2013-05-12 ENCOUNTER — Encounter (HOSPITAL_COMMUNITY): Payer: Self-pay | Admitting: *Deleted

## 2013-05-12 ENCOUNTER — Ambulatory Visit (HOSPITAL_COMMUNITY)
Admission: RE | Admit: 2013-05-12 | Discharge: 2013-05-12 | Disposition: A | Discharge status: Home / Self Care | Payer: Medicare Other | Source: Ambulatory Visit | Attending: Surgery | Admitting: Surgery

## 2013-05-12 ENCOUNTER — Ambulatory Visit (HOSPITAL_COMMUNITY): Payer: Medicare Other

## 2013-05-12 DIAGNOSIS — I70229 Atherosclerosis of native arteries of extremities with rest pain, unspecified extremity: Secondary | ICD-10-CM | POA: Insufficient documentation

## 2013-05-12 DIAGNOSIS — Y831 Surgical operation with implant of artificial internal device as the cause of abnormal reaction of the patient, or of later complication, without mention of misadventure at the time of the procedure: Secondary | ICD-10-CM | POA: Insufficient documentation

## 2013-05-12 DIAGNOSIS — Z8249 Family history of ischemic heart disease and other diseases of the circulatory system: Secondary | ICD-10-CM | POA: Insufficient documentation

## 2013-05-12 DIAGNOSIS — Z7901 Long term (current) use of anticoagulants: Secondary | ICD-10-CM | POA: Insufficient documentation

## 2013-05-12 DIAGNOSIS — I509 Heart failure, unspecified: Secondary | ICD-10-CM | POA: Insufficient documentation

## 2013-05-12 DIAGNOSIS — J449 Chronic obstructive pulmonary disease, unspecified: Secondary | ICD-10-CM | POA: Insufficient documentation

## 2013-05-12 DIAGNOSIS — Z85828 Personal history of other malignant neoplasm of skin: Secondary | ICD-10-CM | POA: Insufficient documentation

## 2013-05-12 DIAGNOSIS — I7092 Chronic total occlusion of artery of the extremities: Secondary | ICD-10-CM | POA: Insufficient documentation

## 2013-05-12 DIAGNOSIS — D649 Anemia, unspecified: Secondary | ICD-10-CM | POA: Insufficient documentation

## 2013-05-12 DIAGNOSIS — T82898A Other specified complication of vascular prosthetic devices, implants and grafts, initial encounter: Secondary | ICD-10-CM | POA: Insufficient documentation

## 2013-05-12 DIAGNOSIS — I252 Old myocardial infarction: Secondary | ICD-10-CM | POA: Insufficient documentation

## 2013-05-12 DIAGNOSIS — I5022 Chronic systolic (congestive) heart failure: Secondary | ICD-10-CM | POA: Insufficient documentation

## 2013-05-12 DIAGNOSIS — Z79899 Other long term (current) drug therapy: Secondary | ICD-10-CM | POA: Insufficient documentation

## 2013-05-12 DIAGNOSIS — I708 Atherosclerosis of other arteries: Secondary | ICD-10-CM | POA: Insufficient documentation

## 2013-05-12 DIAGNOSIS — F17200 Nicotine dependence, unspecified, uncomplicated: Secondary | ICD-10-CM | POA: Insufficient documentation

## 2013-05-12 DIAGNOSIS — Z7902 Long term (current) use of antithrombotics/antiplatelets: Secondary | ICD-10-CM | POA: Insufficient documentation

## 2013-05-12 DIAGNOSIS — E785 Hyperlipidemia, unspecified: Secondary | ICD-10-CM | POA: Insufficient documentation

## 2013-05-12 DIAGNOSIS — Y929 Unspecified place or not applicable: Secondary | ICD-10-CM | POA: Insufficient documentation

## 2013-05-12 DIAGNOSIS — I2510 Atherosclerotic heart disease of native coronary artery without angina pectoris: Secondary | ICD-10-CM | POA: Insufficient documentation

## 2013-05-12 DIAGNOSIS — Z951 Presence of aortocoronary bypass graft: Secondary | ICD-10-CM | POA: Insufficient documentation

## 2013-05-12 DIAGNOSIS — I10 Essential (primary) hypertension: Secondary | ICD-10-CM | POA: Insufficient documentation

## 2013-05-12 DIAGNOSIS — J4489 Other specified chronic obstructive pulmonary disease: Secondary | ICD-10-CM | POA: Insufficient documentation

## 2013-05-12 HISTORY — DX: Adverse effect of unspecified anesthetic, initial encounter: T41.45XA

## 2013-05-12 HISTORY — PX: ABDOMINAL AORTAGRAM: SHX5454

## 2013-05-12 HISTORY — DX: Other complications of anesthesia, initial encounter: T88.59XA

## 2013-05-12 LAB — COMPREHENSIVE METABOLIC PANEL
AST: 16 U/L (ref 0–37)
BUN: 20 mg/dL (ref 6–23)
CO2: 25 mEq/L (ref 19–32)
Chloride: 100 mEq/L (ref 96–112)
Creatinine, Ser: 1.27 mg/dL (ref 0.50–1.35)
GFR calc non Af Amer: 56 mL/min — ABNORMAL LOW (ref >90)
Total Bilirubin: 0.3 mg/dL (ref 0.3–1.2)

## 2013-05-12 LAB — POCT I-STAT, CHEM 8
Calcium, Ion: 1.23 mmol/L (ref 1.13–1.30)
Chloride: 100 mEq/L (ref 96–112)
Creatinine, Ser: 1.3 mg/dL (ref 0.50–1.35)
Glucose, Bld: 136 mg/dL — ABNORMAL HIGH (ref 70–99)
Hemoglobin: 13.3 g/dL (ref 13.0–17.0)
Potassium: 4 mEq/L (ref 3.5–5.1)

## 2013-05-12 LAB — PROTIME-INR: Prothrombin Time: 14.5 seconds (ref 11.6–15.2)

## 2013-05-12 LAB — POCT ACTIVATED CLOTTING TIME
Activated Clotting Time: 283 seconds
Activated Clotting Time: 181 seconds

## 2013-05-12 LAB — CBC
Platelets: 169 10*3/uL (ref 150–400)
RBC: 3.96 MIL/uL — ABNORMAL LOW (ref 4.22–5.81)
WBC: 7.7 10*3/uL (ref 4.0–10.5)

## 2013-05-12 SURGERY — ABDOMINAL AORTAGRAM
Anesthesia: LOCAL | Laterality: Right

## 2013-05-12 MED ORDER — MORPHINE SULFATE 2 MG/ML IJ SOLN
INTRAMUSCULAR | Status: AC
  Filled 2013-05-12: qty 1

## 2013-05-12 MED ORDER — OXYCODONE-ACETAMINOPHEN 5-325 MG PO TABS: 1.0000 | ORAL_TABLET | ORAL | Status: DC | PRN

## 2013-05-12 MED ORDER — SODIUM CHLORIDE 0.9 % IV SOLN: 1.0000 mL/kg/h | INTRAVENOUS | Status: DC

## 2013-05-12 MED ORDER — MORPHINE SULFATE 10 MG/ML IJ SOLN
2.0000 mg | INTRAMUSCULAR | Status: DC | PRN
  Administered 2013-05-12 (×2): 2 mg via INTRAVENOUS

## 2013-05-12 MED ORDER — GUAIFENESIN-DM 100-10 MG/5ML PO SYRP: 15.0000 mL | ORAL_SOLUTION | ORAL | Status: DC | PRN

## 2013-05-12 MED ORDER — HEPARIN (PORCINE) IN NACL 2-0.9 UNIT/ML-% IJ SOLN
INTRAMUSCULAR | Status: AC
  Filled 2013-05-12: qty 500

## 2013-05-12 MED ORDER — HEPARIN SODIUM (PORCINE) 1000 UNIT/ML IJ SOLN
INTRAMUSCULAR | Status: AC
  Filled 2013-05-12: qty 1

## 2013-05-12 MED ORDER — MIDAZOLAM HCL 2 MG/2ML IJ SOLN
INTRAMUSCULAR | Status: AC
  Filled 2013-05-12: qty 2

## 2013-05-12 MED ORDER — LABETALOL HCL 5 MG/ML IV SOLN: 10.0000 mg | INTRAVENOUS | Status: DC | PRN

## 2013-05-12 MED ORDER — LIDOCAINE-EPINEPHRINE 1 %-1:100000 IJ SOLN
INTRAMUSCULAR | Status: AC
  Filled 2013-05-12: qty 1

## 2013-05-12 MED ORDER — HYDRALAZINE HCL 20 MG/ML IJ SOLN: 10.0000 mg | INTRAMUSCULAR | Status: DC | PRN

## 2013-05-12 MED ORDER — ACETAMINOPHEN 325 MG PO TABS: 325.0000 mg | ORAL_TABLET | ORAL | Status: DC | PRN

## 2013-05-12 MED ORDER — METOPROLOL TARTRATE 1 MG/ML IV SOLN: 2.0000 mg | INTRAVENOUS | Status: DC | PRN

## 2013-05-12 MED ORDER — ALUM & MAG HYDROXIDE-SIMETH 200-200-20 MG/5ML PO SUSP: 15.0000 mL | ORAL | Status: DC | PRN

## 2013-05-12 MED ORDER — ACETAMINOPHEN 325 MG RE SUPP: 325.0000 mg | RECTAL | Status: DC | PRN

## 2013-05-12 MED ORDER — SODIUM CHLORIDE 0.9 % IV SOLN
INTRAVENOUS | Status: DC
  Administered 2013-05-12: 07:00:00 via INTRAVENOUS

## 2013-05-12 MED ORDER — LIDOCAINE HCL (PF) 1 % IJ SOLN
INTRAMUSCULAR | Status: AC
  Filled 2013-05-12: qty 30

## 2013-05-12 MED ORDER — FENTANYL CITRATE 0.05 MG/ML IJ SOLN
INTRAMUSCULAR | Status: AC
  Filled 2013-05-12: qty 2

## 2013-05-12 MED ORDER — PHENOL 1.4 % MT LIQD: 1.0000 | OROMUCOSAL | Status: DC | PRN

## 2013-05-12 MED ORDER — ONDANSETRON HCL 4 MG/2ML IJ SOLN: 4.0000 mg | Freq: Four times a day (QID) | INTRAMUSCULAR | Status: DC | PRN

## 2013-05-12 NOTE — Op Note (Signed)
[facility]  Patient name: [name] MRN: [id number] DOB: 17-Feb-1946 Sex: male  05/12/2013 Pre-operative Diagnosis: Left leg rest pain Post-operative diagnosis:  Same Surgeon:  Jorge Ny Procedure Performed:  1.  ultrasound-guided access, right femoral artery  2.  attempted ultrasound access left femoral artery  3.  abdominal aortogram  4.  bilateral lower extremity runoff  5.  angioplasty, right common iliac artery  6.  followup times 1    Indications:  The patient has a history of bilateral iliac stenting in 2004. He has subsequently had a right femoral-popliteal bypass graft. He has developed left leg rest pain. Comes in for further evaluation  Procedure:  The patient was identified in the holding area and taken to room 8.  The patient was then placed supine on the table and prepped and draped in the usual sterile fashion.  A time out was called. I initially tried to gain access into the left common femoral artery. I was able to cannulate the artery with a micropuncture needle under ultrasound guidance, however I was not able to advance the wire to to the calcified plaque. Therefore I elected to access the contralateral side. Ultrasound was used to evaluate the right common femoral artery.  It was patent .  A digital ultrasound image was acquired.  A micropuncture needle was used to access the right common femoral artery under ultrasound guidance.  An 018 wire was advanced without resistance and a micropuncture sheath was placed.  The 018 wire was removed and a benson wire was placed.  The micropuncture sheath was exchanged for a 5 french sheath. I had to use a Glidewire and a 4 [place] straight catheter to gain access into the aorta. A Rosen wire was placed and an omniflush catheter was advanced over the wire to the level of L-1.  An abdominal angiogram was obtained.  Next the catheter was pulled down to the aortic bifurcation and pelvic angiography  was obtained in the left and right anterior oblique positions. This was followed by bilateral lower extremity runoff  Findings:   Aortogram:  No stenosis is identified within the suprarenal abdominal aorta. The right renal artery is not visualized. The left renal artery is patent. The infrarenal abdominal aorta is patent without stenosis but heavily calcified. There is a high-grade stenosis within the stent in the right common iliac artery, approximately 95%. The right external iliac is diffusely diseased without significant focal stenoses. The right hypogastric artery is patent. The stents within the left external and common iliac system are widely patent as is the hypogastric artery.  Right Lower Extremity:  The right common femoral artery is patent throughout its course as is the profunda femoral artery. The superficial femoral artery is occluded. There is a bypass graft with its anastomosis to the common femoral artery. Just beyond the proximal anastomosis there is luminal narrowing which is smooth in caliber. The distal anastomosis is to the below knee popliteal artery. The distal anastomosis is widely patent. Three-vessel runoff is identified however the peroneal artery is diminutive. The anterior tibial and posterior tibial artery a patent across the ankle.  Left Lower Extremity:  The left common femoral artery is heavily calcified and diffusely diseased with occlusion of the proximal profunda and entire superficial femoral artery. The common femoral artery is also occluded distally. There is reconstitution of the calcified below knee popliteal artery. The peroneal artery is the dominant runoff.  Intervention:  After the above images were  acquired, the decision was made to proceed with intervention. Over a 035 Rosen wire, a 7 French sheath was inserted. I elected to perform primary balloon angioplasty of the in-stent stenosis. I initially do this with a 6 x 20 Mustang balloon. This was taken to burst  pressure and held for 1 minute. Followup imaging revealed improved a suboptimal results. Therefore, I elected to up size to an 8 x 20 Mustang balloon. Again this was taken to burst pressure and held up for 30 seconds. Completion imaging revealed significantly improved blood flow through the right common iliac stent. No further intervention was performed. At this point the decision was made to terminate the procedure. Catheters and wires were removed. The patient taken the holding area for sheath pull.  Impression:  #1  nonvisualization of the right renal artery  #2  high-grade, 95% stenosis within the right common iliac stent, successfully dilated using an 8 mm balloon  #3  no significant iliac stenosis on the left  #4  common femoral and superficial femoral artery occlusion on the left with reconstitution of a calcified below knee popliteal artery and single vessel runoff via the peroneal artery  #5  smooth narrowing of the proximal portion of the right femoral-popliteal bypass graft   V. Durene Cal, M.D. [facility] Office: [phone] Pager:  [phone]

## 2013-05-12 NOTE — Interval H&P Note (Signed)
History and Physical Interval Note:  05/12/2013 7:36 AM  [name]  has presented today for surgery, with the diagnosis of PVD  The various methods of treatment have been discussed with the patient and family. After consideration of risks, benefits and other options for treatment, the patient has consented to  Procedure(s): ABDOMINAL AORTAGRAM (N/A) as a surgical intervention .  The patient's history has been reviewed, patient examined, no change in status, stable for surgery.  I have reviewed the patient's chart and labs.  Questions were answered to the patient's satisfaction.     Terron Merfeld IV, V. WELLS

## 2013-05-12 NOTE — Progress Notes (Signed)
Assumed care of pt from Renee Richards, RN.  Report received.  Assessment documented. 

## 2013-05-12 NOTE — H&P (View-Only) (Signed)
Subjective:     Patient ID: [name], male   DOB: 05/16/1946, 67 y.o.   MRN: [id number]  HPI this 67 year old male is well known to me having previously undergone right femoral-popliteal bypass graft with Gore-Tex in 2009 as well as a right carotid endarterectomy in the past. Patient now reports with essentially rest pain in the left leg which has been present for several months but slowly worsening. He has no history of nonhealing ulcers infection or gangrene. He is unable to ambulate 50 yards. He wakens at night with discomfort in the left foot pains in the dependent position. He does have a significant history of coronary artery disease having previously undergone coronary artery bypass grafting. He had a non-STEMI a few months ago and is being treated medically for his severe coronary artery disease by Dr. Delane Ginger. His EF is approximately 30-35%. He denies any active chest pain at present time.  Past Medical History  Diagnosis Date  . Hyperlipidemia   . Hypertension   . Depression   . Tobacco abuse   . Duodenal ulcer     AGE 63  . PVD (peripheral vascular disease)     prior extensive endarterectomy of the right external iliac, common femoral bypass, prior R CEA. R iliac disease noted during 03/2013 cath.  . Ischemic leg 2009    with extensive endarterectomy  . Coronary artery disease     a. anterior MI in 1989. b. MI with CABG in 1998. c. Small NSTEMI s/p DES to SVG-OM 02/2012, newly recognized LV dysfunction at that time. d. NSTEMI 03/2013: occlusion of OM branch after touchdown of SVG, likely the culprit, too small for PCI, for med rx.  . Chronic systolic CHF (congestive heart failure)     a. EF 35% by cath 02/2012. b. EF 25-30% by echo 03/2013.  Marland Kitchen Anemia     Instructed 02/2012 to f/u with PCP  . Nocturnal oxygen desaturation 02/2012  . COPD (chronic obstructive pulmonary disease)     a. Suspected during admit 03/2013.  Marland Kitchen Atrial fibrillation     a. Transient during 03/2013 admission,  started on apixaban/amiodarone.  . Myocardial infarction   . Cancer 2011    BCC-face    History  Substance Use Topics  . Smoking status: Current Every Day Smoker -- 0.50 packs/day for 50 years    Types: Cigarettes  . Smokeless tobacco: Never Used     Comment: stated that he uses e-cig (03/30/13)  . Alcohol Use: No    Family History  Problem Relation Age of Onset  . Lung disease Mother   . Osteoporosis Mother   . Hypertension Mother   . Deep vein thrombosis Mother   . Lung cancer Father   . Cancer Father   . Heart disease Father   . Hyperlipidemia Father   . Hypertension Father   . Heart attack Father   . ALS Brother   . Heart disease Brother     Heart Disease before age 71 and  AAA  . Hyperlipidemia Brother   . Hypertension Brother   . Heart attack Brother   . Hypertension Sister     No Known Allergies  Current outpatient prescriptions:albuterol (PROVENTIL HFA;VENTOLIN HFA) 108 (90 BASE) MCG/ACT inhaler, Inhale 2 puffs into the lungs every 6 (six) hours as needed for wheezing or shortness of breath., Disp: 1 Inhaler, Rfl: 1;  amiodarone (PACERONE) 200 MG tablet, AS OF 04/08/13 YOU WILL DECREASE TO 200 MG DAILY, Disp: 30 tablet, Rfl:  0;  amLODipine (NORVASC) 5 MG tablet, Take 1 tablet (5 mg total) by mouth daily., Disp: 90 tablet, Rfl: 3 apixaban (ELIQUIS) 5 MG TABS tablet, Take 1 tablet (5 mg total) by mouth 2 (two) times daily., Disp: 60 tablet, Rfl: 3;  atorvastatin (LIPITOR) 80 MG tablet, Take 1 tablet (80 mg total) by mouth every evening., Disp: 90 tablet, Rfl: 3;  carvedilol (COREG) 12.5 MG tablet, Take 1 tablet (12.5 mg total) by mouth 2 (two) times daily with a meal., Disp: 60 tablet, Rfl: 0 clopidogrel (PLAVIX) 75 MG tablet, TAKE 1 TABLET BY MOUTH DAILY WITH BREAKFAST, Disp: 90 tablet, Rfl: 0;  enalapril (VASOTEC) 10 MG tablet, Take 1 tablet (10 mg total) by mouth 2 (two) times daily., Disp: 180 tablet, Rfl: 3;  furosemide (LASIX) 40 MG tablet, Take 1 tablet (40 mg total)  by mouth daily., Disp: 30 tablet, Rfl: 0;  isosorbide mononitrate (IMDUR) 30 MG 24 hr tablet, Take 1 tablet (30 mg total) by mouth daily., Disp: 30 tablet, Rfl: 0 loratadine (CLARITIN) 10 MG tablet, Take 10 mg by mouth every evening., Disp: , Rfl: ;  nitroGLYCERIN (NITROSTAT) 0.4 MG SL tablet, Place 1 tablet (0.4 mg total) under the tongue every 5 (five) minutes as needed for chest pain., Disp: 25 tablet, Rfl: prn;  spironolactone (ALDACTONE) 25 MG tablet, Take 0.5 tablets (12.5 mg total) by mouth 2 (two) times daily., Disp: 30 tablet, Rfl: 6  BP 138/54  Pulse 58  Resp 16  Ht 5' 8.5" (1.74 m)  Wt 196 lb (88.905 kg)  BMI 29.36 kg/m2  SpO2 98%  Body mass index is 29.36 kg/(m^2).           Review of Systems denies chest pain but does have dyspnea on exertion and productive cough and wheezing and numbness in the feet. Also history of major depression, and skin rashes. All systems other systems negative and complete review of systems     Objective:   Physical Exam blood pressure 1:30 at 4 heart rate 58 respirations 16 Gen.-alert and oriented x3 in no apparent distress HEENT normal for age Lungs no rhonchi or wheezing Cardiovascular regular rhythm no murmurs carotid pulses 3+ palpable no bruits audible Abdomen soft nontender no palpable masses Musculoskeletal free of  major deformities Skin clear -no rashes Neurologic normal Lower extremities 1-2+ right femoral pulse with absent distal pulses 1+ left femoral pulse with absent distal pulses Left foot with dependent rubor but no infection or ulceration noted  Data ordered ABIs and lower extremity duplex scan which are reviewed and interpreted. ABI on the right is 0.68 down from 0.98 previously. On the left the ABI is 0.14 down from 0.47 previously duplex scan reveals possible severe stenosis in the distal common iliac artery on the right and possible severe left external iliac occlusive disease with a patent left common iliac artery  stent. The left SFA is occluded.      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 and severe claudication with known left iliac occlusive disease and previous left common iliac artery stent as well as left superficial femoral artery occlusion Right femoral popliteal Gore-Tex bypass patent with possible patency in jeopardy with recent decrease in ABI and occlusive disease adjacent to bypass with significant right iliac occlusive disease Significant coronary artery disease being treated medically Chronic atrial fibrillation-to be started on Eliquos  but not started yet    Plan:     Was scheduled aortobifemoral angiography with possible  left iliac stenting and right iliac stenting Dr. Myra Gianotti on Tuesday, August 5 in preparation for left femoral-popliteal bypass grafting with saphenous vein on Thursday, August 14 by Dr. Hart Rochester if indicated And also checking with cardiology regarding patient's cardiac risk given his severe disease which is being treated medically

## 2013-05-12 NOTE — Progress Notes (Signed)
Pt chart left for [place], [place] ( anesthesia) to review cardiac history. Pt states that he had a heart attack in June 2014 and May 2013. Pt is S/P lower extremity angiogram on May 12, 2013.

## 2013-05-12 NOTE — Progress Notes (Signed)
According to [facility] from Dr. Candie Chroman office regarding pt instructions for Eliquis, nurse states that pt will not start Eliquis until after surgery.

## 2013-05-12 NOTE — Pre-Procedure Instructions (Addendum)
[name]  05/12/2013   Your procedure is scheduled on:  Thursday, May 21, 2013  Report to [facility] Stay [facility] at 5:30 AM.  Call this number if you have problems the morning of surgery: [phone]   Remember:   Do not eat food or drink liquids after midnight.   Take these medicines the morning of surgery with A SIP OF WATER:amiodarone (PACERONE) 200 MG tablet, amLODipine (NORVASC) 5 MG tablet, carvedilol (COREG) 12.5 MG tablet ,  isosorbide mononitrate (IMDUR) 30 MG 24 hr tablet,  If needed: albuterol (PROVENTIL HFA;VENTOLIN HFA) 108 (90 BASE) MCG/ACT inhaler for shortness of breath or wheezing ( bring on day of surgery),  nitroGLYCERIN (NITROSTAT) 0.4 MG SL tablet for chest pain if needed  Stop taking Aspirin, and herbal medications. Do not take any NSAIDs ie: Ibuprofen, Advil, Naproxen or any medication containing Aspirin (Aspirin-Salicylamide-Caffeine (BC HEADACHE POWDER PO) Stop taking Plavix after 05/15/13 for surgery  Do not wear jewelry, make-up or nail polish.  Do not wear lotions, powders, or perfumes. You may wear deodorant.  Do not shave 48 hours prior to surgery. Men may shave face and neck.  Do not bring valuables to the hospital.  [facility] is not responsible  for any belongings or valuables.  Contacts, dentures or bridgework may not be worn into surgery.  Leave suitcase in the car. After surgery it may be brought to your room.  For patients admitted to the hospital, checkout time is 11:00 AM the day of discharge.   Patients discharged the day of surgery will not be allowed to drive home.  Name and phone number of your driver:   Special Instructions: Shower using CHG 2 nights before surgery and the night before surgery.  If you shower the day of surgery use CHG.  Use special wash - you have one bottle of CHG for all showers.  You should use approximately 1/3 of the bottle for each shower.   Please read over the following fact sheets that you were given: Pain  Booklet, Coughing and Deep Breathing, Blood Transfusion Information, MRSA Information and Surgical Site Infection Prevention

## 2013-05-13 LAB — PREPARE RBC (CROSSMATCH)

## 2013-05-13 MED ORDER — HYDROMORPHONE HCL PF 1 MG/ML IJ SOLN
INTRAMUSCULAR | Status: AC
  Filled 2013-05-13: qty 1

## 2013-05-13 NOTE — Progress Notes (Signed)
Anesthesia Chart Review:  Patient is a 67 year old male scheduled for left FPBG on 05/21/13 by Dr. Hart Rochester.  History includes CAD with anterior MI s/p CABG '98 complicated by mediastinitis, NSTEMI s/p PCI of OM graft 02/2012, and NSTEMI 03/21/13 related to occlusion of the antegrade limb of the OM distal to the graft anastomosis (not amendable to PCI), ischemic cardiomyopathy, chronic systolic CHF, afib, HTN, PAD s/p bilateral iliac stents '04 with right FPBG with extensive endarterectomy '09 with right CFA angioplasty 05/12/13, s/p right CEA, HLD, duodenal ulcers at age 73, anemia, smoking, COPD, depression, skin cancer (BCC). For anesthesia history, he reports what sounds like post-operative pulmonary edema. Recently established with Nicki Reaper, NP at [facility], who is aware of plans for vascular surgery.  Dr. Elease Hashimoto is his cardiologist.  He is aware of plans for surgery and feels patient is at high risk for his upcoming vascular surgery given his CAD. Of note, he is scheduled to see Dr. Graciela Husbands on 05/26/13 to discuss consideration of ICD in the future.    LHC 03/21/13: SVG-RCA occluded, SVG-acute marginal of the RCA proximal 80% (not amenable to PCI), SVG-OM patent, LIMA-LAD patent. Patient did have an occlusion of the antegrade limb of the obtuse marginal distal to the graft anastomosis. This vessel was small and not felt to be a reasonable target for PCI. DES in the SVG-OM was patent. He was noted to have severe disease in the right iliac which had previously been stented. Medical therapy was recommended.  Echo on 03/23/13 showed:  - Left ventricle: Inferior wall and septum more hypokinetic than the rest. Poor endocardial definition The cavity size was moderately dilated. Wall thickness was normal. Systolic function was severely reduced. The estimated ejection fraction was in the range of 25% to 30%. - Mitral valve: Valve area by pressure half-time: 1.22cm^2. Trivial regurgitation. - Left atrium: The atrium  was mildly dilated. - Atrial septum: No defect or patent foramen ovale was identified. - Pulmonic valve: Trivial regurgitation. - Tricuspid valve: Mild regurgitation.  His last stress test was 02/2012.  He has had two cardiac caths since then (see Notes tab for those reports).  EKG on 03/30/13 showed NSR, LVH with repolarization abnormality (diffuse inferolateral T wave depression).  It was felt unchanged from prior EKG.  Carotid duplex on 04/28/13 showed patent right CEA site, < 40% LIACS.  Preoperative labs noted.  He is for a CXR on the day of surgery as he was on post-angiogram precautions during his PAT interview).  Patient's cardiologist is aware of plans for surgery, but feels patient is high risk.  He is not seeing EP cardiology until after surgery.  There is no documented VT/VF, but with low EF could consider intraoperative cardiac pads as a precaution.  I have reviewed with anesthesiologist Dr. Randa Evens.  Velna Ochs [facility] Stay [facility]/Anesthesiology Phone [phone] 05/13/2013 2:10 PM

## 2013-05-21 ENCOUNTER — Inpatient Hospital Stay (HOSPITAL_COMMUNITY): Payer: Medicare Other

## 2013-05-21 ENCOUNTER — Encounter (HOSPITAL_COMMUNITY): Payer: Self-pay | Admitting: Vascular Surgery

## 2013-05-21 ENCOUNTER — Inpatient Hospital Stay (HOSPITAL_COMMUNITY): Payer: Medicare Other | Admitting: Vascular Surgery

## 2013-05-21 ENCOUNTER — Inpatient Hospital Stay (HOSPITAL_COMMUNITY)
Admission: RE | Admit: 2013-05-21 | Discharge: 2013-05-27 | DRG: 237 | Disposition: A | Discharge status: Home Health | Payer: Medicare Other | Source: Ambulatory Visit | Attending: Vascular Surgery | Admitting: Vascular Surgery

## 2013-05-21 ENCOUNTER — Encounter (HOSPITAL_COMMUNITY): Payer: Self-pay | Admitting: *Deleted

## 2013-05-21 ENCOUNTER — Encounter (HOSPITAL_COMMUNITY)
Admission: RE | Disposition: A | Discharge status: Home Health | Payer: Self-pay | Source: Ambulatory Visit | Attending: Vascular Surgery

## 2013-05-21 DIAGNOSIS — E785 Hyperlipidemia, unspecified: Secondary | ICD-10-CM | POA: Diagnosis present

## 2013-05-21 DIAGNOSIS — Z951 Presence of aortocoronary bypass graft: Secondary | ICD-10-CM

## 2013-05-21 DIAGNOSIS — N189 Chronic kidney disease, unspecified: Secondary | ICD-10-CM | POA: Diagnosis present

## 2013-05-21 DIAGNOSIS — Z85828 Personal history of other malignant neoplasm of skin: Secondary | ICD-10-CM

## 2013-05-21 DIAGNOSIS — I2510 Atherosclerotic heart disease of native coronary artery without angina pectoris: Secondary | ICD-10-CM | POA: Diagnosis present

## 2013-05-21 DIAGNOSIS — I959 Hypotension, unspecified: Secondary | ICD-10-CM | POA: Diagnosis not present

## 2013-05-21 DIAGNOSIS — Z7902 Long term (current) use of antithrombotics/antiplatelets: Secondary | ICD-10-CM

## 2013-05-21 DIAGNOSIS — Z79899 Other long term (current) drug therapy: Secondary | ICD-10-CM

## 2013-05-21 DIAGNOSIS — R339 Retention of urine, unspecified: Secondary | ICD-10-CM | POA: Diagnosis not present

## 2013-05-21 DIAGNOSIS — D62 Acute posthemorrhagic anemia: Secondary | ICD-10-CM | POA: Diagnosis not present

## 2013-05-21 DIAGNOSIS — I252 Old myocardial infarction: Secondary | ICD-10-CM

## 2013-05-21 DIAGNOSIS — I2589 Other forms of chronic ischemic heart disease: Secondary | ICD-10-CM | POA: Diagnosis present

## 2013-05-21 DIAGNOSIS — I129 Hypertensive chronic kidney disease with stage 1 through stage 4 chronic kidney disease, or unspecified chronic kidney disease: Secondary | ICD-10-CM | POA: Diagnosis present

## 2013-05-21 DIAGNOSIS — I4891 Unspecified atrial fibrillation: Secondary | ICD-10-CM | POA: Diagnosis present

## 2013-05-21 DIAGNOSIS — E871 Hypo-osmolality and hyponatremia: Secondary | ICD-10-CM | POA: Diagnosis not present

## 2013-05-21 DIAGNOSIS — I70229 Atherosclerosis of native arteries of extremities with rest pain, unspecified extremity: Principal | ICD-10-CM | POA: Diagnosis present

## 2013-05-21 DIAGNOSIS — J449 Chronic obstructive pulmonary disease, unspecified: Secondary | ICD-10-CM | POA: Diagnosis present

## 2013-05-21 DIAGNOSIS — R7309 Other abnormal glucose: Secondary | ICD-10-CM | POA: Diagnosis not present

## 2013-05-21 DIAGNOSIS — I70219 Atherosclerosis of native arteries of extremities with intermittent claudication, unspecified extremity: Secondary | ICD-10-CM

## 2013-05-21 DIAGNOSIS — I509 Heart failure, unspecified: Secondary | ICD-10-CM | POA: Diagnosis present

## 2013-05-21 DIAGNOSIS — E875 Hyperkalemia: Secondary | ICD-10-CM | POA: Diagnosis not present

## 2013-05-21 DIAGNOSIS — Z9861 Coronary angioplasty status: Secondary | ICD-10-CM

## 2013-05-21 DIAGNOSIS — I743 Embolism and thrombosis of arteries of the lower extremities: Secondary | ICD-10-CM

## 2013-05-21 DIAGNOSIS — I739 Peripheral vascular disease, unspecified: Secondary | ICD-10-CM | POA: Diagnosis present

## 2013-05-21 DIAGNOSIS — N170 Acute kidney failure with tubular necrosis: Secondary | ICD-10-CM | POA: Diagnosis present

## 2013-05-21 DIAGNOSIS — F17200 Nicotine dependence, unspecified, uncomplicated: Secondary | ICD-10-CM | POA: Diagnosis present

## 2013-05-21 DIAGNOSIS — I5022 Chronic systolic (congestive) heart failure: Secondary | ICD-10-CM | POA: Diagnosis present

## 2013-05-21 DIAGNOSIS — J4489 Other specified chronic obstructive pulmonary disease: Secondary | ICD-10-CM | POA: Diagnosis present

## 2013-05-21 HISTORY — PX: ENDARTERECTOMY: SHX5162

## 2013-05-21 LAB — URINALYSIS, ROUTINE W REFLEX MICROSCOPIC
Leukocytes, UA: NEGATIVE
Nitrite: NEGATIVE
Protein, ur: 30 mg/dL — AB
Specific Gravity, Urine: 1.008 (ref 1.005–1.030)
Urobilinogen, UA: 0.2 mg/dL (ref 0.0–1.0)

## 2013-05-21 LAB — URINE MICROSCOPIC-ADD ON

## 2013-05-21 SURGERY — ENDARTERECTOMY, ILIAC
Anesthesia: General | Site: Leg Upper | Laterality: Left | Wound class: Clean

## 2013-05-21 MED ORDER — AMLODIPINE BESYLATE 5 MG PO TABS
5.0000 mg | ORAL_TABLET | Freq: Every day | ORAL | Status: DC
  Administered 2013-05-22 – 2013-05-27 (×6): 5 mg via ORAL
  Filled 2013-05-21 (×7): qty 1

## 2013-05-21 MED ORDER — ACETAMINOPHEN 325 MG PO TABS
325.0000 mg | ORAL_TABLET | ORAL | Status: DC | PRN
  Administered 2013-05-22 – 2013-05-25 (×3): 650 mg via ORAL
  Filled 2013-05-21 (×3): qty 2

## 2013-05-21 MED ORDER — DOCUSATE SODIUM 100 MG PO CAPS
100.0000 mg | ORAL_CAPSULE | Freq: Every day | ORAL | Status: DC
  Administered 2013-05-22 – 2013-05-27 (×6): 100 mg via ORAL
  Filled 2013-05-21 (×6): qty 1

## 2013-05-21 MED ORDER — LABETALOL HCL 5 MG/ML IV SOLN
10.0000 mg | INTRAVENOUS | Status: DC | PRN
  Filled 2013-05-21: qty 4

## 2013-05-21 MED ORDER — CLOBETASOL PROPIONATE 0.05 % EX FOAM: Freq: Two times a day (BID) | CUTANEOUS | Status: DC

## 2013-05-21 MED ORDER — ENALAPRIL MALEATE 10 MG PO TABS
10.0000 mg | ORAL_TABLET | Freq: Two times a day (BID) | ORAL | Status: DC
  Administered 2013-05-21 – 2013-05-25 (×7): 10 mg via ORAL
  Filled 2013-05-21 (×11): qty 1

## 2013-05-21 MED ORDER — CEFUROXIME SODIUM 1.5 G IJ SOLR
1.5000 g | Freq: Two times a day (BID) | INTRAMUSCULAR | Status: AC
  Administered 2013-05-21 – 2013-05-22 (×2): 1.5 g via INTRAVENOUS
  Filled 2013-05-21 (×2): qty 1.5

## 2013-05-21 MED ORDER — POTASSIUM CHLORIDE CRYS ER 20 MEQ PO TBCR: 20.0000 meq | EXTENDED_RELEASE_TABLET | Freq: Once | ORAL | Status: AC | PRN

## 2013-05-21 MED ORDER — PHENYLEPHRINE HCL 10 MG/ML IJ SOLN
10.0000 mg | INTRAVENOUS | Status: DC | PRN
  Administered 2013-05-21: 15 ug/min via INTRAVENOUS

## 2013-05-21 MED ORDER — PROPOFOL 10 MG/ML IV BOLUS
INTRAVENOUS | Status: DC | PRN
  Administered 2013-05-21: 180 mg via INTRAVENOUS

## 2013-05-21 MED ORDER — DOPAMINE-DEXTROSE 3.2-5 MG/ML-% IV SOLN: 3.0000 ug/kg/min | INTRAVENOUS | Status: DC

## 2013-05-21 MED ORDER — PANTOPRAZOLE SODIUM 40 MG PO TBEC
40.0000 mg | DELAYED_RELEASE_TABLET | Freq: Every day | ORAL | Status: DC
  Administered 2013-05-22 – 2013-05-27 (×6): 40 mg via ORAL
  Filled 2013-05-21 (×5): qty 1

## 2013-05-21 MED ORDER — ALUM & MAG HYDROXIDE-SIMETH 200-200-20 MG/5ML PO SUSP
15.0000 mL | ORAL | Status: DC | PRN
  Administered 2013-05-22: 30 mL via ORAL
  Filled 2013-05-21: qty 30

## 2013-05-21 MED ORDER — PHENOL 1.4 % MT LIQD: 1.0000 | OROMUCOSAL | Status: DC | PRN

## 2013-05-21 MED ORDER — HYDRALAZINE HCL 20 MG/ML IJ SOLN: 10.0000 mg | INTRAMUSCULAR | Status: DC | PRN

## 2013-05-21 MED ORDER — AMIODARONE HCL 200 MG PO TABS
200.0000 mg | ORAL_TABLET | Freq: Every day | ORAL | Status: DC
  Administered 2013-05-22 – 2013-05-27 (×6): 200 mg via ORAL
  Filled 2013-05-21 (×7): qty 1

## 2013-05-21 MED ORDER — MORPHINE SULFATE 2 MG/ML IJ SOLN
2.0000 mg | INTRAMUSCULAR | Status: DC | PRN
  Administered 2013-05-21 (×2): 4 mg via INTRAVENOUS
  Administered 2013-05-21 – 2013-05-22 (×3): 2 mg via INTRAVENOUS
  Filled 2013-05-21 (×3): qty 1
  Filled 2013-05-21 (×2): qty 2

## 2013-05-21 MED ORDER — GLYCOPYRROLATE 0.2 MG/ML IJ SOLN
INTRAMUSCULAR | Status: DC | PRN
  Administered 2013-05-21: 0.2 mg via INTRAVENOUS
  Administered 2013-05-21: 0.4 mg via INTRAVENOUS

## 2013-05-21 MED ORDER — ALBUTEROL SULFATE HFA 108 (90 BASE) MCG/ACT IN AERS
2.0000 | INHALATION_SPRAY | Freq: Four times a day (QID) | RESPIRATORY_TRACT | Status: DC | PRN
  Administered 2013-05-22: 2 via RESPIRATORY_TRACT
  Filled 2013-05-21: qty 6.7

## 2013-05-21 MED ORDER — DEXTROSE 5 % IV SOLN
1.5000 g | INTRAVENOUS | Status: DC | PRN
  Administered 2013-05-21: 1.5 g via INTRAVENOUS

## 2013-05-21 MED ORDER — HYDROMORPHONE HCL PF 1 MG/ML IJ SOLN
INTRAMUSCULAR | Status: AC
  Administered 2013-05-21: 0.5 mg via INTRAVENOUS
  Filled 2013-05-21: qty 1

## 2013-05-21 MED ORDER — ALBUMIN HUMAN 5 % IV SOLN
INTRAVENOUS | Status: DC | PRN
  Administered 2013-05-21: 08:00:00 via INTRAVENOUS

## 2013-05-21 MED ORDER — 0.9 % SODIUM CHLORIDE (POUR BTL) OPTIME
TOPICAL | Status: DC | PRN
  Administered 2013-05-21: 2000 mL

## 2013-05-21 MED ORDER — FENTANYL CITRATE 0.05 MG/ML IJ SOLN
INTRAMUSCULAR | Status: DC | PRN
  Administered 2013-05-21: 50 ug via INTRAVENOUS
  Administered 2013-05-21: 150 ug via INTRAVENOUS

## 2013-05-21 MED ORDER — FUROSEMIDE 40 MG PO TABS
40.0000 mg | ORAL_TABLET | Freq: Every day | ORAL | Status: DC
  Administered 2013-05-22 – 2013-05-27 (×6): 40 mg via ORAL
  Filled 2013-05-21 (×7): qty 1

## 2013-05-21 MED ORDER — PROTAMINE SULFATE 10 MG/ML IV SOLN
INTRAVENOUS | Status: DC | PRN
  Administered 2013-05-21: 50 mg via INTRAVENOUS

## 2013-05-21 MED ORDER — METOPROLOL TARTRATE 1 MG/ML IV SOLN: 2.0000 mg | INTRAVENOUS | Status: DC | PRN

## 2013-05-21 MED ORDER — ARTIFICIAL TEARS OP OINT
TOPICAL_OINTMENT | OPHTHALMIC | Status: DC | PRN
  Administered 2013-05-21: 1 via OPHTHALMIC

## 2013-05-21 MED ORDER — ISOSORBIDE MONONITRATE ER 30 MG PO TB24
30.0000 mg | ORAL_TABLET | Freq: Every day | ORAL | Status: DC
  Administered 2013-05-22 – 2013-05-27 (×6): 30 mg via ORAL
  Filled 2013-05-21 (×7): qty 1

## 2013-05-21 MED ORDER — HEPARIN SODIUM (PORCINE) 1000 UNIT/ML IJ SOLN
INTRAMUSCULAR | Status: DC | PRN
  Administered 2013-05-21: 6000 [IU] via INTRAVENOUS

## 2013-05-21 MED ORDER — SODIUM CHLORIDE 0.9 % IV SOLN: 500.0000 mL | Freq: Once | INTRAVENOUS | Status: AC | PRN

## 2013-05-21 MED ORDER — CARVEDILOL 12.5 MG PO TABS
12.5000 mg | ORAL_TABLET | Freq: Two times a day (BID) | ORAL | Status: DC
  Administered 2013-05-22 – 2013-05-27 (×11): 12.5 mg via ORAL
  Filled 2013-05-21 (×15): qty 1

## 2013-05-21 MED ORDER — LACTATED RINGERS IV SOLN
INTRAVENOUS | Status: DC | PRN
  Administered 2013-05-21: 07:00:00 via INTRAVENOUS

## 2013-05-21 MED ORDER — SODIUM CHLORIDE 0.9 % IV SOLN: INTRAVENOUS | Status: DC

## 2013-05-21 MED ORDER — LIDOCAINE HCL (CARDIAC) 20 MG/ML IV SOLN
INTRAVENOUS | Status: DC | PRN
  Administered 2013-05-21: 30 mg via INTRAVENOUS

## 2013-05-21 MED ORDER — SPIRONOLACTONE 12.5 MG HALF TABLET
12.5000 mg | ORAL_TABLET | Freq: Two times a day (BID) | ORAL | Status: DC
  Administered 2013-05-21 – 2013-05-24 (×6): 12.5 mg via ORAL
  Filled 2013-05-21 (×9): qty 1

## 2013-05-21 MED ORDER — ZOLPIDEM TARTRATE 5 MG PO TABS: 5.0000 mg | ORAL_TABLET | Freq: Every evening | ORAL | Status: DC | PRN

## 2013-05-21 MED ORDER — SODIUM CHLORIDE 0.9 % IV SOLN
INTRAVENOUS | Status: DC
  Administered 2013-05-21 – 2013-05-22 (×2): via INTRAVENOUS

## 2013-05-21 MED ORDER — ACETAMINOPHEN 650 MG RE SUPP: 325.0000 mg | RECTAL | Status: DC | PRN

## 2013-05-21 MED ORDER — ONDANSETRON HCL 4 MG/2ML IJ SOLN
INTRAMUSCULAR | Status: DC | PRN
  Administered 2013-05-21: 4 mg via INTRAVENOUS

## 2013-05-21 MED ORDER — ATORVASTATIN CALCIUM 80 MG PO TABS
80.0000 mg | ORAL_TABLET | Freq: Every evening | ORAL | Status: DC
  Administered 2013-05-21 – 2013-05-26 (×6): 80 mg via ORAL
  Filled 2013-05-21 (×7): qty 1

## 2013-05-21 MED ORDER — ONDANSETRON HCL 4 MG/2ML IJ SOLN
4.0000 mg | Freq: Four times a day (QID) | INTRAMUSCULAR | Status: DC | PRN
  Administered 2013-05-21 – 2013-05-25 (×6): 4 mg via INTRAVENOUS
  Filled 2013-05-21 (×6): qty 2

## 2013-05-21 MED ORDER — CLOBETASOL PROPIONATE EMULSION 0.05 % EX FOAM
1.0000 "application " | Freq: Two times a day (BID) | CUTANEOUS | Status: DC
  Administered 2013-05-22: 1 via CUTANEOUS
  Administered 2013-05-23 – 2013-05-25 (×3): via CUTANEOUS
  Administered 2013-05-25 – 2013-05-27 (×3): 1 via CUTANEOUS
  Filled 2013-05-21 (×3): qty 50

## 2013-05-21 MED ORDER — ROCURONIUM BROMIDE 100 MG/10ML IV SOLN
INTRAVENOUS | Status: DC | PRN
  Administered 2013-05-21: 50 mg via INTRAVENOUS

## 2013-05-21 MED ORDER — CLOPIDOGREL BISULFATE 75 MG PO TABS
75.0000 mg | ORAL_TABLET | Freq: Every day | ORAL | Status: DC
  Administered 2013-05-22 – 2013-05-23 (×2): 75 mg via ORAL
  Filled 2013-05-21 (×4): qty 1

## 2013-05-21 MED ORDER — HYDROMORPHONE HCL PF 1 MG/ML IJ SOLN
0.2500 mg | INTRAMUSCULAR | Status: DC | PRN
  Administered 2013-05-21: 0.5 mg via INTRAVENOUS

## 2013-05-21 MED ORDER — SODIUM CHLORIDE 0.9 % IR SOLN
Status: DC | PRN
  Administered 2013-05-21: 08:00:00

## 2013-05-21 MED ORDER — GUAIFENESIN-DM 100-10 MG/5ML PO SYRP: 15.0000 mL | ORAL_SOLUTION | ORAL | Status: DC | PRN

## 2013-05-21 MED ORDER — LORATADINE 10 MG PO TABS
10.0000 mg | ORAL_TABLET | Freq: Every evening | ORAL | Status: DC
  Administered 2013-05-21 – 2013-05-26 (×6): 10 mg via ORAL
  Filled 2013-05-21 (×7): qty 1

## 2013-05-21 MED ORDER — NEOSTIGMINE METHYLSULFATE 1 MG/ML IJ SOLN
INTRAMUSCULAR | Status: DC | PRN
  Administered 2013-05-21: 3 mg via INTRAVENOUS

## 2013-05-21 MED ORDER — ONDANSETRON HCL 4 MG/2ML IJ SOLN: 4.0000 mg | Freq: Once | INTRAMUSCULAR | Status: DC | PRN

## 2013-05-21 MED ORDER — MORPHINE SULFATE 2 MG/ML IJ SOLN
INTRAMUSCULAR | Status: AC
  Administered 2013-05-21: 2 mg via INTRAVENOUS
  Filled 2013-05-21: qty 1

## 2013-05-21 MED ORDER — OXYCODONE HCL 5 MG PO TABS
5.0000 mg | ORAL_TABLET | ORAL | Status: DC | PRN
  Administered 2013-05-21 (×2): 5 mg via ORAL
  Administered 2013-05-22 – 2013-05-23 (×4): 10 mg via ORAL
  Administered 2013-05-23 – 2013-05-24 (×2): 5 mg via ORAL
  Administered 2013-05-24 – 2013-05-27 (×4): 10 mg via ORAL
  Filled 2013-05-21: qty 1
  Filled 2013-05-21 (×4): qty 2
  Filled 2013-05-21: qty 1
  Filled 2013-05-21 (×6): qty 2

## 2013-05-21 MED ORDER — NITROGLYCERIN 0.4 MG SL SUBL: 0.4000 mg | SUBLINGUAL_TABLET | SUBLINGUAL | Status: DC | PRN

## 2013-05-21 SURGICAL SUPPLY — 57 items
BANDAGE ESMARK 6X9 LF (GAUZE/BANDAGES/DRESSINGS) IMPLANT
BNDG ESMARK 6X9 LF (GAUZE/BANDAGES/DRESSINGS)
BOOT SUTURE AID YELLOW STND (SUTURE) IMPLANT
CANISTER SUCTION 2500CC (MISCELLANEOUS) ×3 IMPLANT
CLIP TI MEDIUM 24 (CLIP) ×6 IMPLANT
CLIP TI WIDE RED SMALL 24 (CLIP) ×3 IMPLANT
CLOTH BEACON ORANGE TIMEOUT ST (SAFETY) ×3 IMPLANT
CONT SPECI 4OZ STER CLIK (MISCELLANEOUS) ×3 IMPLANT
COVER SURGICAL LIGHT HANDLE (MISCELLANEOUS) ×3 IMPLANT
DECANTER SPIKE VIAL GLASS SM (MISCELLANEOUS) IMPLANT
DERMABOND ADVANCED (GAUZE/BANDAGES/DRESSINGS) ×1
DERMABOND ADVANCED .7 DNX12 (GAUZE/BANDAGES/DRESSINGS) ×2 IMPLANT
DRAIN SNY 10X20 3/4 PERF (WOUND CARE) IMPLANT
DRAPE WARM FLUID 44X44 (DRAPE) ×3 IMPLANT
DRAPE X-RAY CASS 24X20 (DRAPES) IMPLANT
ELECT REM PT RETURN 9FT ADLT (ELECTROSURGICAL) ×3
ELECTRODE REM PT RTRN 9FT ADLT (ELECTROSURGICAL) ×2 IMPLANT
EVACUATOR SILICONE 100CC (DRAIN) IMPLANT
GLOVE BIO SURGEON STRL SZ 6.5 (GLOVE) ×6 IMPLANT
GLOVE BIOGEL PI IND STRL 6.5 (GLOVE) ×6 IMPLANT
GLOVE BIOGEL PI IND STRL 7.0 (GLOVE) ×2 IMPLANT
GLOVE BIOGEL PI INDICATOR 6.5 (GLOVE) ×3
GLOVE BIOGEL PI INDICATOR 7.0 (GLOVE) ×1
GLOVE ECLIPSE 6.5 STRL STRAW (GLOVE) ×12 IMPLANT
GLOVE SS BIOGEL STRL SZ 7 (GLOVE) ×2 IMPLANT
GLOVE SUPERSENSE BIOGEL SZ 7 (GLOVE) ×1
GOWN PREVENTION PLUS XXLARGE (GOWN DISPOSABLE) ×3 IMPLANT
GOWN STRL NON-REIN LRG LVL3 (GOWN DISPOSABLE) ×12 IMPLANT
GRAFT HEMASHIELD 8MM (Vascular Products) ×1 IMPLANT
GRAFT VASC STRG 30X8KNIT (Vascular Products) ×2 IMPLANT
INSERT FOGARTY SM (MISCELLANEOUS) ×3 IMPLANT
KIT BASIN OR (CUSTOM PROCEDURE TRAY) ×3 IMPLANT
KIT ROOM TURNOVER OR (KITS) ×3 IMPLANT
NS IRRIG 1000ML POUR BTL (IV SOLUTION) ×6 IMPLANT
PACK PERIPHERAL VASCULAR (CUSTOM PROCEDURE TRAY) ×3 IMPLANT
PAD ARMBOARD 7.5X6 YLW CONV (MISCELLANEOUS) ×6 IMPLANT
PADDING CAST COTTON 6X4 STRL (CAST SUPPLIES) IMPLANT
SET COLLECT BLD 21X3/4 12 (NEEDLE) IMPLANT
STOPCOCK 4 WAY LG BORE MALE ST (IV SETS) IMPLANT
SUT PROLENE 5 0 CC1 (SUTURE) ×6 IMPLANT
SUT PROLENE 6 0 BV (SUTURE) IMPLANT
SUT PROLENE 6 0 C 1 24 (SUTURE) ×3 IMPLANT
SUT PROLENE 6 0 CC (SUTURE) ×6 IMPLANT
SUT PROLENE 7 0 BV 1 (SUTURE) IMPLANT
SUT PROLENE 7 0 BV1 MDA (SUTURE) IMPLANT
SUT SILK 2 0 SH (SUTURE) ×3 IMPLANT
SUT SILK 3 0 (SUTURE)
SUT SILK 3-0 18XBRD TIE 12 (SUTURE) IMPLANT
SUT VIC AB 2-0 CTX 36 (SUTURE) ×6 IMPLANT
SUT VIC AB 3-0 SH 27 (SUTURE) ×2
SUT VIC AB 3-0 SH 27X BRD (SUTURE) ×4 IMPLANT
TOWEL OR 17X24 6PK STRL BLUE (TOWEL DISPOSABLE) ×6 IMPLANT
TOWEL OR 17X26 10 PK STRL BLUE (TOWEL DISPOSABLE) ×6 IMPLANT
TRAY FOLEY CATH 16FRSI W/METER (SET/KITS/TRAYS/PACK) ×3 IMPLANT
TUBING EXTENTION W/L.L. (IV SETS) IMPLANT
UNDERPAD 30X30 INCONTINENT (UNDERPADS AND DIAPERS) ×3 IMPLANT
WATER STERILE IRR 1000ML POUR (IV SOLUTION) ×3 IMPLANT

## 2013-05-21 NOTE — Progress Notes (Signed)
Utilization review completed.  

## 2013-05-21 NOTE — Op Note (Signed)
OPERATIVE REPORT  Date of Surgery: 05/21/2013  Surgeon: Josephina Gip, MD  Assistant: Lorrine Kin Pre-op Diagnosis: Severe left iliac, common femoral, and profunda femoris occlusive disease with occlusion of left superficial femoral artery and rest ischemia left foot  Post-op Diagnosis: Same  Procedure: Procedure(s)  External iliac endarterectomy with external iliac to profunda femorous bypass graft using 8mm Hemashield graft.-Left  Anesthesia: General  EBL: 200 cc  Complications: None  Patient to the operating room placed in supine position at which time satisfactory general endotracheal anesthesia was menstrual period the left leg was prepped with Betadine scrub and solution draped in routine sterile manner. Patient has known total occlusion of the distal left external iliac and common femoral artery and proximal profunda femoris artery as well as left superficial femoral artery with a patent popliteal artery below the knee. He had no inflow. After prepping and draping in routine sterile manner a longitudinal incision was made in the left inguinal region carried down through subcutaneous tissue. Common femoral artery was then dissected free it was totally occluded with severe diffuse calcific plaque and was pulseless. Superficial femoral artery was also occluded and severely diseased and the profunda femoris artery was occluded proximally and it was dissected down past its secondary and tertiary branches until eventually encountered a large branch of the main trunk which was patent and relatively free of disease. This was at least 7 cm distal to its origin. Common femoral artery was exposed well underneath the inguinal ligament the external iliac artery was exposed as high as I could go to the inguinal approach. Eventually I did encounter a pulse in the proximal external iliac artery.. Left branches were ligated with 3-0 silk ties and divided. It was decided at this point because of the  severe nature of the iliac common femoral and profunda disease we would not perform a femoropopliteal it would get inflow into the profunda to see if this relieved the patient's symptoms. Therefore the patient was heparinized external iliac was occluded as high as possible and the inguinal ligament. Profunda branches were occluded distally. It was impossible to make a longitudinal opening made in the common femoral artery because of the diffuse severe calcification. Eventually I found an area in the external iliac packet of an anteriorly spatulated this up anteriorly and transected the vessel and performed an endarterectomy of clamp. There was excellent flow present. The entire common femoral and distal external iliac artery was resected as was the origin of the superficial femoral and the profunda because making an enterotomy was impossible. Eventually I was able to open the profunda denies extended the anterior arthrotomy distally until the vessel encountered was satisfactory for grafting. Transected the profunda distally and performed a localized endarterectomy. There was backbleeding coming from these 2 profunda branches distally. They were relatively free of disease. An 8 mm Hemashield Dacron graft was then obtained spatulated and anastomosed in and the external iliac proximally with 5-0 Prolene. Following this it was spatulated anastomosis and into the distal profunda femoris artery with 6-0 Prolene. Clamps were released there was an excellent pulse and excellent Doppler flow in the profunda branches which was not present previously. Adequate hemostasis was achieved following protamine and the wound closed in layers with Vicryl in a subcuticular fashion patient taken to the recovery room in stable condition  Procedure Details:   Josephina Gip, MD 05/21/2013 10:22 AM

## 2013-05-21 NOTE — H&P (View-Only) (Signed)
Subjective:     Patient ID: [name], male   DOB: 01/11/1946, 67 y.o.   MRN: [id number]  HPI this 67-year-old male is well known to me having previously undergone right femoral-popliteal bypass graft with Gore-Tex in 2009 as well as a right carotid endarterectomy in the past. Patient now reports with essentially rest pain in the left leg which has been present for several months but slowly worsening. He has no history of nonhealing ulcers infection or gangrene. He is unable to ambulate 50 yards. He wakens at night with discomfort in the left foot pains in the dependent position. He does have a significant history of coronary artery disease having previously undergone coronary artery bypass grafting. He had a non-STEMI a few months ago and is being treated medically for his severe coronary artery disease by Dr. Phil Nahser. His EF is approximately 30-35%. He denies any active chest pain at present time.  Past Medical History  Diagnosis Date  . Hyperlipidemia   . Hypertension   . Depression   . Tobacco abuse   . Duodenal ulcer     AGE 17  . PVD (peripheral vascular disease)     prior extensive endarterectomy of the right external iliac, common femoral bypass, prior R CEA. R iliac disease noted during 03/2013 cath.  . Ischemic leg 2009    with extensive endarterectomy  . Coronary artery disease     a. anterior MI in 1989. b. MI with CABG in 1998. c. Small NSTEMI s/p DES to SVG-OM 02/2012, newly recognized LV dysfunction at that time. d. NSTEMI 03/2013: occlusion of OM branch after touchdown of SVG, likely the culprit, too small for PCI, for med rx.  . Chronic systolic CHF (congestive heart failure)     a. EF 35% by cath 02/2012. b. EF 25-30% by echo 03/2013.  . Anemia     Instructed 02/2012 to f/u with PCP  . Nocturnal oxygen desaturation 02/2012  . COPD (chronic obstructive pulmonary disease)     a. Suspected during admit 03/2013.  . Atrial fibrillation     a. Transient during 03/2013 admission,  started on apixaban/amiodarone.  . Myocardial infarction   . Cancer 2011    BCC-face    History  Substance Use Topics  . Smoking status: Current Every Day Smoker -- 0.50 packs/day for 50 years    Types: Cigarettes  . Smokeless tobacco: Never Used     Comment: stated that he uses e-cig (03/30/13)  . Alcohol Use: No    Family History  Problem Relation Age of Onset  . Lung disease Mother   . Osteoporosis Mother   . Hypertension Mother   . Deep vein thrombosis Mother   . Lung cancer Father   . Cancer Father   . Heart disease Father   . Hyperlipidemia Father   . Hypertension Father   . Heart attack Father   . ALS Brother   . Heart disease Brother     Heart Disease before age 60 and  AAA  . Hyperlipidemia Brother   . Hypertension Brother   . Heart attack Brother   . Hypertension Sister     No Known Allergies  Current outpatient prescriptions:albuterol (PROVENTIL HFA;VENTOLIN HFA) 108 (90 BASE) MCG/ACT inhaler, Inhale 2 puffs into the lungs every 6 (six) hours as needed for wheezing or shortness of breath., Disp: 1 Inhaler, Rfl: 1;  amiodarone (PACERONE) 200 MG tablet, AS OF 04/08/13 YOU WILL DECREASE TO 200 MG DAILY, Disp: 30 tablet, Rfl:   0;  amLODipine (NORVASC) 5 MG tablet, Take 1 tablet (5 mg total) by mouth daily., Disp: 90 tablet, Rfl: 3 apixaban (ELIQUIS) 5 MG TABS tablet, Take 1 tablet (5 mg total) by mouth 2 (two) times daily., Disp: 60 tablet, Rfl: 3;  atorvastatin (LIPITOR) 80 MG tablet, Take 1 tablet (80 mg total) by mouth every evening., Disp: 90 tablet, Rfl: 3;  carvedilol (COREG) 12.5 MG tablet, Take 1 tablet (12.5 mg total) by mouth 2 (two) times daily with a meal., Disp: 60 tablet, Rfl: 0 clopidogrel (PLAVIX) 75 MG tablet, TAKE 1 TABLET BY MOUTH DAILY WITH BREAKFAST, Disp: 90 tablet, Rfl: 0;  enalapril (VASOTEC) 10 MG tablet, Take 1 tablet (10 mg total) by mouth 2 (two) times daily., Disp: 180 tablet, Rfl: 3;  furosemide (LASIX) 40 MG tablet, Take 1 tablet (40 mg total)  by mouth daily., Disp: 30 tablet, Rfl: 0;  isosorbide mononitrate (IMDUR) 30 MG 24 hr tablet, Take 1 tablet (30 mg total) by mouth daily., Disp: 30 tablet, Rfl: 0 loratadine (CLARITIN) 10 MG tablet, Take 10 mg by mouth every evening., Disp: , Rfl: ;  nitroGLYCERIN (NITROSTAT) 0.4 MG SL tablet, Place 1 tablet (0.4 mg total) under the tongue every 5 (five) minutes as needed for chest pain., Disp: 25 tablet, Rfl: prn;  spironolactone (ALDACTONE) 25 MG tablet, Take 0.5 tablets (12.5 mg total) by mouth 2 (two) times daily., Disp: 30 tablet, Rfl: 6  BP 138/54  Pulse 58  Resp 16  Ht 5' 8.5" (1.74 m)  Wt 196 lb (88.905 kg)  BMI 29.36 kg/m2  SpO2 98%  Body mass index is 29.36 kg/(m^2).           Review of Systems denies chest pain but does have dyspnea on exertion and productive cough and wheezing and numbness in the feet. Also history of major depression, and skin rashes. All systems other systems negative and complete review of systems     Objective:   Physical Exam blood pressure 1:30 at 4 heart rate 58 respirations 16 Gen.-alert and oriented x3 in no apparent distress HEENT normal for age Lungs no rhonchi or wheezing Cardiovascular regular rhythm no murmurs carotid pulses 3+ palpable no bruits audible Abdomen soft nontender no palpable masses Musculoskeletal free of  major deformities Skin clear -no rashes Neurologic normal Lower extremities 1-2+ right femoral pulse with absent distal pulses 1+ left femoral pulse with absent distal pulses Left foot with dependent rubor but no infection or ulceration noted  Data ordered ABIs and lower extremity duplex scan which are reviewed and interpreted. ABI on the right is 0.68 down from 0.98 previously. On the left the ABI is 0.14 down from 0.47 previously duplex scan reveals possible severe stenosis in the distal common iliac artery on the right and possible severe left external iliac occlusive disease with a patent left common iliac artery  stent. The left SFA is occluded.      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 and severe claudication with known left iliac occlusive disease and previous left common iliac artery stent as well as left superficial femoral artery occlusion Right femoral popliteal Gore-Tex bypass patent with possible patency in jeopardy with recent decrease in ABI and occlusive disease adjacent to bypass with significant right iliac occlusive disease Significant coronary artery disease being treated medically Chronic atrial fibrillation-to be started on Eliquos  but not started yet    Plan:     Was scheduled aortobifemoral angiography with possible   left iliac stenting and right iliac stenting Dr. Brabham on Tuesday, August 5 in preparation for left femoral-popliteal bypass grafting with saphenous vein on Thursday, August 14 by Dr. Maryclare Nydam if indicated And also checking with cardiology regarding patient's cardiac risk given his severe disease which is being treated medically      

## 2013-05-21 NOTE — Anesthesia Preprocedure Evaluation (Addendum)
Anesthesia Evaluation  Patient identified by MRN, date of birth, ID band Patient awake    Reviewed: Allergy & Precautions, H&P , NPO status , Patient's Chart, lab work & pertinent test results, reviewed documented beta blocker date and time   Airway Mallampati: II TM Distance: >3 FB Neck ROM: Full    Dental  (+) Edentulous Lower and Edentulous Upper   Pulmonary COPD COPD inhaler, Current Smoker,  breath sounds clear to auscultation        Cardiovascular hypertension, Pt. on medications and Pt. on home beta blockers + CAD, + Past MI, + CABG, + Peripheral Vascular Disease and +CHF + dysrhythmias Atrial Fibrillation Rhythm:Regular Rate:Normal     Neuro/Psych PSYCHIATRIC DISORDERS Depression    GI/Hepatic PUD,   Endo/Other    Renal/GU      Musculoskeletal   Abdominal   Peds  Hematology   Anesthesia Other Findings   Reproductive/Obstetrics                          Anesthesia Physical Anesthesia Plan  ASA: III  Anesthesia Plan: General   Post-op Pain Management:    Induction: Intravenous  Airway Management Planned: Oral ETT  Additional Equipment:   Intra-op Plan:   Post-operative Plan: Extubation in OR  Informed Consent: I have reviewed the patients History and Physical, chart, labs and discussed the procedure including the risks, benefits and alternatives for the proposed anesthesia with the patient or authorized representative who has indicated his/her understanding and acceptance.   Dental advisory given  Plan Discussed with: CRNA, Anesthesiologist and Surgeon  Anesthesia Plan Comments:         Anesthesia Quick Evaluation

## 2013-05-21 NOTE — OR Nursing (Signed)
Noticed a bluish appearance to penis head when retracting foreskin to place foley. No edema present. Informed Dr. Hart Rochester and Lester Kinsman, PA regarding findings. Foreskin was pulled back over penis head after foley was placed.

## 2013-05-21 NOTE — Transfer of Care (Signed)
Immediate Anesthesia Transfer of Care Note  Patient: [name]  Procedure(s) Performed: Procedure(s): External iliac endarterectomy with external iliac to profunda femorous bypass graft using 8mm Hemashield graft. (Left)  Patient Location: PACU  Anesthesia Type:General  Level of Consciousness: awake, alert  and oriented  Airway & Oxygen Therapy: Patient Spontanous Breathing and Patient connected to face mask oxygen  Post-op Assessment: Report given to PACU RN  Post vital signs: Reviewed and stable  Complications: No apparent anesthesia complications

## 2013-05-21 NOTE — Interval H&P Note (Signed)
History and Physical Interval Note:  05/21/2013 7:10 AM  [name]  has presented today for surgery, with the diagnosis of LEFT AIOD & FPOD  The various methods of treatment have been discussed with the patient and family. After consideration of risks, benefits and other options for treatment, the patient has consented to  Procedure(s): BYPASS GRAFT FEMORAL-POPLITEAL ARTERY (Left) as a surgical intervention .  The patient's history has been reviewed, patient examined, no change in status, stable for surgery.  I have reviewed the patient's chart and labs.  Questions were answered to the patient's satisfaction.     Josephina Gip

## 2013-05-21 NOTE — Anesthesia Postprocedure Evaluation (Signed)
  Anesthesia Post-op Note  Patient: [name]  Procedure(s) Performed: Procedure(s): External iliac endarterectomy with external iliac to profunda femorous bypass graft using 8mm Hemashield graft. (Left)  Patient Location: PACU  Anesthesia Type:General  Level of Consciousness: awake, alert  and oriented  Airway and Oxygen Therapy: Patient Spontanous Breathing and Patient connected to nasal cannula oxygen  Post-op Pain: mild  Post-op Assessment: Post-op Vital signs reviewed, Patient's Cardiovascular Status Stable, Respiratory Function Stable, Patent Airway and Pain level controlled  Post-op Vital Signs: stable  Complications: No apparent anesthesia complications

## 2013-05-21 NOTE — Progress Notes (Signed)
Paged dr Myra Gianotti re pt complaining of spasm pain in RLE calf, able to doppler pulses, vital signs stable, morphine and oxy ir given for pain with some relief. Will continue to monitor

## 2013-05-21 NOTE — Preoperative (Signed)
Beta Blockers   Reason not to administer Beta Blockers:Not Applicable 

## 2013-05-21 NOTE — Anesthesia Postprocedure Evaluation (Signed)
Anesthesia Post Note  Patient: [name]  Procedure(s) Performed: Procedure(s) (LRB): External iliac endarterectomy with external iliac to profunda femorous bypass graft using 8mm Hemashield graft. (Left)  Anesthesia type: General  Patient location: PACU  Post pain: Pain level controlled and Adequate analgesia  Post assessment: Post-op Vital signs reviewed, Patient's Cardiovascular Status Stable, Respiratory Function Stable, Patent Airway and Pain level controlled  Last Vitals:  Filed Vitals:   05/21/13 1125  BP: 116/53  Pulse: 52  Temp:   Resp: 10    Post vital signs: Reviewed and stable  Level of consciousness: awake, alert  and oriented  Complications: No apparent anesthesia complications

## 2013-05-22 ENCOUNTER — Telehealth: Payer: Self-pay | Admitting: Vascular Surgery

## 2013-05-22 ENCOUNTER — Encounter (HOSPITAL_COMMUNITY): Payer: Self-pay | Admitting: Vascular Surgery

## 2013-05-22 DIAGNOSIS — Z48812 Encounter for surgical aftercare following surgery on the circulatory system: Secondary | ICD-10-CM

## 2013-05-22 LAB — BASIC METABOLIC PANEL
CO2: 21 mEq/L (ref 19–32)
Calcium: 8.7 mg/dL (ref 8.4–10.5)
GFR calc Af Amer: 59 mL/min — ABNORMAL LOW (ref >90)
GFR calc non Af Amer: 51 mL/min — ABNORMAL LOW (ref >90)
Sodium: 129 mEq/L — ABNORMAL LOW (ref 135–145)

## 2013-05-22 LAB — CBC
MCH: 31 pg (ref 26.0–34.0)
MCHC: 34.1 g/dL (ref 30.0–36.0)
Platelets: 187 10*3/uL (ref 150–400)
RBC: 3.64 MIL/uL — ABNORMAL LOW (ref 4.22–5.81)
RDW: 13.9 % (ref 11.5–15.5)

## 2013-05-22 MED ORDER — APIXABAN 5 MG PO TABS
5.0000 mg | ORAL_TABLET | Freq: Two times a day (BID) | ORAL | Status: DC
  Administered 2013-05-23 (×2): 5 mg via ORAL
  Filled 2013-05-22 (×4): qty 1

## 2013-05-22 MED ORDER — OXYCODONE HCL 5 MG PO TABS: 5.0000 mg | ORAL_TABLET | Freq: Four times a day (QID) | ORAL | Status: DC | PRN

## 2013-05-22 NOTE — Telephone Encounter (Addendum)
Message copied by Rosalyn Charters on Fri May 22, 2013  9:05 AM ------      Message from: [place], [place] K      Created: Fri May 22, 2013  8:02 AM      Regarding: schedule                   ----- Message -----         From: Dara Lords, PA-C         Sent: 05/22/2013   7:42 AM           To: Sharee Pimple, CMA            S/p External iliac endarterectomy with external iliac to profunda femorous bypass graft using 8mm Hemashield graft.-Left on 05/21/13.            F/u with Dr. Hart Rochester in 2 weeks.            Thanks,      Lelon Mast ------  notified patient of fu appt. on 06-09-13 at 9:45 am with jdl

## 2013-05-22 NOTE — Progress Notes (Signed)
Patient ambulated approximately 163ft outside room to nurse station with rolling walker and nurse standing by. Patient stopped once for rest, no pain or sob, tolerated walk well. Patient educated on the need to ambulate and encouraged to ambulate 3x tomorrow.  Will continue to monitor patient for ability to void, nausea and vomiting and hypo bowel sounds.

## 2013-05-22 NOTE — Evaluation (Signed)
Physical Therapy Evaluation Patient Details Name: [name] MRN: [id number] DOB: May 04, 1946 Today's Date: 05/22/2013 Time: 1341-1410 PT Time Calculation (min): 29 min  PT Assessment / Plan / Recommendation History of Present Illness  pt presents with L Fem Pop and hx of R fem pop, CAD, and CABG.    Clinical Impression  Pt would ebnefit from continued PT to improve overall mobility and maximize independence.  Feel pt will make good progress, however limited by nausea today.      PT Assessment  Patient needs continued PT services    Follow Up Recommendations  Home health PT;Supervision/Assistance - 24 hour    Does the patient have the potential to tolerate intense rehabilitation      Barriers to Discharge        Equipment Recommendations  Rolling walker with 5" wheels    Recommendations for Other Services     Frequency Min 3X/week    Precautions / Restrictions Precautions Precautions: Fall Restrictions Weight Bearing Restrictions: No   Pertinent Vitals/Pain 4/10.  Premedicated.      Mobility  Bed Mobility Bed Mobility: Not assessed Transfers Transfers: Sit to Stand;Stand to Sit Sit to Stand: 4: Min assist;With upper extremity assist;From chair/3-in-1;With armrests Stand to Sit: 4: Min assist;With upper extremity assist;To chair/3-in-1;With armrests Details for Transfer Assistance: cues for UE use and controlling descent to chair.  pt moves slowly.   Ambulation/Gait Ambulation/Gait Assistance: 4: Min guard Ambulation Distance (Feet): 10 Feet Assistive device: Rolling walker Ambulation/Gait Assistance Details: cues for gait sequencing, upright posture.  While ambulating pt began to feel nauseated and vomiting.   Gait Pattern: Step-to pattern;Decreased step length - right;Decreased stance time - left Stairs: No Wheelchair Mobility Wheelchair Mobility: No    Exercises     PT Diagnosis: Difficulty walking;Acute pain  PT Problem List: Decreased  strength;Decreased range of motion;Decreased activity tolerance;Decreased balance;Decreased mobility;Decreased knowledge of use of DME;Pain PT Treatment Interventions: DME instruction;Gait training;Stair training;Functional mobility training;Therapeutic activities;Therapeutic exercise;Balance training;Patient/family education     PT Goals(Current goals can be found in the care plan section) Acute Rehab PT Goals Patient Stated Goal: Home PT Goal Formulation: With patient Time For Goal Achievement: 06/05/13 Potential to Achieve Goals: Good  Visit Information  Last PT Received On: 05/22/13 Assistance Needed: +1 History of Present Illness: pt presents with L Fem Pop and hx of R fem pop, CAD, and CABG.         Prior Functioning  Home Living Family/patient expects to be discharged to:: Private residence Living Arrangements: Spouse/significant other Available Help at Discharge: Family;Available 24 hours/day Type of Home: House Home Access: Stairs to enter [employer] of Steps: 4 Entrance Stairs-Rails: None Home Layout: One level Home Equipment: None Prior Function Level of Independence: Independent Communication Communication: HOH    Cognition  Cognition Arousal/Alertness: Awake/alert Behavior During Therapy: WFL for tasks assessed/performed Overall Cognitive Status: Within Functional Limits for tasks assessed    Extremity/Trunk Assessment Upper Extremity Assessment Upper Extremity Assessment: Defer to OT evaluation Lower Extremity Assessment Lower Extremity Assessment: LLE deficits/detail LLE: Unable to fully assess due to pain   Balance Balance Balance Assessed: Yes Static Standing Balance Static Standing - Balance Support: Bilateral upper extremity supported Static Standing - Level of Assistance: 5: Stand by assistance  End of Session PT - End of Session Equipment Utilized During Treatment: Gait belt Activity Tolerance:  (Limited by nausea) Patient left: in  chair;with call bell/phone within reach;with nursing/sitter in room Nurse Communication: Mobility status  GP  [name], [place] [phone] 05/22/2013, 3:19 PM

## 2013-05-22 NOTE — Progress Notes (Signed)
Patient has not voided since removal of catheter. Patient says his bladder feels slightly full but no urge to void. Bladder scan indicates 700 ml. Straight cath patient per unit protocol  Drained 650 cc. Amber colored urine with strong odor. Procedure tolerated well. Educated patient on the need to try to void if he feels fullness or urge to void. Will continue to monitor.

## 2013-05-22 NOTE — Progress Notes (Signed)
Pt tx to 2 west per MD order, pt VSS, pt verbalized understanding of tx, family at [facility], report called to receiving RN, all questions answered, pt tol well

## 2013-05-22 NOTE — Progress Notes (Addendum)
[facility] Progress Note  05/22/2013 7:23 AM 1 Day Post-Op  Subjective:  States his symptoms have greatly improved.  Now just sore from surgery.  Tm 99.4 now afebrile HR 50's-80's NSR 130's - 150's systolic 92% 2LO2NC  Filed Vitals:   05/22/13 0400  BP: 148/58  Pulse: 79  Temp: 98.6 F (37 C)  Resp: 11    Physical Exam: Incisions:  Left groin incision is c/d/i Extremities:  Left foot is warm with audible doppler signal in the DP/PT  CBC    Component Value Date/Time   WBC 13.5* 05/22/2013 0430   RBC 3.64* 05/22/2013 0430   HGB 11.3* 05/22/2013 0430   HCT 33.1* 05/22/2013 0430   PLT 187 05/22/2013 0430   MCV 90.9 05/22/2013 0430   MCH 31.0 05/22/2013 0430   MCHC 34.1 05/22/2013 0430   RDW 13.9 05/22/2013 0430   LYMPHSABS 1.6 03/21/2013 1833   MONOABS 1.0 03/21/2013 1833   EOSABS 0.4 03/21/2013 1833   BASOSABS 0.0 03/21/2013 1833    BMET    Component Value Date/Time   NA 129* 05/22/2013 0430   K 4.5 05/22/2013 0430   CL 96 05/22/2013 0430   CO2 21 05/22/2013 0430   GLUCOSE 131* 05/22/2013 0430   BUN 25* 05/22/2013 0430   CREATININE 1.37* 05/22/2013 0430   CALCIUM 8.7 05/22/2013 0430   GFRNONAA 51* 05/22/2013 0430   GFRAA 59* 05/22/2013 0430    INR    Component Value Date/Time   INR 1.15 05/12/2013 1556     Intake/Output Summary (Last 24 hours) at 05/22/13 0723 Last data filed at 05/22/13 0600  Gross per 24 hour  Intake 2953.75 ml  Output   3125 ml  Net -171.25 ml     Assessment/Plan:  67 y.o. male is s/p:  External iliac endarterectomy with external iliac to profunda femorous bypass graft using 8mm Hemashield graft.-Left  1 Day Post-Op  -pt doing well this am. -DVT prophylaxis:  Pt is on eliquis and plavix at home-will restart eliquis tomorrow morning (ordered) as pt symptoms have improved.  Will restart this am.  Will hold on lovenox for this reason. 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 No plans for left femoral-popliteal bypass graft at present time we'll gauge this on his symptomatology

## 2013-05-22 NOTE — Progress Notes (Signed)
VASCULAR LAB PRELIMINARY  ARTERIAL  ABI completed:    RIGHT    LEFT    PRESSURE WAVEFORM  PRESSURE WAVEFORM  BRACHIAL 124 Triphasic BRACHIAL 123 Biphasic  DP 95 Triphasic DP  Unable to insonate.  AT   AT    PT 62 Biphasic PT 42 Dampened monophasic  PER   PER    GREAT TOE  NA GREAT TOE  NA    RIGHT LEFT  ABI 0.77 0.34    05/22/2013 3:52 PM Ronell Boldin, RVT, RDCS, RDMS

## 2013-05-23 LAB — TYPE AND SCREEN
ABO/RH(D): O POS
Antibody Screen: NEGATIVE
Unit division: 0

## 2013-05-23 MED ORDER — BISACODYL 10 MG RE SUPP
10.0000 mg | Freq: Once | RECTAL | Status: AC
  Administered 2013-05-23: 10 mg via RECTAL
  Filled 2013-05-23: qty 1

## 2013-05-23 MED ORDER — POLYETHYLENE GLYCOL 3350 17 G PO PACK
17.0000 g | PACK | Freq: Every day | ORAL | Status: DC
  Administered 2013-05-23 – 2013-05-27 (×5): 17 g via ORAL
  Filled 2013-05-23 (×5): qty 1

## 2013-05-23 NOTE — Progress Notes (Signed)
Physical Therapy Treatment Patient Details Name: [name] MRN: [id number] DOB: 06/06/1946 Today's Date: 05/23/2013 Time: 4098-1191 PT Time Calculation (min): 42 min  PT Assessment / Plan / Recommendation  History of Present Illness pt presents with L Fem Pop and hx of R fem pop, CAD, and CABG.     PT Comments   Pt progressing but ambulates very slowly and reported dizziness with stairs today but no presyncope or change to vital signs. Will continue to follow to maximize mobility.   Follow Up Recommendations        Does the patient have the potential to tolerate intense rehabilitation     Barriers to Discharge        Equipment Recommendations       Recommendations for Other Services    Frequency     Progress towards PT Goals Progress towards PT goals: Progressing toward goals  Plan Current plan remains appropriate    Precautions / Restrictions Precautions Precautions: Fall   Pertinent Vitals/Pain 6/10 LLE surgical pain    Mobility  Bed Mobility Bed Mobility: Sit to Supine Sit to Supine: 4: Min assist;HOB flat Details for Bed Mobility Assistance: cueing for sequence with assist to elevate bil LE with cueing for RLE to assist LLE Transfers Sit to Stand: 4: Min guard;From chair/3-in-1 Stand to Sit: 5: Supervision;To chair/3-in-1 Details for Transfer Assistance: cueing for hand placement and safety from chair, WC and stairs Ambulation/Gait Ambulation/Gait Assistance: 4: Min guard Ambulation Distance (Feet): 350 Feet Assistive device: Rolling walker Ambulation/Gait Assistance Details: cueing for posture, position in RW and sequence. Pt walked to stairs and back but on return began feeling fatigued and dizzy and had WC brought to him Gait Pattern: Step-to pattern;Decreased stance time - left Gait velocity: very slow 30' in 45sec Stairs: Yes Stairs Assistance: 4: Min assist Stairs Assistance Details (indicate cue type and reason): cueing for sequence and safety.  At 2nd step pt became dizzy, descended stairs then rested sitting on stairs prior to return ambulation Stair Management Technique: Backwards;With walker Number of Stairs: 2 (pt denied full 4 steps due to dizziness)    Exercises     PT Diagnosis:    PT Problem List:   PT Treatment Interventions:     PT Goals (current goals can now be found in the care plan section)    Visit Information  Last PT Received On: 05/23/13 Assistance Needed: +1 History of Present Illness: pt presents with L Fem Pop and hx of R fem pop, CAD, and CABG.      Subjective Data      Cognition  Cognition Arousal/Alertness: Awake/alert Behavior During Therapy: WFL for tasks assessed/performed Overall Cognitive Status: Within Functional Limits for tasks assessed    Balance     End of Session PT - End of Session Activity Tolerance: Patient limited by fatigue Patient left: in bed;with call bell/phone within reach;with family/visitor present Nurse Communication: Mobility status   GP     Toney Sang Beth 05/23/2013, 12:03 PM Delaney Meigs, PT [phone]

## 2013-05-23 NOTE — Progress Notes (Signed)
Patient voided approximately 50cc since last straight cath. Bladder scanned patient, scan indicates zero mils. Patient has no fullness or urge to urinate. Continue to monitor.

## 2013-05-23 NOTE — Plan of Care (Signed)
Problem: Phase II Progression Outcomes Goal: Tolerating diet Outcome: Not Met (add Reason) Patient has had bouts of nausea and vomiting consequently has declined to eat.

## 2013-05-23 NOTE — Progress Notes (Signed)
Per pt and his wife, pt vomited states it looked like foods he ate at breakfast. Medicated with zofran, cont complaint of no appetite, abd distended, soft passing gas, reports having small bm since laxatives today Egbert Garibaldi A

## 2013-05-23 NOTE — Progress Notes (Addendum)
[facility] [place]  Progress Note Bypass Surgery  Date of Surgery: 05/21/2013  Procedure(s): LEFT External iliac endarterectomy with external iliac to profunda femorous bypass graft using 8mm Hemashield graft. Surgeon: Surgeon(s): Pryor Ochoa, MD  2 Days Post-Op  History of Present Illness  [name] is a 67 y.o. male who is  2 Days Post-Op. The patient's pre-op symptoms of pain in leg are Improved . Patients pain is well controlled.    VASC. LAB Studies:        ABI: Right 0.77;  Left 0.34;  Significant Diagnostic Studies: CBC Lab Results  Component Value Date   WBC 13.5* 05/22/2013   HGB 11.3* 05/22/2013   HCT 33.1* 05/22/2013   MCV 90.9 05/22/2013   PLT 187 05/22/2013    BMET    Component Value Date/Time   NA 129* 05/22/2013 0430   K 4.5 05/22/2013 0430   CL 96 05/22/2013 0430   CO2 21 05/22/2013 0430   GLUCOSE 131* 05/22/2013 0430   BUN 25* 05/22/2013 0430   CREATININE 1.37* 05/22/2013 0430   CALCIUM 8.7 05/22/2013 0430   GFRNONAA 51* 05/22/2013 0430   GFRAA 59* 05/22/2013 0430    COAG Lab Results  Component Value Date   INR 1.15 05/12/2013   INR 0.99 03/21/2013   INR 1.06 02/18/2012   No results found for this basename: PTT    Physical Examination  BP Readings from Last 3 Encounters:  05/23/13 109/60  05/23/13 109/60  05/12/13 137/59   Temp Readings from Last 3 Encounters:  05/23/13 98 F (36.7 C) Oral  05/23/13 98 F (36.7 C) Oral  05/12/13 97.7 F (36.5 C) Oral   SpO2 Readings from Last 3 Encounters:  05/23/13 90%  05/23/13 90%  05/12/13 93%   Pulse Readings from Last 3 Encounters:  05/23/13 64  05/23/13 64  05/12/13 68    Pt is A&O x 3 left lower extremity: Incision/s is/are clean,dry.intact, and  healing without hematoma, erythema or drainage He has some BRB on dressing from skin - minimal Limb is warm; with good color  Assessment/Plan: Pt. Doing well Post-op pain is controlled Wounds are healing well PT/OT  for ambulation Continue wound care as ordered On plavix and eliquis Has min BRB from wound without hematoma and no active bleeding HH stable CRI -stable - watch U/O  ROCZNIAK,REGINA J  05/23/2013 9:48 AM   I have examined the patient, reviewed and agree with above.  EARLY, [name], MD 05/23/2013 10:34 AM

## 2013-05-24 LAB — CBC
HCT: 29.7 % — ABNORMAL LOW (ref 39.0–52.0)
MCH: 32.1 pg (ref 26.0–34.0)
MCV: 90.8 fL (ref 78.0–100.0)
Platelets: 181 10*3/uL (ref 150–400)
RDW: 13.7 % (ref 11.5–15.5)

## 2013-05-24 LAB — BASIC METABOLIC PANEL
CO2: 22 mEq/L (ref 19–32)
Calcium: 8.9 mg/dL (ref 8.4–10.5)
Creatinine, Ser: 2.36 mg/dL — ABNORMAL HIGH (ref 0.50–1.35)
Glucose, Bld: 188 mg/dL — ABNORMAL HIGH (ref 70–99)

## 2013-05-24 MED ORDER — SODIUM CHLORIDE 0.9 % IJ SOLN
3.0000 mL | Freq: Two times a day (BID) | INTRAMUSCULAR | Status: DC
  Administered 2013-05-24 – 2013-05-25 (×3): 3 mL via INTRAVENOUS

## 2013-05-24 MED ORDER — METOCLOPRAMIDE HCL 10 MG/10ML PO SOLN
5.0000 mg | Freq: Three times a day (TID) | ORAL | Status: DC
  Administered 2013-05-24 – 2013-05-27 (×12): 5 mg via ORAL
  Filled 2013-05-24 (×20): qty 5

## 2013-05-24 MED ORDER — CLOPIDOGREL BISULFATE 75 MG PO TABS
75.0000 mg | ORAL_TABLET | Freq: Every day | ORAL | Status: DC
  Administered 2013-05-26 – 2013-05-27 (×2): 75 mg via ORAL
  Filled 2013-05-24 (×3): qty 1

## 2013-05-24 MED ORDER — APIXABAN 5 MG PO TABS
5.0000 mg | ORAL_TABLET | Freq: Two times a day (BID) | ORAL | Status: DC
  Administered 2013-05-26 – 2013-05-27 (×3): 5 mg via ORAL
  Filled 2013-05-24 (×4): qty 1

## 2013-05-24 MED ORDER — SODIUM CHLORIDE 0.9 % IV SOLN
INTRAVENOUS | Status: DC
  Administered 2013-05-24 – 2013-05-26 (×4): via INTRAVENOUS

## 2013-05-24 MED ORDER — TAMSULOSIN HCL 0.4 MG PO CAPS
0.4000 mg | ORAL_CAPSULE | Freq: Every day | ORAL | Status: DC
  Administered 2013-05-24 – 2013-05-27 (×4): 0.4 mg via ORAL
  Filled 2013-05-24 (×4): qty 1

## 2013-05-24 NOTE — Progress Notes (Signed)
Assisted pt with standing to bedside, voided 175 ml, post void bladder scan 100. Will continue to monitor closely [name]

## 2013-05-24 NOTE — Progress Notes (Signed)
Ambulated with patient in hallway, using rolling walker room air sat 88 to 91%, replaced O2 at 2 liters nasal cannula. Will continue to follow closely, reinforce incentive spirometry. Dr. Arbie Cookey made aware of lab results of this am [name]

## 2013-05-24 NOTE — Progress Notes (Addendum)
[facility] [place]  Progress Note Bypass Surgery  Date of Surgery: 05/21/2013  Procedure(s): External iliac endarterectomy with external iliac to profunda femorous bypass graft using 8mm Hemashield graft. Surgeon: Surgeon(s): Pryor Ochoa, MD  3 Days Post-Op  History of Present Illness  [name] is a 67 y.o. male who is  3 Days Post-Op. The patient's pre-op symptoms are Improved . Patients pain is well controlled.  Pt still having nausea and vomiting. Had 2 small BM yesterday. Denies abdominal pain Urinary retention  Still with some ooze from groin  VASC. LAB Studies:        ABI: Right 0.77;  Left 0.34;  Physical Examination  BP Readings from Last 3 Encounters:  05/24/13 102/55  05/24/13 102/55  05/12/13 137/59   Temp Readings from Last 3 Encounters:  05/24/13 98.9 F (37.2 C) Oral  05/24/13 98.9 F (37.2 C) Oral  05/12/13 97.7 F (36.5 C) Oral   SpO2 Readings from Last 3 Encounters:  05/24/13 85%  05/24/13 85%  05/12/13 93%   Pulse Readings from Last 3 Encounters:  05/24/13 60  05/24/13 60  05/12/13 68    Pt is A&O x 3 left upper extremity: Incision/s is/are clean,dry.intact, and  healing without hematoma, erythema or drainage BRB on dressing from skin edge Limb is warm; with good color ABD- Soft NT, NABS  Assessment/Plan: Pt. Doing well Post-op pain is controlled Wounds are healing well PT/OT for ambulation Continue wound care as ordered Urinary retention - beginning to Urinate larger amts - will add flomax Skin edge bleeding - on eliquis and plavix ? DC temporarily N/V post-op - does not seem related to pain meds - ABD soft - NABS, + BM -will try reglan Repeat labs today ? Abd films   [name],[name]  05/24/2013 8:01 AM    I have examined the patient, reviewed and agree with above. Currently eating breakfast without difficulty. Groin stable. He continued to mobilize.  [name], [name], MD 05/24/2013 9:20  AM

## 2013-05-24 NOTE — Progress Notes (Signed)
   CARE MANAGEMENT NOTE 05/24/2013  Patient:  BARNARD, SHARPS   Account Number:  [id number]  Date Initiated:  05/24/2013  Documentation initiated by:  [facility]  Subjective/Objective Assessment:     Action/Plan:   Anticipated DC Date:  05/25/2013   Anticipated DC Plan:  HOME W HOME HEALTH SERVICES      DC Planning Services  CM consult      [facility] Choice  HOME HEALTH   Choice offered to / List presented to:  C-1 Patient   DME arranged  Levan Hurst      DME agency  [facility]     [facility] arranged  HH-1 RN  HH-2 PT      [facility] agency  [facility]   Status of service:  Completed, signed off Medicare Important Message given?   (If response is "NO", the following Medicare IM given date fields will be blank) Date Medicare IM given:   Date Additional Medicare IM given:    Discharge Disposition:  HOME W HOME HEALTH SERVICES  Per UR Regulation:    If discussed at Long Length of Stay Meetings, dates discussed:    Comments:  05/24/2013 1130 NCM spoke to pt and he had [facility] in the past. Requesting their services. Faxed referral to [facility] and contacted [facility] DME rep for RW. Isidoro Donning RN CCM Case Mgmt phone [phone]

## 2013-05-24 NOTE — Progress Notes (Signed)
Pt states he voided once during the nite in commode while having a BM, bladder scan reads 668 this am, assisted pt with standing to void, urinated 200 ml, bladder rescan 225 ml. Regina  PA at bedside, discussed pt concern regarding urinating and nausea [name]

## 2013-05-25 ENCOUNTER — Inpatient Hospital Stay (HOSPITAL_COMMUNITY): Payer: Medicare Other

## 2013-05-25 ENCOUNTER — Encounter (HOSPITAL_COMMUNITY): Payer: Self-pay | Admitting: Internal Medicine

## 2013-05-25 LAB — BASIC METABOLIC PANEL
BUN: 60 mg/dL — ABNORMAL HIGH (ref 6–23)
CO2: 21 mEq/L (ref 19–32)
Calcium: 8.4 mg/dL (ref 8.4–10.5)
Creatinine, Ser: 2.6 mg/dL — ABNORMAL HIGH (ref 0.50–1.35)
GFR calc Af Amer: 27 mL/min — ABNORMAL LOW (ref >90)

## 2013-05-25 LAB — CBC
MCHC: 34.6 g/dL (ref 30.0–36.0)
MCV: 91 fL (ref 78.0–100.0)
Platelets: 159 10*3/uL (ref 150–400)
RDW: 13.9 % (ref 11.5–15.5)
WBC: 10 10*3/uL (ref 4.0–10.5)

## 2013-05-25 NOTE — Progress Notes (Addendum)
[facility]  Progress Note Bypass Surgery  Date of Surgery: 05/21/2013  Procedure(s): LEFT External iliac endarterectomy with external iliac to profunda femorous bypass graft using 8mm Hemashield graft. Surgeon: Surgeon(s): Pryor Ochoa, MD  4 Days Post-Op  History of Present Illness  [name] is a 67 y.o. male who is  4 Days Post-Op. The patient's symptoms of nausea are Improved . Patients pain is well controlled.  No ambulating very much. U/O still low and BUN CR up this am.  Significant Diagnostic Studies: CBC Lab Results  Component Value Date   WBC 10.0 05/25/2013   HGB 9.1* 05/25/2013   HCT 26.3* 05/25/2013   MCV 91.0 05/25/2013   PLT 159 05/25/2013    BMET    Component Value Date/Time   NA 126* 05/25/2013 0450   K 4.4 05/25/2013 0450   CL 95* 05/25/2013 0450   CO2 21 05/25/2013 0450   GLUCOSE 111* 05/25/2013 0450   BUN 60* 05/25/2013 0450   CREATININE 2.60* 05/25/2013 0450   CALCIUM 8.4 05/25/2013 0450   GFRNONAA 24* 05/25/2013 0450   GFRAA 27* 05/25/2013 0450    Physical Examination  BP Readings from Last 3 Encounters:  05/25/13 104/38  05/25/13 104/38  05/12/13 137/59   Temp Readings from Last 3 Encounters:  05/25/13 97.2 F (36.2 C) Oral  05/25/13 97.2 F (36.2 C) Oral  05/12/13 97.7 F (36.5 C) Oral   SpO2 Readings from Last 3 Encounters:  05/25/13 96%  05/25/13 96%  05/12/13 93%   Pulse Readings from Last 3 Encounters:  05/25/13 56  05/25/13 56  05/12/13 68    Pt is A&O x 3 left lower extremity: Incision/s is/are clean,dry.intact, and  healing without hematoma, erythema or drainage Limb is warm; with good color Assessment/Plan: Pt. Doing well Post-op pain is controlled Wounds are healing well PT/OT for ambulation Continue wound care as ordered CRI - BUN /CR up to 60/2.6 Spironolactone held, still on lasix Cont min IVF ? Renal consult Will check dressing for bleeding - off eliquis and plavix yest and  today  ROCZNIAK,REGINA J  05/25/2013 8:03 AM  Left lower extremity doing well with no rest pain Beginning to ambulate with walker Hematocrit 26 and creatinine up to 2.6 with baseline of 1.3 Will get renal consult today DC home when renal status resolved

## 2013-05-25 NOTE — Evaluation (Signed)
Occupational Therapy Evaluation Patient Details Name: [name] MRN: [id number] DOB: 1946/06/21 Today's Date: 05/25/2013 Time: 4098-1191 OT Time Calculation (min): 28 min  OT Assessment / Plan / Recommendation History of present illness pt presents with L Fem Pop and hx of R fem pop, CAD, and CABG.     Clinical Impression   Pt at sup/mi guard A level with ADLs and ADL mobility. Pt lethargic but able to increase attention and arousal with activity OOB. All education completed and no further acute OT services indicated at this time. Pt to continue with acute PT services to address balance and functional mobility safety. OT will sign off    OT Assessment  Patient does not need any further OT services    Follow Up Recommendations  No OT follow up    Barriers to Discharge  None    Equipment Recommendations  None recommended by OT    Recommendations for Other Services    Frequency       Precautions / Restrictions Precautions Precautions: Fall Restrictions Weight Bearing Restrictions: No   Pertinent Vitals/Pain No c/o    ADL  Grooming: Performed;Wash/dry hands;Wash/dry face;Supervision/safety;Min guard Where Assessed - Grooming: Unsupported standing Upper Body Bathing: Simulated;Supervision/safety Lower Body Bathing: Simulated;Supervision/safety;Min guard Upper Body Dressing: Performed;Supervision/safety Lower Body Dressing: Performed;Min guard Toilet Transfer: Performed;Min guard Toilet Transfer Method: Sit to Barista: Regular height toilet;Grab bars Toileting - Clothing Manipulation and Hygiene: Performed;Min guard Where Assessed - Engineer, mining and Hygiene: Standing Tub/Shower Transfer: Performed;Min guard Web designer Method: Science writer: Grab bars;Walk in shower Equipment Used: Gait belt;Rolling walker Transfers/Ambulation Related to ADLs: cues for safety ADL Comments: pt moving slowly     OT Diagnosis:    OT Problem List:   OT Treatment Interventions:     OT Goals(Current goals can be found in the care plan section) Acute Rehab OT Goals Patient Stated Goal: Home  Visit Information  Last OT Received On: 05/25/13 Assistance Needed: +1 History of Present Illness: pt presents with L Fem Pop and hx of R fem pop, CAD, and CABG.         Prior Functioning     Home Living Family/patient expects to be discharged to:: Private residence Living Arrangements: Spouse/significant other Available Help at Discharge: Family;Available 24 hours/day Type of Home: House Home Access: Stairs to enter [employer] of Steps: 4 Entrance Stairs-Rails: None Home Layout: One level Home Equipment: None Prior Function Level of Independence: Independent Communication Communication: HOH Dominant Hand: Right         Vision/Perception Vision - History Baseline Vision: Wears glasses only for reading Patient Visual Report: No change from baseline Perception Perception: Within Functional Limits   Cognition  Cognition Arousal/Alertness: Awake/alert;Lethargic Behavior During Therapy: WFL for tasks assessed/performed Overall Cognitive Status: Within Functional Limits for tasks assessed    Extremity/Trunk Assessment Upper Extremity Assessment Upper Extremity Assessment: Overall WFL for tasks assessed     Mobility Bed Mobility Bed Mobility: Rolling Left;Left Sidelying to Sit Rolling Left: 4: Min guard Left Sidelying to Sit: HOB flat;4: Min guard Sit to Supine: HOB flat;4: Min assist Details for Bed Mobility Assistance: min A with LEs to return to supine Transfers Transfers: Sit to Stand;Stand to Sit Sit to Stand: 4: Min guard;From bed;From toilet Stand to Sit: 4: Min guard;To toilet;To bed Details for Transfer Assistance: cueing for hand placement and safety         Balance Balance Balance Assessed: Yes Dynamic Standing Balance Dynamic Standing -  Balance  Support: No upper extremity supported;During functional activity Dynamic Standing - Level of Assistance: Other (comment) (min guard A)   End of Session OT - End of Session Equipment Utilized During Treatment: Gait belt;Rolling walker Activity Tolerance: Patient tolerated treatment well Patient left: in bed;with call bell/phone within reach  GO     Galen Manila 05/25/2013, 3:19 PM

## 2013-05-25 NOTE — Consult Note (Signed)
I have seen and examined this patient and agree with plan as outlined by Dr. Shirlee Latch.  Pt with an extensive athersclerotic vascular disease history, underwent angiogram on 05/12/13 s/p angioplasty with persistent limb threatening ischemia and required revision of bypass on left.  He did well however his hospitalization has been complicated by AKI.  His baseline Scr appears to be 1.3 but increased 48 hours after surgery and has continued to increase.  Of note he was taken off spironalactone yesterday but was still taking vasotec 10mg  bid and received his most recent dose at 11am.  We were asked to further evaluate his AKI/CKD.  He also reports urinary retention and frequency but denies any h/o CKD.   Assessment: 1. AKI/CKD- likely ischemic ATN given ABLA/relative hypotension in setting of ACE-I.  Also on DDx but less likely is RAS (right renal artery not visualized on angiogram 05/12/13), bladder outlet obstruction, atheroembolic disease.  will order renal US, stop ramipril, follow UOP and daily scr 2. Hyponatremia due to #1- as above.  Check Uosm/Sosm, and UNa tomorrow am prior to next lasix dose 3. PVD- doing well post-operatively 4. ABLA- follow H/H and transfuse prn 5. CHF- stable, hold aldactone and follow but may need to stop lasix as well. Samiah Ricklefs A,MD 05/25/2013 3:59 PM
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
## 2013-05-25 NOTE — Progress Notes (Signed)
Physical Therapy Treatment Patient Details Name: [name] MRN: [id number] DOB: 1946-07-13 Today's Date: 05/25/2013 Time: 4098-1191 PT Time Calculation (min): 42 min  PT Assessment / Plan / Recommendation  History of Present Illness pt presents with L Fem Pop and hx of R fem pop, CAD, and CABG.     PT Comments   Pt progressing with gait with slightly improved gait speed but still very slow. Pt denied stairs today and educated for HEP and bed mobility. Will continue to follow.   Follow Up Recommendations        Does the patient have the potential to tolerate intense rehabilitation     Barriers to Discharge        Equipment Recommendations       Recommendations for Other Services    Frequency     Progress towards PT Goals Progress towards PT goals: Progressing toward goals  Plan Current plan remains appropriate    Precautions / Restrictions Precautions Precautions: Fall   Pertinent Vitals/Pain No pain Pt on RA on arrival due to pt removing O2 with sats 86% but unclear how accurate reading was as it varied 88-98 with 2L throughout gait, HR 78   Mobility  Bed Mobility Bed Mobility: Rolling Left;Left Sidelying to Sit Rolling Left: 4: Min assist Left Sidelying to Sit: 4: Min assist;HOB flat Details for Bed Mobility Assistance: cueing for sequence  Transfers Sit to Stand: 4: Min guard;From bed;With armrests Stand to Sit: 4: Min guard;To chair/3-in-1;With armrests Details for Transfer Assistance: cueing for hand placement and safety  Ambulation/Gait Ambulation/Gait Assistance: 4: Min guard Ambulation Distance (Feet): 400 Feet Assistive device: Rolling walker Ambulation/Gait Assistance Details: cueing for posture, position in RW and to increase step length Gait Pattern: Step-through pattern;Decreased stride length Gait velocity: decreased but improved 20' in 45 sec Stairs: No    Exercises General Exercises - Lower Extremity Long Arc Quad: AROM;15 reps;Left;Seated Hip  Flexion/Marching: AROM;15 reps;Left;Seated Toe Raises: AROM;15 reps;Left;Seated Heel Raises: AROM;Left;15 reps;Seated   PT Diagnosis:    PT Problem List:   PT Treatment Interventions:     PT Goals (current goals can now be found in the care plan section)    Visit Information  Last PT Received On: 05/25/13 Assistance Needed: +1 History of Present Illness: pt presents with L Fem Pop and hx of R fem pop, CAD, and CABG.      Subjective Data      Cognition  Cognition Arousal/Alertness: Awake/alert Behavior During Therapy: WFL for tasks assessed/performed Overall Cognitive Status: Within Functional Limits for tasks assessed    Balance     End of Session PT - End of Session Activity Tolerance: Patient limited by fatigue Patient left: in chair;with call bell/phone within reach;with family/visitor present   GP     Toney Sang [facility] 05/25/2013, 12:17 PM Delaney Meigs, PT [phone]

## 2013-05-26 ENCOUNTER — Ambulatory Visit: Payer: Medicare Other | Admitting: Internal Medicine

## 2013-05-26 LAB — RENAL FUNCTION PANEL
BUN: 61 mg/dL — ABNORMAL HIGH (ref 6–23)
Creatinine, Ser: 2.26 mg/dL — ABNORMAL HIGH (ref 0.50–1.35)
Glucose, Bld: 118 mg/dL — ABNORMAL HIGH (ref 70–99)
Phosphorus: 3.6 mg/dL (ref 2.3–4.6)
Potassium: 4.7 mEq/L (ref 3.5–5.1)

## 2013-05-26 LAB — FOLATE: Folate: 10.2 ng/mL

## 2013-05-26 LAB — RETICULOCYTES
RBC.: 2.83 MIL/uL — ABNORMAL LOW (ref 4.22–5.81)
Retic Count, Absolute: 48.1 10*3/uL (ref 19.0–186.0)

## 2013-05-26 LAB — URINALYSIS, ROUTINE W REFLEX MICROSCOPIC
Bilirubin Urine: NEGATIVE
Hgb urine dipstick: NEGATIVE
Ketones, ur: NEGATIVE mg/dL
Nitrite: NEGATIVE
Protein, ur: 30 mg/dL — AB
Specific Gravity, Urine: 1.013 (ref 1.005–1.030)
Urobilinogen, UA: 0.2 mg/dL (ref 0.0–1.0)

## 2013-05-26 LAB — OSMOLALITY, URINE: Osmolality, Ur: 374 mOsm/kg — ABNORMAL LOW (ref 390–1090)

## 2013-05-26 LAB — CBC
HCT: 25.9 % — ABNORMAL LOW (ref 39.0–52.0)
MCHC: 34.7 g/dL (ref 30.0–36.0)
RDW: 14 % (ref 11.5–15.5)
WBC: 9 10*3/uL (ref 4.0–10.5)

## 2013-05-26 LAB — CREATININE, URINE, RANDOM: Creatinine, Urine: 81.52 mg/dL

## 2013-05-26 LAB — IRON AND TIBC: Iron: 20 ug/dL — ABNORMAL LOW (ref 42–135)

## 2013-05-26 LAB — VITAMIN B12: Vitamin B-12: 464 pg/mL (ref 211–911)

## 2013-05-26 LAB — OSMOLALITY: Osmolality: 285 mOsm/kg (ref 275–300)

## 2013-05-26 LAB — SODIUM, URINE, RANDOM: Sodium, Ur: 15 mEq/L

## 2013-05-26 LAB — URINE MICROSCOPIC-ADD ON

## 2013-05-26 NOTE — Progress Notes (Signed)
Patient ID: [name], male   DOB: 1946/01/28, 67 y.o.   MRN: [id number] Vascular Surgery Progress Note  Subjective: 5 days post left external iliac to profunda femoris bypass for severe ischemia left foot. Patient states left foot pain is gone and he is able to ambulate and increasing amount. States that he did make more urine yesterday. Being evaluated by renal service for chronic renal dysfunction with increased creatinine postoperatively which is now improving  Objective:  Filed Vitals:   05/26/13 0503  BP: 131/47  Pulse: 68  Temp: 98.5 F (36.9 C)  Resp: 18    General alert and oriented x3 Lungs no rhonchi or wheezing Left inguinal incision is generally healing well but there is one dark area about 1-1/2 cm in diameter on the lateral aspect. No evidence of infection. Left foot is adequately perfused with 1+ edema.   Labs:  Recent Labs Lab 05/24/13 0940 05/25/13 0450 05/26/13 0500  CREATININE 2.36* 2.60* 2.26*    Recent Labs Lab 05/24/13 0940 05/25/13 0450 05/26/13 0500  NA 124* 126* 127*  K 4.8 4.4 4.7  CL 91* 95* 95*  CO2 22 21 21   BUN 50* 60* 61*  CREATININE 2.36* 2.60* 2.26*  GLUCOSE 188* 111* 118*  CALCIUM 8.9 8.4 8.2*    Recent Labs Lab 05/24/13 0940 05/25/13 0450 05/26/13 0500  WBC 11.6* 10.0 9.0  HGB 10.5* 9.1* 9.0*  HCT 29.7* 26.3* 25.9*  PLT 181 159 168   No results found for this basename: INR,  in the last 168 hours  I/O last 3 completed shifts: In: 1170 [P.O.:520; I.V.:650] Out: 1160 [Urine:1160]  Imaging: US Renal  05/25/2013   *RADIOLOGY REPORT*  Clinical Data: Acute renal failure.  RENAL/URINARY TRACT ULTRASOUND COMPLETE  Comparison:  None.  Findings:  Right Kidney:  13.6 cm in length.  The renal cortex is quite thin. No hydronephrosis.  2.7 cm cyst in the midportion of the kidney.  Left Kidney:  14.0 cm in length.  3.3 cm cyst on the upper pole. No hydronephrosis.  Bladder:  Normal.  Ureteral jets are not identified.  IMPRESSION:   1.  No evidence of renal obstruction. 2.  The renal cortex of the right kidney is diffusely thinned. 3.  Bilateral renal cysts.   Original Report Authenticated By: Francene Boyers, M.D.    Assessment/Plan:  POD #5   LOS: 5 days  s/p Procedure(s): External iliac endarterectomy with external iliac to profunda femorous bypass graft using 8mm Hemashield graft.  Generally patient is making some improvement in renal function Appreciate renal services assistance with this evaluation When renal fields that status is stable we can discharge home. Hopefully that maybe tomorrow. Discussed with patient and he is agreeable with that plan if renal feels appropriate   Josephina Gip, MD 05/26/2013 8:59 AM

## 2013-05-26 NOTE — Progress Notes (Signed)
I have seen and examined this patient and agree with plan as oultined by Dr. Shirlee Latch.  AKI/CKD related to ischemic ATN (relative hypotension, ABLA in setting of ACE-I).  Hyponatremia and AKI improving after cessation of ACE-I.  Will cont to follow. No changes for now. Dezarae Mcclaran A,MD 05/26/2013 9:31 AM

## 2013-05-26 NOTE — Progress Notes (Signed)
Physical Therapy Treatment Patient Details Name: [name] MRN: [id number] DOB: 20-Nov-1945 Today's Date: 05/26/2013 Time: 4098-1191 PT Time Calculation (min): 39 min  PT Assessment / Plan / Recommendation  History of Present Illness pt presents with L Fem Pop and hx of R fem pop, CAD, and CABG.     PT Comments   Pt progressing with mobility with slowly increasing gait speed and ability to perform stairs today. Pt educated for HEP and continued ambulation with staff. Pt required O2 throughout tx.   Follow Up Recommendations        Does the patient have the potential to tolerate intense rehabilitation     Barriers to Discharge        Equipment Recommendations       Recommendations for Other Services    Frequency     Progress towards PT Goals Progress towards PT goals: Progressing toward goals  Plan Current plan remains appropriate    Precautions / Restrictions Precautions Precautions: Fall   Pertinent Vitals/Pain sats 93% at rest on 2L with drop to 85% on RA after 77ft, return to 2L with sats 92% on 2L dropped to 88% with long hall ambulation and stairs with standing rest to recover to 92% HR 69-74 No pain    Mobility  Bed Mobility Bed Mobility: Not assessed Transfers Sit to Stand: 5: Supervision;From bed;From chair/3-in-1 Stand to Sit: 5: Supervision;To chair/3-in-1;With armrests Details for Transfer Assistance: cueing for hand placement and safety  Ambulation/Gait Ambulation/Gait Assistance: 5: Supervision Ambulation Distance (Feet): 400 Feet Assistive device: Rolling walker Ambulation/Gait Assistance Details: cueing for posture, position in RW Gait Pattern: Step-through pattern;Decreased stride length Gait velocity: decreased Stairs: Yes Stairs Assistance: 4: Min assist Stairs Assistance Details (indicate cue type and reason): cueing for sequence, safety and assist Stair Management Technique: With walker;Backwards;Step to pattern Number of Stairs: 4     Exercises General Exercises - Lower Extremity Long Arc Quad: AROM;Seated;20 reps;Both Hip Flexion/Marching: AROM;Seated;20 reps;Both Toe Raises: AROM;Seated;20 reps;Both Heel Raises: AROM;Seated;20 reps;Both   PT Diagnosis:    PT Problem List:   PT Treatment Interventions:     PT Goals (current goals can now be found in the care plan section)    Visit Information  Last PT Received On: 05/26/13 Assistance Needed: +1 History of Present Illness: pt presents with L Fem Pop and hx of R fem pop, CAD, and CABG.      Subjective Data      Cognition  Cognition Arousal/Alertness: Awake/alert Behavior During Therapy: WFL for tasks assessed/performed Overall Cognitive Status: Within Functional Limits for tasks assessed    Balance     End of Session PT - End of Session Equipment Utilized During Treatment: Gait belt Activity Tolerance: Patient tolerated treatment well Patient left: in chair;with call bell/phone within reach;with family/visitor present Nurse Communication: Mobility status   GP     Delorse Lek 05/26/2013, 12:42 PM Delaney Meigs, PT [phone]

## 2013-05-26 NOTE — Progress Notes (Signed)
[facility] Progress NOTE    Subjective:  Patient is urinating ok w/o difficulty. Denies chest pain, abdominal pain.  No questions   Medications: Outpatient medications: Prescriptions prior to admission  Medication Sig Dispense Refill  . albuterol (PROVENTIL HFA;VENTOLIN HFA) 108 (90 BASE) MCG/ACT inhaler Inhale 2 puffs into the lungs every 6 (six) hours as needed for wheezing or shortness of breath.  1 Inhaler  1  . amiodarone (PACERONE) 200 MG tablet Take 200 mg by mouth daily. AS OF 04/10/13 YOU WILL DECREASE TO 200 MG DAILY      . amLODipine (NORVASC) 5 MG tablet Take 1 tablet (5 mg total) by mouth daily.  90 tablet  3  . apixaban (ELIQUIS) 5 MG TABS tablet Take 1 tablet (5 mg total) by mouth 2 (two) times daily.  60 tablet  3  . Aspirin-Salicylamide-Caffeine (BC HEADACHE POWDER PO) Take 1 packet by mouth daily as needed (for headache).      Marland Kitchen atorvastatin (LIPITOR) 80 MG tablet Take 1 tablet (80 mg total) by mouth every evening.  90 tablet  3  . carvedilol (COREG) 12.5 MG tablet Take 1 tablet (12.5 mg total) by mouth 2 (two) times daily with a meal.  60 tablet  0  . clobetasol (OLUX) 0.05 % topical foam Apply topically 2 (two) times daily.  100 g  0  . clopidogrel (PLAVIX) 75 MG tablet Take 75 mg by mouth daily with breakfast.      . enalapril (VASOTEC) 10 MG tablet Take 1 tablet (10 mg total) by mouth 2 (two) times daily.  180 tablet  3  . furosemide (LASIX) 40 MG tablet Take 1 tablet (40 mg total) by mouth daily.  30 tablet  0  . isosorbide mononitrate (IMDUR) 30 MG 24 hr tablet Take 1 tablet (30 mg total) by mouth daily.  30 tablet  0  . loratadine (CLARITIN) 10 MG tablet Take 10 mg by mouth every evening.      Marland Kitchen spironolactone (ALDACTONE) 25 MG tablet Take 0.5 tablets (12.5 mg total) by mouth 2 (two) times daily.  30 tablet  6  . nitroGLYCERIN (NITROSTAT) 0.4 MG SL tablet Place 1 tablet (0.4 mg total) under the tongue every 5 (five) minutes as needed for chest pain.   25 tablet  prn    Current medications: Current Facility-Administered Medications  Medication Dose Route Frequency Provider Last Rate Last Dose  . 0.9 %  sodium chloride infusion   Intravenous Continuous Marlowe Shores, PA-C 50 mL/hr at 05/25/13 2004    . acetaminophen (TYLENOL) tablet 325-650 mg  325-650 mg Oral Q4H PRN Ames Coupe Rhyne, PA-C   650 mg at 05/25/13 1118   Or  . acetaminophen (TYLENOL) suppository 325-650 mg  325-650 mg Rectal Q4H PRN Samantha J Rhyne, PA-C      . albuterol (PROVENTIL HFA;VENTOLIN HFA) 108 (90 BASE) MCG/ACT inhaler 2 puff  2 puff Inhalation Q6H PRN Ames Coupe Rhyne, PA-C   2 puff at 05/22/13 1712  . alum & mag hydroxide-simeth (MAALOX/MYLANTA) 200-200-20 MG/5ML suspension 15-30 mL  15-30 mL Oral Q2H PRN Ames Coupe Rhyne, PA-C   30 mL at 05/22/13 0433  . amiodarone (PACERONE) tablet 200 mg  200 mg Oral Daily Samantha J Rhyne, PA-C   200 mg at 05/25/13 1110  . amLODipine (NORVASC) tablet 5 mg  5 mg Oral Daily Samantha J Rhyne, PA-C   5 mg at 05/25/13 1110  . apixaban (ELIQUIS) tablet 5 mg  5 mg  Oral BID Amelia Jo Roczniak, PA-C      . atorvastatin (LIPITOR) tablet 80 mg  80 mg Oral QPM Samantha J Rhyne, PA-C   80 mg at 05/25/13 1734  . carvedilol (COREG) tablet 12.5 mg  12.5 mg Oral BID WC Samantha J Rhyne, PA-C   12.5 mg at 05/25/13 1734  . Clobetasol Propionate Emulsion 0.05 % 1 application  1 application Apply externally BID Pryor Ochoa, MD   1 application at 05/25/13 2125  . clopidogrel (PLAVIX) tablet 75 mg  75 mg Oral Q breakfast Regina J Roczniak, PA-C      . docusate sodium (COLACE) capsule 100 mg  100 mg Oral Daily Ames Coupe Rhyne, PA-C   100 mg at 05/25/13 1110  . furosemide (LASIX) tablet 40 mg  40 mg Oral Daily Samantha J Rhyne, PA-C   40 mg at 05/25/13 1111  . guaiFENesin-dextromethorphan (ROBITUSSIN DM) 100-10 MG/5ML syrup 15 mL  15 mL Oral Q4H PRN Samantha J Rhyne, PA-C      . hydrALAZINE (APRESOLINE) injection 10 mg  10 mg Intravenous Q2H PRN  Samantha J Rhyne, PA-C      . isosorbide mononitrate (IMDUR) 24 hr tablet 30 mg  30 mg Oral Daily Samantha J Rhyne, PA-C   30 mg at 05/25/13 1111  . labetalol (NORMODYNE,TRANDATE) injection 10 mg  10 mg Intravenous Q2H PRN Samantha J Rhyne, PA-C      . loratadine (CLARITIN) tablet 10 mg  10 mg Oral QPM Samantha J Rhyne, PA-C   10 mg at 05/25/13 1735  . metoCLOPramide (REGLAN) 10 MG/10ML solution 5 mg  5 mg Oral TID WC & HS Amelia Jo Roczniak, PA-C   5 mg at 05/25/13 2124  . metoprolol (LOPRESSOR) injection 2-5 mg  2-5 mg Intravenous Q2H PRN Samantha J Rhyne, PA-C      . morphine 2 MG/ML injection 2-5 mg  2-5 mg Intravenous Q1H PRN Ames Coupe Rhyne, PA-C   2 mg at 05/22/13 0729  . nitroGLYCERIN (NITROSTAT) SL tablet 0.4 mg  0.4 mg Sublingual Q5 min PRN Samantha J Rhyne, PA-C      . ondansetron (ZOFRAN) injection 4 mg  4 mg Intravenous Q6H PRN Ames Coupe Rhyne, PA-C   4 mg at 05/25/13 1719  . oxyCODONE (Oxy IR/ROXICODONE) immediate release tablet 5-10 mg  5-10 mg Oral Q4H PRN Ames Coupe Rhyne, PA-C   10 mg at 05/25/13 1118  . pantoprazole (PROTONIX) EC tablet 40 mg  40 mg Oral Daily Samantha J Rhyne, PA-C   40 mg at 05/25/13 1000  . phenol (CHLORASEPTIC) mouth spray 1 spray  1 spray Mouth/Throat PRN Samantha J Rhyne, PA-C      . polyethylene glycol (MIRALAX / GLYCOLAX) packet 17 g  17 g Oral Daily Amelia Jo Roczniak, PA-C   17 g at 05/25/13 1111  . sodium chloride 0.9 % injection 3 mL  3 mL Intravenous Q12H Larina Earthly, MD   3 mL at 05/25/13 2124  . tamsulosin (FLOMAX) capsule 0.4 mg  0.4 mg Oral Daily Amelia Jo Roczniak, PA-C   0.4 mg at 05/25/13 1110  . zolpidem (AMBIEN) tablet 5 mg  5 mg Oral QHS PRN Ames Coupe Rhyne, PA-C          Vital Signs: Blood pressure 131/47, pulse 68, temperature 98.5 F (36.9 C), temperature source Oral, resp. rate 18, height 5' 8.5" (1.74 m), weight 195 lb 15.8 oz (88.9 kg), SpO2 93.00%.  Weight trends: [employer]   05/21/13  1335  Weight: 195 lb 15.8 oz (88.9  kg)    Physical Exam: General: Vital signs reviewed and noted. Well-developed, obese, in no acute distress; alert and oriented x 3  Head: Normocephalic, atraumatic.  Lungs:  Normal respiratory effort. Clear to auscultation BL without crackles or wheezes.  Heart: RRR. S1 and S2 normal without gallop, murmur, or rubs.  Abdomen:  BS normoactive. Soft, obese, Nondistended, non-tender.  No masses or organomegaly.  Extremities: 1+  Edema b/l  Neurologic: Alert and oriented x 3, grossly neurologically intact  Skin: No visible rashes, scars. Gauze in left groin area     Lab results: Basic Metabolic Panel:  Recent Labs Lab 05/24/13 0940 05/25/13 0450 05/26/13 0500  NA 124* 126* 127*  K 4.8 4.4 4.7  CL 91* 95* 95*  CO2 22 21 21   GLUCOSE 188* 111* 118*  BUN 50* 60* 61*  CREATININE 2.36* 2.60* 2.26*  CALCIUM 8.9 8.4 8.2*  PHOS  --   --  3.6    CBC:  Recent Labs Lab 05/22/13 0430 05/24/13 0940 05/25/13 0450 05/26/13 0500  WBC 13.5* 11.6* 10.0 9.0  HGB 11.3* 10.5* 9.1* 9.0*  HCT 33.1* 29.7* 26.3* 25.9*  MCV 90.9 90.8 91.0 91.5  PLT 187 181 159 168   Microbiology: Results for orders placed during the hospital encounter of 05/12/13  SURGICAL PCR SCREEN     Status: None   Collection Time    05/12/13  3:17 PM      Result Value Range Status   MRSA, PCR NEGATIVE  NEGATIVE Final   Staphylococcus aureus NEGATIVE  NEGATIVE Final   Comment:            The Xpert SA Assay (FDA     approved for NASAL specimens     in patients over 53 years of age),     is one component of     a comprehensive surveillance     program.  Test performance has     been validated by [employer] for patients greater     than or equal to 69 year old.     It is not intended     to diagnose infection nor to     guide or monitor treatment.     Urinalysis: Pending   Other labs pending:  Urine and serum os, Urine Na and Cr. B12, folate, iron, TIBC, Ferritin   Imaging: US Renal  05/25/2013    *RADIOLOGY REPORT*  Clinical Data: Acute renal failure.  RENAL/URINARY TRACT ULTRASOUND COMPLETE  Comparison:  None.  Findings:  Right Kidney:  13.6 cm in length.  The renal cortex is quite thin. No hydronephrosis.  2.7 cm cyst in the midportion of the kidney.  Left Kidney:  14.0 cm in length.  3.3 cm cyst on the upper pole. No hydronephrosis.  Bladder:  Normal.  Ureteral jets are not identified.  IMPRESSION:  1.  No evidence of renal obstruction. 2.  The renal cortex of the right kidney is diffusely thinned. 3.  Bilateral renal cysts.   Original Report Authenticated By: Francene Boyers, M.D.     Assessment & Plan: [name] is a  67 y.o. yo male with a PMHX of DM (HA1C 03/21/13 7.2%), dyslipidemia, HTN, depression, tobacco abuse, duodenal ulcer, PVD, CAD, chronic systolic CHF, anemia, COPD, Atrial fibrillation, MI (i.e NSTEMI 03/2013), basal cell cancer (face), who was admitted to [facility] on 05/21/2013 for left bypass graft fem-pop artery surgery.  He is  post op day 4 from vascular procedure (profunda fem bypass graft) for left limb critical ischemia noted 04/15/13 ABI and vascular contrast procedure 05/12/13. Vascular consulted Renal 8/18 for low urine output and renal failure.   1. Acute on Chronic renal failure  -Baseline Creatinine 1.2-1.3. Creatinine 2.26 (trending down todya) after vascular procedure 8/14 with vascular constrast procedure 8/5.  Contrast nephropathy is likely of acute on chronic renal failure within first 48 hours of contrast.  Another etiology of AKI would be the patient was still on the ACEI plus ischemic ATN due to hypotension and acute blood loss from vascular procedure.   Another dDx would be atheroembolic event. He also has possible right renal artery stenosis noted 05/12/13. Renal US with renal cortex thin on the right kidney and b/l renal cysts. No obstruction -Spironolactone held. Rec hold Vasotec 10 mg bid during AKI.  -strict i/o, daily weights. Urine output in 24 hours 1.16  L -pending FENA, repeat UA -avoid nephrotoxic drugs (i.e ACEI) -continue IVF -trend renal function panel  2. Hyponatremia, chronic  -Na 126 today. He is asymptomatic.  Could be secondary to underlying lung pathology b/l pulmonary nodules  -Needs further outpatient following with PCP if not already addressed -trend BMET, pending urine and serum os, FENA  3. Normocytic anemia  -Baseline hbg 11-13. Hbg 9.0 today like due to acute blood loss. Trend CBC -pending anemia panel   4. PVD s/p vascular procedure 8/5 and 8/14 -Vascular following  5. Diabetes HA1C 7.2 03/21/13 -monitor cbgs -will likely need SSI this admission  6. History Chronic systolic CHF and CAD (03/23/13 echo with EF 25-30%) and recent NSTEMI (03/2013) -Spironolactone held.  -Rec. Hold ACEI (Vasotec) during AKI -Still on Lasix 40 mg qd, Coreg 12.5 mg bid, Plavix 75 mg qd, Imdur 30 mg ER -strict i/o, daily weights  -Follows outpatient with Dr.Najer  7. HTN Continue Norvasc 5 mg qd, Hydralazine 10 mg q2 hrs prn, Labetalol 10 mg q2 hrs prn, prn Lopressor 2-5 mg q1 hours prn  -rec hold Vasotec 10 mg bid during AKI  8. History of Atrial fibrillation  -On Amiodarone, Eliquis, prn Lopressor  -monitor via tele   9. F/E/N -NS 50 cc/hr  -trend renal function panel  -cardiac diet   10. DVT PPX  -scds right. eliquis   PCP: Tereso Newcomer PA-C  Desma Maxim MD  PGY 2  Discussed with Dr. Arrie Aran

## 2013-05-27 LAB — RENAL FUNCTION PANEL
Albumin: 3.2 g/dL — ABNORMAL LOW (ref 3.5–5.2)
BUN: 46 mg/dL — ABNORMAL HIGH (ref 6–23)
CO2: 22 mEq/L (ref 19–32)
Chloride: 98 mEq/L (ref 96–112)
GFR calc non Af Amer: 39 mL/min — ABNORMAL LOW (ref >90)
Potassium: 5.3 mEq/L — ABNORMAL HIGH (ref 3.5–5.1)

## 2013-05-27 MED ORDER — SODIUM POLYSTYRENE SULFONATE 15 GM/60ML PO SUSP
30.0000 g | Freq: Once | ORAL | Status: AC
  Administered 2013-05-27: 30 g via ORAL
  Filled 2013-05-27: qty 120

## 2013-05-27 MED ORDER — FERROUS SULFATE 325 (65 FE) MG PO TABS
325.0000 mg | ORAL_TABLET | Freq: Two times a day (BID) | ORAL | Status: DC
Start: 2013-05-27 — End: 2013-05-27
  Filled 2013-05-27 (×2): qty 1

## 2013-05-27 MED ORDER — TAMSULOSIN HCL 0.4 MG PO CAPS: 0.4000 mg | ORAL_CAPSULE | Freq: Every day | ORAL | Status: DC

## 2013-05-27 MED ORDER — METOCLOPRAMIDE HCL 5 MG PO TABS: 5.0000 mg | ORAL_TABLET | Freq: Four times a day (QID) | ORAL | Status: DC

## 2013-05-27 NOTE — Progress Notes (Signed)
[facility] Progress NOTE    Subjective: Patient nauseated this am with smelling food.  Denies problems with urination, ab pain, chest pain  Medications: Outpatient medications: Prescriptions prior to admission  Medication Sig Dispense Refill  . albuterol (PROVENTIL HFA;VENTOLIN HFA) 108 (90 BASE) MCG/ACT inhaler Inhale 2 puffs into the lungs every 6 (six) hours as needed for wheezing or shortness of breath.  1 Inhaler  1  . amiodarone (PACERONE) 200 MG tablet Take 200 mg by mouth daily. AS OF 04/10/13 YOU WILL DECREASE TO 200 MG DAILY      . amLODipine (NORVASC) 5 MG tablet Take 1 tablet (5 mg total) by mouth daily.  90 tablet  3  . apixaban (ELIQUIS) 5 MG TABS tablet Take 1 tablet (5 mg total) by mouth 2 (two) times daily.  60 tablet  3  . Aspirin-Salicylamide-Caffeine (BC HEADACHE POWDER PO) Take 1 packet by mouth daily as needed (for headache).      Marland Kitchen atorvastatin (LIPITOR) 80 MG tablet Take 1 tablet (80 mg total) by mouth every evening.  90 tablet  3  . carvedilol (COREG) 12.5 MG tablet Take 1 tablet (12.5 mg total) by mouth 2 (two) times daily with a meal.  60 tablet  0  . clobetasol (OLUX) 0.05 % topical foam Apply topically 2 (two) times daily.  100 g  0  . clopidogrel (PLAVIX) 75 MG tablet Take 75 mg by mouth daily with breakfast.      . enalapril (VASOTEC) 10 MG tablet Take 1 tablet (10 mg total) by mouth 2 (two) times daily.  180 tablet  3  . furosemide (LASIX) 40 MG tablet Take 1 tablet (40 mg total) by mouth daily.  30 tablet  0  . isosorbide mononitrate (IMDUR) 30 MG 24 hr tablet Take 1 tablet (30 mg total) by mouth daily.  30 tablet  0  . loratadine (CLARITIN) 10 MG tablet Take 10 mg by mouth every evening.      Marland Kitchen spironolactone (ALDACTONE) 25 MG tablet Take 0.5 tablets (12.5 mg total) by mouth 2 (two) times daily.  30 tablet  6  . nitroGLYCERIN (NITROSTAT) 0.4 MG SL tablet Place 1 tablet (0.4 mg total) under the tongue every 5 (five) minutes as needed for chest  pain.  25 tablet  prn    Current medications: Current Facility-Administered Medications  Medication Dose Route Frequency Provider Last Rate Last Dose  . 0.9 %  sodium chloride infusion   Intravenous Continuous Amelia Jo Roczniak, PA-C 50 mL/hr at 05/26/13 1709    . acetaminophen (TYLENOL) tablet 325-650 mg  325-650 mg Oral Q4H PRN Ames Coupe Rhyne, PA-C   650 mg at 05/25/13 1118   Or  . acetaminophen (TYLENOL) suppository 325-650 mg  325-650 mg Rectal Q4H PRN Samantha J Rhyne, PA-C      . albuterol (PROVENTIL HFA;VENTOLIN HFA) 108 (90 BASE) MCG/ACT inhaler 2 puff  2 puff Inhalation Q6H PRN Ames Coupe Rhyne, PA-C   2 puff at 05/22/13 1712  . alum & mag hydroxide-simeth (MAALOX/MYLANTA) 200-200-20 MG/5ML suspension 15-30 mL  15-30 mL Oral Q2H PRN Ames Coupe Rhyne, PA-C   30 mL at 05/22/13 0433  . amiodarone (PACERONE) tablet 200 mg  200 mg Oral Daily Samantha J Rhyne, PA-C   200 mg at 05/26/13 1154  . amLODipine (NORVASC) tablet 5 mg  5 mg Oral Daily Samantha J Rhyne, PA-C   5 mg at 05/26/13 1153  . apixaban (ELIQUIS) tablet 5 mg  5 mg  Oral BID Marlowe Shores, PA-C   5 mg at 05/26/13 2113  . atorvastatin (LIPITOR) tablet 80 mg  80 mg Oral QPM Samantha J Rhyne, PA-C   80 mg at 05/26/13 1736  . carvedilol (COREG) tablet 12.5 mg  12.5 mg Oral BID WC Samantha J Rhyne, PA-C   12.5 mg at 05/27/13 0646  . Clobetasol Propionate Emulsion 0.05 % 1 application  1 application Apply externally BID Pryor Ochoa, MD   1 application at 05/26/13 1154  . clopidogrel (PLAVIX) tablet 75 mg  75 mg Oral Q breakfast Amelia Jo Roczniak, PA-C   75 mg at 05/27/13 4540  . docusate sodium (COLACE) capsule 100 mg  100 mg Oral Daily Samantha J Rhyne, PA-C   100 mg at 05/26/13 1153  . furosemide (LASIX) tablet 40 mg  40 mg Oral Daily Samantha J Rhyne, PA-C   40 mg at 05/26/13 1153  . guaiFENesin-dextromethorphan (ROBITUSSIN DM) 100-10 MG/5ML syrup 15 mL  15 mL Oral Q4H PRN Samantha J Rhyne, PA-C      . hydrALAZINE  (APRESOLINE) injection 10 mg  10 mg Intravenous Q2H PRN Samantha J Rhyne, PA-C      . isosorbide mononitrate (IMDUR) 24 hr tablet 30 mg  30 mg Oral Daily Samantha J Rhyne, PA-C   30 mg at 05/26/13 1153  . labetalol (NORMODYNE,TRANDATE) injection 10 mg  10 mg Intravenous Q2H PRN Samantha J Rhyne, PA-C      . loratadine (CLARITIN) tablet 10 mg  10 mg Oral QPM Samantha J Rhyne, PA-C   10 mg at 05/26/13 1737  . metoCLOPramide (REGLAN) 10 MG/10ML solution 5 mg  5 mg Oral TID WC & HS Amelia Jo Roczniak, PA-C   5 mg at 05/26/13 2221  . metoprolol (LOPRESSOR) injection 2-5 mg  2-5 mg Intravenous Q2H PRN Samantha J Rhyne, PA-C      . morphine 2 MG/ML injection 2-5 mg  2-5 mg Intravenous Q1H PRN Ames Coupe Rhyne, PA-C   2 mg at 05/22/13 0729  . nitroGLYCERIN (NITROSTAT) SL tablet 0.4 mg  0.4 mg Sublingual Q5 min PRN Samantha J Rhyne, PA-C      . ondansetron (ZOFRAN) injection 4 mg  4 mg Intravenous Q6H PRN Ames Coupe Rhyne, PA-C   4 mg at 05/25/13 1719  . oxyCODONE (Oxy IR/ROXICODONE) immediate release tablet 5-10 mg  5-10 mg Oral Q4H PRN Ames Coupe Rhyne, PA-C   10 mg at 05/27/13 0145  . pantoprazole (PROTONIX) EC tablet 40 mg  40 mg Oral Daily Samantha J Rhyne, PA-C   40 mg at 05/26/13 1154  . phenol (CHLORASEPTIC) mouth spray 1 spray  1 spray Mouth/Throat PRN Samantha J Rhyne, PA-C      . polyethylene glycol (MIRALAX / GLYCOLAX) packet 17 g  17 g Oral Daily Amelia Jo Roczniak, PA-C   17 g at 05/26/13 1154  . sodium chloride 0.9 % injection 3 mL  3 mL Intravenous Q12H Larina Earthly, MD   3 mL at 05/25/13 2124  . sodium polystyrene (KAYEXALATE) 15 GM/60ML suspension 30 g  30 g Oral Once Annett Gula, MD      . tamsulosin [facility]) capsule 0.4 mg  0.4 mg Oral Daily Amelia Jo Roczniak, PA-C   0.4 mg at 05/26/13 1152  . zolpidem (AMBIEN) tablet 5 mg  5 mg Oral QHS PRN Ames Coupe Rhyne, PA-C          Vital Signs: Blood pressure 136/47, pulse 61, temperature 98.3 F (36.8  C), temperature source Oral, resp. rate  18, height 5' 8.5" (1.74 m), weight 200 lb (90.719 kg), SpO2 93.00%.  Weight trends: Filed Weights   05/21/13 1335 05/27/13 0418  Weight: 195 lb 15.8 oz (88.9 kg) 200 lb (90.719 kg)    Physical Exam: General: Vital signs reviewed and noted. Well-developed, obese, in no acute distress; alert and oriented x 3  Head: Normocephalic, atraumatic.  Lungs:  Normal respiratory effort. Clear to auscultation BL without crackles or wheezes.  Heart: RRR. S1 and S2 normal without gallop, murmur, or rubs.  Abdomen:  BS normoactive. Soft, obese, Nondistended, non-tender.  No masses or organomegaly.  Extremities: 1+  Edema b/l, improved   Neurologic: Alert and oriented x 3, grossly neurologically intact  Skin: No visible rashes, scars.     Lab results: Basic Metabolic Panel:  Recent Labs Lab 05/25/13 0450 05/26/13 0500 05/27/13 0500  NA 126* 127* 130*  K 4.4 4.7 5.3*  CL 95* 95* 98  CO2 21 21 22   GLUCOSE 111* 118* 133*  BUN 60* 61* 46*  CREATININE 2.60* 2.26* 1.72*  CALCIUM 8.4 8.2* 8.8  PHOS  --  3.6 3.0    CBC:  Recent Labs Lab 05/22/13 0430 05/24/13 0940 05/25/13 0450 05/26/13 0500  WBC 13.5* 11.6* 10.0 9.0  HGB 11.3* 10.5* 9.1* 9.0*  HCT 33.1* 29.7* 26.3* 25.9*  MCV 90.9 90.8 91.0 91.5  PLT 187 181 159 168   Microbiology: Results for orders placed during the hospital encounter of 05/12/13  SURGICAL PCR SCREEN     Status: None   Collection Time    05/12/13  3:17 PM      Result Value Range Status   MRSA, PCR NEGATIVE  NEGATIVE Final   Staphylococcus aureus NEGATIVE  NEGATIVE Final   Comment:            The Xpert SA Assay (FDA     approved for NASAL specimens     in patients over 84 years of age),     is one component of     a comprehensive surveillance     program.  Test performance has     been validated by [employer] for patients greater     than or equal to 29 year old.     It is not intended     to diagnose infection nor to     guide or monitor  treatment.   Results for [name], [name] (MRN [id number]) as of 05/27/2013 07:03  Ref. Range 05/26/2013 05:00  Iron Latest Range: 42-135 ug/dL 20 (L)  UIBC Latest Range: 125-400 ug/dL 409  TIBC Latest Range: 215-435 ug/dL 811  Saturation Ratios Latest Range: 20-55 % 8 (L)  Ferritin Latest Range: 22-322 ng/mL 343 (H)  Folate No range found 10.2     Urinalysis: Results for [name], [name] (MRN [id number]) as of 05/27/2013 07:03  Ref. Range 05/26/2013 07:08  Color, Urine Latest Range: YELLOW  YELLOW  APPearance Latest Range: CLEAR  CLEAR  Specific Gravity, Urine Latest Range: 1.005-1.030  1.013  pH Latest Range: 5.0-8.0  5.5  Glucose Latest Range: NEGATIVE mg/dL NEGATIVE  Bilirubin Urine Latest Range: NEGATIVE  NEGATIVE  Ketones, ur Latest Range: NEGATIVE mg/dL NEGATIVE  Protein Latest Range: NEGATIVE mg/dL 30 (A)  Urobilinogen, UA Latest Range: 0.0-1.0 mg/dL 0.2  Nitrite Latest Range: NEGATIVE  NEGATIVE  Leukocytes, UA Latest Range: NEGATIVE  NEGATIVE  Hgb urine dipstick Latest Range: NEGATIVE  NEGATIVE  WBC, UA  Latest Range: <3 WBC/hpf 0-2  Squamous Epithelial / LPF Latest Range: RARE  RARE  Osmolality, Ur Latest Range: 607-189-7982 mOsm/kg 374 (L)  Sodium, Ur No range found 15  Creatinine, Urine No range found 81.52   FENA 0.3% Imaging: US Renal  05/25/2013   *RADIOLOGY REPORT*  Clinical Data: Acute renal failure.  RENAL/URINARY TRACT ULTRASOUND COMPLETE  Comparison:  None.  Findings:  Right Kidney:  13.6 cm in length.  The renal cortex is quite thin. No hydronephrosis.  2.7 cm cyst in the midportion of the kidney.  Left Kidney:  14.0 cm in length.  3.3 cm cyst on the upper pole. No hydronephrosis.  Bladder:  Normal.  Ureteral jets are not identified.  IMPRESSION:  1.  No evidence of renal obstruction. 2.  The renal cortex of the right kidney is diffusely thinned. 3.  Bilateral renal cysts.   Original Report Authenticated By: Francene Boyers, M.D.     Assessment & Plan: Mr. Dini is a   67 y.o. yo male with a PMHX of DM (HA1C 03/21/13 7.2%), dyslipidemia, HTN, depression, tobacco abuse, duodenal ulcer, PVD, CAD, chronic systolic CHF, anemia, COPD, Atrial fibrillation, MI (i.e NSTEMI 03/2013), basal cell cancer (face), who was admitted to [facility] on 05/21/2013 for left bypass graft fem-pop artery surgery.  He is post op day 4 from vascular procedure (profunda fem bypass graft) for left limb critical ischemia noted 04/15/13 ABI and vascular contrast procedure 05/12/13. Vascular consulted Renal 8/18 for low urine output and renal failure.   1. Acute on Chronic renal failure (imroving) -Baseline Creatinine 1.2-1.3. Creatinine 1.76 (trending down today) after vascular procedure 8/14 with vascular constrast procedure 8/5.  Etiology AKI would be the patient was still on the ACEI plus ischemic ATN due to hypotension and acute blood loss from vascular procedure.   Another dDx would be atheroembolic event. He also has possible right renal artery stenosis noted 05/12/13. Renal US with renal cortex thin on the right kidney and b/l renal cysts. No obstruction -Held Spironolactone and Vasotec 10 mg bid during AKI.  -strict i/o, daily weights. Urine output in 24 hours 1.975  L -avoid nephrotoxic drugs (i.e ACEI) -continue IVF -trend renal function panel  2. Hyponatremia, chronic  -Na 126 today. He is asymptomatic.  Urine os 374 (low), serum os 285.  Could be secondary to underlying lung pathology b/l pulmonary nodules  -Needs further outpatient following with PCP if not already addressed -trend BMET  3. Normocytic anemia  -Baseline hbg 11-13. Hbg 9.0 today like due to acute blood loss. Trend CBC -Fe def anemia with anemia panel.  Will likely need iron   4. Hyperkalemia  -5.3 this am. Given Kayexylate 30 g x 1  5. PVD s/p vascular procedure 8/5 and 8/14 -Vascular following  6. Diabetes HA1C 7.2 03/21/13 -monitor cbgs -will likely need SSI this admission  7. History Chronic systolic CHF and CAD  (03/23/13 echo with EF 25-30%) and recent NSTEMI (03/2013) -Spironolactone and ACEI (Vasotec) held during AKI -Still on Lasix 40 mg qd, Coreg 12.5 mg bid, Plavix 75 mg qd, Imdur 30 mg ER -strict i/o, daily weights  -Follows outpatient with Dr.Najer  8. HTN Continue Norvasc 5 mg qd, Hydralazine 10 mg q2 hrs prn, Labetalol 10 mg q2 hrs prn, prn Lopressor 2-5 mg q1 hours prn  -rec hold Vasotec 10 mg bid during AKI  9. History of Atrial fibrillation  -On Amiodarone, Eliquis, prn Lopressor  -monitor via tele   10. F/E/N -NS 50  cc/hr  -trend renal function panel  -cardiac diet   11. DVT PPX  -scds right. eliquis   Dispo: Likely can discharge today with outpatient follow up.    PCP: Tereso Newcomer PA-C  Desma Maxim MD  PGY 2  Discussed with Dr. Arrie Aran

## 2013-05-27 NOTE — Progress Notes (Signed)
Patient ID: [name], male   DOB: 06-10-1946, 67 y.o.   MRN: [id number] Vascular Surgery Progress Note  Subjective: Doing well post resection left common femoral artery with insertion of left external iliac to profunda femoris bypass for extensive heavily calcified occlusive left common femoral plaque. Patient denies rest pain left foot. He is beginning to ambulate without help. Continues to get stronger with good appetite. General alert and oriented x3 Left inguinal wound is healing satisfactorily with 3+ palpable pulse. Left foot adequately perfused with no tenderness.  Objective:  Filed Vitals:   05/27/13 0418  BP: 136/47  Pulse: 61  Temp: 98.3 F (36.8 C)  Resp: 18       Labs:  Recent Labs Lab 05/25/13 0450 05/26/13 0500 05/27/13 0500  CREATININE 2.60* 2.26* 1.72*    Recent Labs Lab 05/25/13 0450 05/26/13 0500 05/27/13 0500  NA 126* 127* 130*  K 4.4 4.7 5.3*  CL 95* 95* 98  CO2 21 21 22   BUN 60* 61* 46*  CREATININE 2.60* 2.26* 1.72*  GLUCOSE 111* 118* 133*  CALCIUM 8.4 8.2* 8.8    Recent Labs Lab 05/24/13 0940 05/25/13 0450 05/26/13 0500  WBC 11.6* 10.0 9.0  HGB 10.5* 9.1* 9.0*  HCT 29.7* 26.3* 25.9*  PLT 181 159 168   No results found for this basename: INR,  in the last 168 hours  I/O last 3 completed shifts: In: 1850 [P.O.:1200; I.V.:650] Out: 2785 [Urine:2785]  Imaging: US Renal  05/25/2013   *RADIOLOGY REPORT*  Clinical Data: Acute renal failure.  RENAL/URINARY TRACT ULTRASOUND COMPLETE  Comparison:  None.  Findings:  Right Kidney:  13.6 cm in length.  The renal cortex is quite thin. No hydronephrosis.  2.7 cm cyst in the midportion of the kidney.  Left Kidney:  14.0 cm in length.  3.3 cm cyst on the upper pole. No hydronephrosis.  Bladder:  Normal.  Ureteral jets are not identified.  IMPRESSION:  1.  No evidence of renal obstruction. 2.  The renal cortex of the right kidney is diffusely thinned. 3.  Bilateral renal cysts.   Original Report  Authenticated By: Francene Boyers, M.D.    Assessment/Plan:  POD #6   LOS: 6 days  s/p Procedure(s): External iliac endarterectomy with external iliac to profunda femorous bypass graft using 8mm Hemashield graft.  The patient's creatinine has returned essentially to normal at 1.6. Appreciate renal services help. Plan discharge home today and will followup patient in 2 weeks   Josephina Gip, MD 05/27/2013 10:47 AM

## 2013-05-27 NOTE — Care Management Note (Signed)
    Page 1 of 2   05/27/2013     2:26:48 PM   CARE MANAGEMENT NOTE 05/27/2013  Patient:  [name], [name]   Account Number:  [id number]  Date Initiated:  05/24/2013  Documentation initiated by:  [facility]  Subjective/Objective Assessment:   PT S/P ILIO-FEMORAL BYPASS GRAFT ON 8/14.  PTA, PT RESIDES AT HOME WITH SPOUSE.     Action/Plan:   WILL FOLLOW FOR HOME NEEDS AS PT PROGRESSES.   Anticipated DC Date:  05/28/2013   Anticipated DC Plan:  HOME W HOME HEALTH SERVICES      DC Planning Services  CM consult      [facility] Choice  HOME HEALTH   Choice offered to / List presented to:  C-1 Patient   DME arranged  Levan Hurst      DME agency  [facility]     [facility] arranged  HH-1 RN  HH-2 PT      [facility] agency  [facility]   Status of service:  Completed, signed off Medicare Important Message given?   (If response is "NO", the following Medicare IM given date fields will be blank) Date Medicare IM given:   Date Additional Medicare IM given:    Discharge Disposition:  HOME W HOME HEALTH SERVICES  Per UR Regulation:  Reviewed for med. necessity/level of care/duration of stay  If discussed at Long Length of Stay Meetings, dates discussed:    Comments:  05/27/13 Samer Dutton,RN,BSN [phone] PT FOR DC HOME TODAY WITH WIFE AND HH SERVICES AS ARRANGED. NOTIFIED [facility] OF DC DATE.  05/24/2013 1130 NCM spoke to pt and he had [facility] in the past. Requesting their services. Faxed referral to [facility] and contacted [facility] DME rep for RW. Isidoro Donning RN CCM Case Mgmt phone [phone]

## 2013-05-27 NOTE — Progress Notes (Signed)
I have seen and examined this patient and agree with plan as outlined by Dr. Shirlee Latch.  Pt's Scr is trending toward baseline.  Stable for discharge home and close f/u with his PCP.  If his Scr does not return to baseline he can f/u with me in 1-2 months.  Would NOT resume altace at this time and would wait for f/u with PCP and cont close monitoring of K and Scr as an outpt.  Please call with questions/concerns. Azalya Galyon A,MD 05/27/2013 10:09 AM

## 2013-05-27 NOTE — Discharge Summary (Signed)
[facility] Discharge Summary   Patient ID:  [name] MRN: [id number] DOB/AGE: 05/21/1946 67 y.o.  Admit date: 05/21/2013 Discharge date: 05/27/2013 Date of Surgery: 05/21/2013 Surgeon: Surgeon(s): Pryor Ochoa, MD  Admission Diagnosis: LEFT AIOD  FPOD  Discharge Diagnoses:  LEFT AIOD  FPOD  Secondary Diagnoses: Past Medical History  Diagnosis Date  . Hyperlipidemia   . Hypertension   . Depression   . Tobacco abuse   . Duodenal ulcer     AGE 19  . PVD (peripheral vascular disease)     prior extensive endarterectomy of the right external iliac, common femoral bypass, prior R CEA. R iliac disease noted during 03/2013 cath.  . Ischemic leg 2009    with extensive endarterectomy  . Coronary artery disease     a. anterior MI in 1989. b. MI with CABG in 1998. c. Small NSTEMI s/p DES to SVG-OM 02/2012, newly recognized LV dysfunction at that time. d. NSTEMI 03/2013: occlusion of OM branch after touchdown of SVG, likely the culprit, too small for PCI, for med rx.  . Chronic systolic CHF (congestive heart failure)     a. EF 35% by cath 02/2012. b. EF 25-30% by echo 03/2013.  Marland Kitchen Anemia     Instructed 02/2012 to f/u with PCP  . Nocturnal oxygen desaturation 02/2012  . COPD (chronic obstructive pulmonary disease)     a. Suspected during admit 03/2013.  Marland Kitchen Atrial fibrillation     a. Transient during 03/2013 admission, started on apixaban/amiodarone.  . Myocardial infarction   . 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 Complication of anesthesia     Lungs filled up with fluid when he had "gas anesthesia."    Procedure(s): External iliac endarterectomy with external iliac to profunda femorous bypass graft using 8mm Hemashield graft.  Discharged Condition: good  HPI:  [name] is a 67 y.o. male having previously undergone right femoral-popliteal bypass graft with Gore-Tex in 2009 as well as a right carotid endarterectomy in the past. Patient now reports with essentially rest  pain in the left leg which has been present for several months but slowly worsening. He has no history of nonhealing ulcers infection or gangrene. He is unable to ambulate 50 yards. He wakens at night with discomfort in the left foot pains in the dependent position. He does have a significant history of coronary artery disease having previously undergone coronary artery bypass grafting. He had a non-STEMI a few months ago and is being treated medically for his severe coronary artery disease by Dr. Delane Ginger. His EF is approximately 30-35%. He denies any active chest pain at present time.  Pt had angiogram which showed Severe left iliac, common femoral, and profunda femoris occlusive disease with occlusion of left superficial femoral artery and rest ischemia left foot He was scheduled for left external/CFA endarterectomy and poss fem-pop bypass on Left  Hospital Course:  [name] is a 67 y.o. male is S/P Left Procedure(s): External iliac endarterectomy with external iliac to profunda femorous bypass graft using 8mm Hemashield graft. Extubated: POD # 0 Physical exam: LLE wounds healing well. Pt had had some skin bleeding without hematoma from the left groin incision. This resolved off eliquis and plavix for 2 days Post-op wounds healing well Pt. Ambulating, voiding and taking PO diet without difficulty. Pt pain controlled with PO pain meds. Labs as below Complications: 1. acute on chronic Kidney disease - resolved back to pre-op CR of 1.7 with holding  ACE Inhibitor and spironolactone. Pt to see PCP to evaluate meds 2. Bleeding from wound stopped with plavix and eliquis stopped x 2 days - pt will resume at home 3. Pt had some urinary diff- flomax started 4. Pt with N/V resolved with BM and - reglan started  Consults:  Treatment Team:  Irena Cords, MD  Significant Diagnostic Studies: CBC Lab Results  Component Value Date   WBC 9.0 05/26/2013   HGB 9.0* 05/26/2013   HCT 25.9*  05/26/2013   MCV 91.5 05/26/2013   PLT 168 05/26/2013    BMET    Component Value Date/Time   NA 130* 05/27/2013 0500   K 5.3* 05/27/2013 0500   CL 98 05/27/2013 0500   CO2 22 05/27/2013 0500   GLUCOSE 133* 05/27/2013 0500   BUN 46* 05/27/2013 0500   CREATININE 1.72* 05/27/2013 0500   CALCIUM 8.8 05/27/2013 0500   GFRNONAA 39* 05/27/2013 0500   GFRAA 45* 05/27/2013 0500   COAG Lab Results  Component Value Date   INR 1.15 05/12/2013   INR 0.99 03/21/2013   INR 1.06 02/18/2012     Disposition:  Discharge to :Home Discharge Orders   Future Appointments Provider Department Dept Phone   06/09/2013 9:45 AM Pryor Ochoa, MD [facility] [phone]   Future Orders Complete By Expires   Call MD for:  redness, tenderness, or signs of infection (pain, swelling, bleeding, redness, odor or green/yellow discharge around incision site)  As directed    Call MD for:  severe or increased pain, loss or decreased feeling  in affected limb(s)  As directed    Call MD for:  temperature >100.5  As directed    Discharge patient  As directed    Comments:     Discharge pt to home   Discharge wound care:  As directed    Comments:     Wash the groin wound with soap and water daily and pat dry. (No tub bath-only shower)  Then put a dry gauze or washcloth there to keep this area dry daily and as needed.  Do not use Vaseline or neosporin on your incisions.  Only use soap and water on your incisions and then protect and keep dry.   Driving Restrictions  As directed    Comments:     No driving for 2 weeks   Lifting restrictions  As directed    Comments:     No heavy lifting for 6 weeks   Nursing communication  As directed    Scheduling Instructions:     Please give paper Rx to patient at discharge.  It is located on the paper chart.   Resume previous diet  As directed        Medication List    STOP taking these medications       enalapril 10 MG tablet  Commonly known as:   VASOTEC     spironolactone 25 MG tablet  Commonly known as:  ALDACTONE      TAKE these medications       albuterol 108 (90 BASE) MCG/ACT inhaler  Commonly known as:  PROVENTIL HFA;VENTOLIN HFA  Inhale 2 puffs into the lungs every 6 (six) hours as needed for wheezing or shortness of breath.     amiodarone 200 MG tablet  Commonly known as:  PACERONE  Take 200 mg by mouth daily. AS OF 04/10/13 YOU WILL DECREASE TO 200 MG DAILY     amLODipine 5 MG tablet  Commonly known as:  NORVASC  Take 1 tablet (5 mg total) by mouth daily.     apixaban 5 MG Tabs tablet  Commonly known as:  ELIQUIS  Take 1 tablet (5 mg total) by mouth 2 (two) times daily.     atorvastatin 80 MG tablet  Commonly known as:  LIPITOR  Take 1 tablet (80 mg total) by mouth every evening.     BC HEADACHE POWDER PO  Take 1 packet by mouth daily as needed (for headache).     carvedilol 12.5 MG tablet  Commonly known as:  COREG  Take 1 tablet (12.5 mg total) by mouth 2 (two) times daily with a meal.     clobetasol 0.05 % topical foam  Commonly known as:  OLUX  Apply topically 2 (two) times daily.     clopidogrel 75 MG tablet  Commonly known as:  PLAVIX  Take 75 mg by mouth daily with breakfast.     furosemide 40 MG tablet  Commonly known as:  LASIX  Take 1 tablet (40 mg total) by mouth daily.     isosorbide mononitrate 30 MG 24 hr tablet  Commonly known as:  IMDUR  Take 1 tablet (30 mg total) by mouth daily.     loratadine 10 MG tablet  Commonly known as:  CLARITIN  Take 10 mg by mouth every evening.     metoCLOPramide 5 MG tablet  Commonly known as:  REGLAN  Take 1 tablet (5 mg total) by mouth 4 (four) times daily.     nitroGLYCERIN 0.4 MG SL tablet  Commonly known as:  NITROSTAT  Place 1 tablet (0.4 mg total) under the tongue every 5 (five) minutes as needed for chest pain.     oxyCODONE 5 MG immediate release tablet  Commonly known as:  ROXICODONE  Take 1 tablet (5 mg total) by mouth every 6  (six) hours as needed for pain.     tamsulosin 0.4 MG Caps capsule  Commonly known as:  FLOMAX  Take 1 capsule (0.4 mg total) by mouth daily.       Verbal and written Discharge instructions given to the patient. Wound care per Discharge AVS     Follow-up Information   Follow up with Josephina Gip, MD In 2 weeks. (Office will call you to arrange your appt (sent))    Specialty:  Vascular Surgery   Contact information:   [address] [place] [address] [phone]       Follow up with [facility]. (Home Health Physcial Therapy and RN)    Contact information:   [address] [place] [address] [phone]       Follow up with Nicki Reaper, NP In 1 week. (call to make appt to review medications)    Specialty:  Internal Medicine   Contact information:   [address] [place] [address] [phone]       Signed: Marlowe Shores 05/27/2013, 11:22 AM

## 2013-05-28 ENCOUNTER — Encounter (HOSPITAL_COMMUNITY): Payer: Self-pay | Admitting: Emergency Medicine

## 2013-05-28 ENCOUNTER — Emergency Department (HOSPITAL_COMMUNITY): Payer: Medicare Other

## 2013-05-28 ENCOUNTER — Inpatient Hospital Stay (HOSPITAL_COMMUNITY)
Admission: EM | Admit: 2013-05-28 | Discharge: 2013-05-30 | DRG: 280 | Disposition: A | Discharge status: Home / Self Care | Payer: Medicare Other | Attending: Internal Medicine | Admitting: Internal Medicine

## 2013-05-28 ENCOUNTER — Encounter: Payer: Self-pay | Admitting: Internal Medicine

## 2013-05-28 ENCOUNTER — Inpatient Hospital Stay (HOSPITAL_COMMUNITY): Payer: Medicare Other

## 2013-05-28 DIAGNOSIS — R0789 Other chest pain: Secondary | ICD-10-CM

## 2013-05-28 DIAGNOSIS — F329 Major depressive disorder, single episode, unspecified: Secondary | ICD-10-CM | POA: Diagnosis present

## 2013-05-28 DIAGNOSIS — N179 Acute kidney failure, unspecified: Secondary | ICD-10-CM

## 2013-05-28 DIAGNOSIS — I5023 Acute on chronic systolic (congestive) heart failure: Secondary | ICD-10-CM

## 2013-05-28 DIAGNOSIS — E871 Hypo-osmolality and hyponatremia: Secondary | ICD-10-CM

## 2013-05-28 DIAGNOSIS — Z8249 Family history of ischemic heart disease and other diseases of the circulatory system: Secondary | ICD-10-CM

## 2013-05-28 DIAGNOSIS — R079 Chest pain, unspecified: Secondary | ICD-10-CM

## 2013-05-28 DIAGNOSIS — F3289 Other specified depressive episodes: Secondary | ICD-10-CM | POA: Diagnosis present

## 2013-05-28 DIAGNOSIS — Z951 Presence of aortocoronary bypass graft: Secondary | ICD-10-CM

## 2013-05-28 DIAGNOSIS — Z801 Family history of malignant neoplasm of trachea, bronchus and lung: Secondary | ICD-10-CM

## 2013-05-28 DIAGNOSIS — Z9861 Coronary angioplasty status: Secondary | ICD-10-CM

## 2013-05-28 DIAGNOSIS — E876 Hypokalemia: Secondary | ICD-10-CM | POA: Diagnosis not present

## 2013-05-28 DIAGNOSIS — J4489 Other specified chronic obstructive pulmonary disease: Secondary | ICD-10-CM | POA: Diagnosis present

## 2013-05-28 DIAGNOSIS — I214 Non-ST elevation (NSTEMI) myocardial infarction: Principal | ICD-10-CM

## 2013-05-28 DIAGNOSIS — I5022 Chronic systolic (congestive) heart failure: Secondary | ICD-10-CM

## 2013-05-28 DIAGNOSIS — Z9089 Acquired absence of other organs: Secondary | ICD-10-CM

## 2013-05-28 DIAGNOSIS — I10 Essential (primary) hypertension: Secondary | ICD-10-CM

## 2013-05-28 DIAGNOSIS — Z8262 Family history of osteoporosis: Secondary | ICD-10-CM

## 2013-05-28 DIAGNOSIS — I739 Peripheral vascular disease, unspecified: Secondary | ICD-10-CM

## 2013-05-28 DIAGNOSIS — I252 Old myocardial infarction: Secondary | ICD-10-CM

## 2013-05-28 DIAGNOSIS — I2510 Atherosclerotic heart disease of native coronary artery without angina pectoris: Secondary | ICD-10-CM

## 2013-05-28 DIAGNOSIS — E785 Hyperlipidemia, unspecified: Secondary | ICD-10-CM

## 2013-05-28 DIAGNOSIS — F17200 Nicotine dependence, unspecified, uncomplicated: Secondary | ICD-10-CM | POA: Diagnosis present

## 2013-05-28 DIAGNOSIS — I509 Heart failure, unspecified: Secondary | ICD-10-CM

## 2013-05-28 DIAGNOSIS — I369 Nonrheumatic tricuspid valve disorder, unspecified: Secondary | ICD-10-CM

## 2013-05-28 DIAGNOSIS — J449 Chronic obstructive pulmonary disease, unspecified: Secondary | ICD-10-CM | POA: Diagnosis present

## 2013-05-28 DIAGNOSIS — I4891 Unspecified atrial fibrillation: Secondary | ICD-10-CM

## 2013-05-28 HISTORY — DX: Angina pectoris, unspecified: I20.9

## 2013-05-28 LAB — CBC
Hemoglobin: 10.4 g/dL — ABNORMAL LOW (ref 13.0–17.0)
MCV: 89.1 fL (ref 78.0–100.0)
Platelets: 219 10*3/uL (ref 150–400)
RBC: 3.22 MIL/uL — ABNORMAL LOW (ref 4.22–5.81)
WBC: 12.1 10*3/uL — ABNORMAL HIGH (ref 4.0–10.5)

## 2013-05-28 LAB — TROPONIN I
Troponin I: <0.3 ng/mL
Troponin I: 0.4 ng/mL
Troponin I: 0.39 ng/mL

## 2013-05-28 LAB — BASIC METABOLIC PANEL
CO2: 22 mEq/L (ref 19–32)
Chloride: 95 mEq/L — ABNORMAL LOW (ref 96–112)
Sodium: 131 mEq/L — ABNORMAL LOW (ref 135–145)

## 2013-05-28 LAB — PRO B NATRIURETIC PEPTIDE: Pro B Natriuretic peptide (BNP): 7588 pg/mL — ABNORMAL HIGH (ref 0–125)

## 2013-05-28 LAB — TSH: TSH: 3.396 u[IU]/mL (ref 0.350–4.500)

## 2013-05-28 MED ORDER — MORPHINE SULFATE 2 MG/ML IJ SOLN
2.0000 mg | INTRAMUSCULAR | Status: DC | PRN
  Administered 2013-05-28 (×2): 2 mg via INTRAVENOUS
  Filled 2013-05-28 (×2): qty 1

## 2013-05-28 MED ORDER — CARVEDILOL 12.5 MG PO TABS
12.5000 mg | ORAL_TABLET | Freq: Two times a day (BID) | ORAL | Status: DC
  Administered 2013-05-28 – 2013-05-30 (×5): 12.5 mg via ORAL
  Filled 2013-05-28 (×7): qty 1

## 2013-05-28 MED ORDER — OXYCODONE HCL 5 MG PO TABS
5.0000 mg | ORAL_TABLET | Freq: Four times a day (QID) | ORAL | Status: DC | PRN
  Administered 2013-05-28 – 2013-05-30 (×3): 5 mg via ORAL
  Filled 2013-05-28 (×3): qty 1

## 2013-05-28 MED ORDER — NITROGLYCERIN 0.4 MG SL SUBL: 0.4000 mg | SUBLINGUAL_TABLET | SUBLINGUAL | Status: DC | PRN

## 2013-05-28 MED ORDER — SODIUM CHLORIDE 0.9 % IJ SOLN
3.0000 mL | Freq: Two times a day (BID) | INTRAMUSCULAR | Status: DC
  Administered 2013-05-28 – 2013-05-29 (×4): 3 mL via INTRAVENOUS

## 2013-05-28 MED ORDER — ONDANSETRON HCL 4 MG/2ML IJ SOLN: 4.0000 mg | Freq: Four times a day (QID) | INTRAMUSCULAR | Status: DC | PRN

## 2013-05-28 MED ORDER — FUROSEMIDE 40 MG PO TABS
40.0000 mg | ORAL_TABLET | Freq: Every day | ORAL | Status: DC
  Administered 2013-05-28 – 2013-05-30 (×3): 40 mg via ORAL
  Filled 2013-05-28 (×3): qty 1

## 2013-05-28 MED ORDER — SODIUM CHLORIDE 0.9 % IJ SOLN: 3.0000 mL | INTRAMUSCULAR | Status: DC | PRN

## 2013-05-28 MED ORDER — AMIODARONE HCL 200 MG PO TABS
200.0000 mg | ORAL_TABLET | Freq: Every day | ORAL | Status: DC
  Administered 2013-05-28 – 2013-05-29 (×2): 200 mg via ORAL
  Filled 2013-05-28 (×2): qty 1

## 2013-05-28 MED ORDER — TECHNETIUM TC 99M DIETHYLENETRIAME-PENTAACETIC ACID: 40.0000 | Freq: Once | INTRAVENOUS | Status: AC | PRN

## 2013-05-28 MED ORDER — ISOSORBIDE MONONITRATE ER 30 MG PO TB24
30.0000 mg | ORAL_TABLET | Freq: Every day | ORAL | Status: DC
  Administered 2013-05-28 – 2013-05-30 (×3): 30 mg via ORAL
  Filled 2013-05-28 (×3): qty 1

## 2013-05-28 MED ORDER — SODIUM CHLORIDE 0.9 % IJ SOLN
3.0000 mL | Freq: Two times a day (BID) | INTRAMUSCULAR | Status: DC
  Administered 2013-05-29: 3 mL via INTRAVENOUS

## 2013-05-28 MED ORDER — ATORVASTATIN CALCIUM 80 MG PO TABS
80.0000 mg | ORAL_TABLET | Freq: Every evening | ORAL | Status: DC
  Administered 2013-05-28 – 2013-05-29 (×2): 80 mg via ORAL
  Filled 2013-05-28 (×3): qty 1

## 2013-05-28 MED ORDER — DOCUSATE SODIUM 100 MG PO CAPS
100.0000 mg | ORAL_CAPSULE | Freq: Two times a day (BID) | ORAL | Status: DC
  Administered 2013-05-28 – 2013-05-30 (×5): 100 mg via ORAL
  Filled 2013-05-28 (×6): qty 1

## 2013-05-28 MED ORDER — APIXABAN 5 MG PO TABS
5.0000 mg | ORAL_TABLET | Freq: Two times a day (BID) | ORAL | Status: DC
Start: 2013-05-28 — End: 2013-05-30
  Administered 2013-05-28 – 2013-05-30 (×5): 5 mg via ORAL
  Filled 2013-05-28 (×6): qty 1

## 2013-05-28 MED ORDER — CLOPIDOGREL BISULFATE 75 MG PO TABS
75.0000 mg | ORAL_TABLET | Freq: Every day | ORAL | Status: DC
  Administered 2013-05-28 – 2013-05-30 (×3): 75 mg via ORAL
  Filled 2013-05-28 (×4): qty 1

## 2013-05-28 MED ORDER — ONDANSETRON HCL 4 MG PO TABS
4.0000 mg | ORAL_TABLET | Freq: Four times a day (QID) | ORAL | Status: DC | PRN
  Administered 2013-05-29: 4 mg via ORAL
  Filled 2013-05-28: qty 1

## 2013-05-28 MED ORDER — AMLODIPINE BESYLATE 5 MG PO TABS
5.0000 mg | ORAL_TABLET | Freq: Every day | ORAL | Status: DC
  Administered 2013-05-28 – 2013-05-30 (×3): 5 mg via ORAL
  Filled 2013-05-28 (×3): qty 1

## 2013-05-28 MED ORDER — TAMSULOSIN HCL 0.4 MG PO CAPS
0.4000 mg | ORAL_CAPSULE | Freq: Every day | ORAL | Status: DC
Start: 2013-05-28 — End: 2013-05-30
  Administered 2013-05-28 – 2013-05-30 (×3): 0.4 mg via ORAL
  Filled 2013-05-28 (×3): qty 1

## 2013-05-28 MED ORDER — SODIUM CHLORIDE 0.9 % IV SOLN: 250.0000 mL | INTRAVENOUS | Status: DC | PRN

## 2013-05-28 MED ORDER — TECHNETIUM TO 99M ALBUMIN AGGREGATED
6.0000 | Freq: Once | INTRAVENOUS | Status: AC | PRN
  Administered 2013-05-28: 6 via INTRAVENOUS

## 2013-05-28 MED ORDER — DILTIAZEM HCL 25 MG/5ML IV SOLN
10.0000 mg | Freq: Once | INTRAVENOUS | Status: AC
  Administered 2013-05-28: 10 mg via INTRAVENOUS
  Filled 2013-05-28: qty 5

## 2013-05-28 MED ORDER — FUROSEMIDE 10 MG/ML IJ SOLN
40.0000 mg | Freq: Once | INTRAMUSCULAR | Status: AC
  Administered 2013-05-28: 40 mg via INTRAVENOUS
  Filled 2013-05-28: qty 4

## 2013-05-28 NOTE — ED Notes (Signed)
Patient transported to X-ray 

## 2013-05-28 NOTE — Clinical Documentation Improvement (Signed)
THIS DOCUMENT IS NOT A PERMANENT PART OF THE MEDICAL RECORD  Please update your documentation with the medical record to reflect your response to this query. If you need help knowing how to do this please call [phone].  05/28/13   Dear Dr. Suanne Marker / Associates,  In a better effort to capture your patient's severity of illness, reflect appropriate length of stay and utilization of resources, a review of the patient medical record has revealed the following indicators.    Based on your clinical judgment, please clarify and document in a progress note and/or discharge summary the clinical condition associated with the following supporting information:    Possible Clinical Conditions?  ____Hyponatremia  ____Other Condition  ____Cannot Clinically Determine     Lab: Sodium: 8/20:  130 8/21:  131   You may use possible, probable, or suspect with inpatient documentation. possible, probable, suspected diagnoses MUST be documented at the time of discharge  Reviewed: additional documentation in the medical record  Thank You,  Marciano Sequin,  Clinical Documentation Specialist: [phone] [facility] [place]
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
## 2013-05-28 NOTE — Progress Notes (Signed)
CRITICAL VALUE ALERT  Critical value received:  Troponin 0.40  Date of notification:  05/28/2013  Time of notification:  1200  Critical value read back:yes  Nurse who received alert:  Jeremy Johann, RN  MD notified (1st page):  Dr. Adelfa Koh  Time of first page:  1205  Responding MD:    Time MD responded:

## 2013-05-28 NOTE — ED Notes (Addendum)
Pt reports he was discharged from our facility yesterday, did not go by to pick up his medication. Pt reports approx 2330 he set on the side of his bed at home and had a sudden onset of intermittent mid-sternum chest pain radiating to his Left breast, SOB, and back pain to mid back between his shoulder blades.  Pt's friend is at bedside, pt's friend brought him a Hydrocodone/acetaminophen 10/325 mg x1 and pt took nitro sl x2 with relief of his chest pain. Pt called his friend to bring him some pain medication because pt wasn't able to fill his pain medication after being discharged from the hospital.  Pt's color is pale, no diaphoresis noted

## 2013-05-28 NOTE — ED Notes (Addendum)
PT. REPORTS MID CHEST PAIN WITH SOB ONSET THIS EVENING , NON RADIATING , DENIES NAUSEA OR DIAPHORESIS , PT. TOOK 2 NTG SL AND 1 PERCOCET PTA WITH RELIEF , HISTORY OF CAD / CABG .

## 2013-05-28 NOTE — Progress Notes (Addendum)
[facility] PROGRESS NOTE  [name] ZOX:[id number] DOB: 1946-05-09 DOA: 05/28/2013 PCP: Nicki Reaper, NP I have seen and examined pt who is a 67 yo admitted this am by Dr Conley Rolls with known CAD, s/p CABG and cardiac stent placement, hx of afib on Eliquis, recent left vascular bypass (Dr Hart Rochester- 05/21/13 and dc'ed 8/20), HTN, hyperlipidemia, COPD, c/o substernal chest pain at rest>> troponin neg x1, await VQ ,continue current management plan as per Dr Conley Rolls and follow. He is chest pain free at this time.   [name]  [facility] Pager [phone]. If 7PM-7AM, please contact night-coverage at [email or web address], password [facility] 05/28/2013, 11:21 AM  LOS: 0 days

## 2013-05-28 NOTE — Care Management Note (Unsigned)
    Page 1 of 1   05/28/2013     3:43:21 PM   CARE MANAGEMENT NOTE 05/28/2013  Patient:  [name], [name]   Account Number:  [id number]  Date Initiated:  05/28/2013  Documentation initiated by:  Antrell Tipler  Subjective/Objective Assessment:   PT ADM ON 05/28/13 WITH CHEST PAIN; DISCHARGED FROM THIS FACILITY ON 05/27/13 S/P FEM POP BYPASS GRAFT.  PTA, PT INDEPENDENT, AND LIVES WITH SPOUSE.     Action/Plan:   WILL FOLLOW FOR HOME NEEDS AS PT PROGRESSES.  PT ACTIVE WITH [facility] FOR HH NEEDS. WILL NOTIFY [facility] OF ADMISSION.   Anticipated DC Date:  05/30/2013   Anticipated DC Plan:  HOME W HOME HEALTH SERVICES      DC Planning Services  CM consult      [facility] Choice  Resumption Of Svcs/PTA Provider   Choice offered to / List presented to:             Status of service:  In process, will continue to follow Medicare Important Message given?   (If response is "NO", the following Medicare IM given date fields will be blank) Date Medicare IM given:   Date Additional Medicare IM given:    Discharge Disposition:    Per UR Regulation:  Reviewed for med. necessity/level of care/duration of stay  If discussed at Long Length of Stay Meetings, dates discussed:    Comments:

## 2013-05-28 NOTE — ED Notes (Signed)
Pt returned from XR placed back on monitor, nad, rr even and unlabored pt is c/o pain to his abd and incision site to Left leg

## 2013-05-29 ENCOUNTER — Encounter (HOSPITAL_COMMUNITY): Payer: Self-pay | Admitting: Physician Assistant

## 2013-05-29 DIAGNOSIS — I369 Nonrheumatic tricuspid valve disorder, unspecified: Secondary | ICD-10-CM

## 2013-05-29 DIAGNOSIS — I5023 Acute on chronic systolic (congestive) heart failure: Secondary | ICD-10-CM

## 2013-05-29 DIAGNOSIS — I4891 Unspecified atrial fibrillation: Secondary | ICD-10-CM

## 2013-05-29 DIAGNOSIS — I214 Non-ST elevation (NSTEMI) myocardial infarction: Secondary | ICD-10-CM

## 2013-05-29 DIAGNOSIS — I2510 Atherosclerotic heart disease of native coronary artery without angina pectoris: Secondary | ICD-10-CM

## 2013-05-29 MED ORDER — ENALAPRIL MALEATE 5 MG PO TABS
5.0000 mg | ORAL_TABLET | Freq: Two times a day (BID) | ORAL | Status: DC
  Administered 2013-05-29 – 2013-05-30 (×3): 5 mg via ORAL
  Filled 2013-05-29 (×4): qty 1

## 2013-05-29 MED ORDER — AMIODARONE IV BOLUS ONLY 150 MG/100ML
150.0000 mg | Freq: Once | INTRAVENOUS | Status: AC
  Administered 2013-05-29: 150 mg via INTRAVENOUS
  Filled 2013-05-29: qty 100

## 2013-05-29 MED ORDER — POTASSIUM CHLORIDE CRYS ER 20 MEQ PO TBCR
30.0000 meq | EXTENDED_RELEASE_TABLET | Freq: Two times a day (BID) | ORAL | Status: AC
  Administered 2013-05-29 (×2): 30 meq via ORAL
  Filled 2013-05-29 (×2): qty 1

## 2013-05-29 MED ORDER — FUROSEMIDE 10 MG/ML IJ SOLN
40.0000 mg | Freq: Once | INTRAMUSCULAR | Status: AC
  Administered 2013-05-29: 40 mg via INTRAVENOUS
  Filled 2013-05-29: qty 4

## 2013-05-29 MED ORDER — AMIODARONE HCL 200 MG PO TABS
200.0000 mg | ORAL_TABLET | Freq: Two times a day (BID) | ORAL | Status: DC
  Administered 2013-05-29 – 2013-05-30 (×2): 200 mg via ORAL
  Filled 2013-05-29 (×3): qty 1
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
## 2013-05-29 NOTE — Progress Notes (Addendum)
[facility] PROGRESS NOTE  [name] NFA:[id number] DOB: September 03, 1946 DOA: 05/28/2013 PCP: Nicki Reaper, NP  Assessment/Plan:  .NSTEMI/ Elevated Troponins -continue B blocker, pt also on plavix -follow up on echo -cards consult for further recs -he is CP free .PAF -S/P cardizem in ED, converted to NSR and remains sinus -continue amio, and coreg and apixaban . CAD (coronary artery disease) -as above -now CP free, follow . Acute on Chronic systolic CHF (congestive heart failure) -continue lasix, follow echo -precipitating factors likely  #1 and 2  . Hyperlipidemia -continue outpt meds . PVD (peripheral vascular disease) -s/p recent fem-pop . AKI (acute kidney injury) -contrast induced last admit -improved, follow .Hyponatremia -improved with diuresis -follow and recheck  Code Status: FULL Family Communication: wife at bedside Disposition Plan: to home when medically ready   Consultants:  CARDS  Procedures:  ECHO  Antibiotics:  NONE  HPI/Subjective: Denies chest pain, denies SOB  Objective: Filed Vitals:   05/29/13 0616  BP:   Pulse: 80  Temp:   Resp:     Intake/Output Summary (Last 24 hours) at 05/29/13 1001 Last data filed at 05/29/13 0700  Gross per 24 hour  Intake   1080 ml  Output    425 ml  Net    655 ml   Filed Weights   05/28/13 0453 05/29/13 0527  Weight: 88.451 kg (195 lb) 88.451 kg (195 lb)    Exam:  General: alert & oriented x 3 In NAD Cardiovascular: RRR, nl S1 s2 Respiratory: decreased air entry, few basilar crackles Abdomen: soft +BS NT/ND, no masses palpable Extremities: No cyanosis and no edema   Data Reviewed: Basic Metabolic Panel:  Recent Labs Lab 05/24/13 0940 05/25/13 0450 05/26/13 0500 05/27/13 0500 05/28/13 0103  NA 124* 126* 127* 130* 131*  K 4.8 4.4 4.7 5.3* 3.4*  CL 91* 95* 95* 98 95*  CO2 22 21 21 22 22   GLUCOSE 188* 111* 118* 133* 163*  BUN 50* 60* 61* 46* 33*  CREATININE 2.36* 2.60*  2.26* 1.72* 1.38*  CALCIUM 8.9 8.4 8.2* 8.8 8.6  PHOS  --   --  3.6 3.0  --    Liver Function Tests:  Recent Labs Lab 05/26/13 0500 05/27/13 0500  ALBUMIN 2.9* 3.2*   No results found for this basename: LIPASE, AMYLASE,  in the last 168 hours No results found for this basename: AMMONIA,  in the last 168 hours CBC:  Recent Labs Lab 05/24/13 0940 05/25/13 0450 05/26/13 0500 05/28/13 0103  WBC 11.6* 10.0 9.0 12.1*  HGB 10.5* 9.1* 9.0* 10.4*  HCT 29.7* 26.3* 25.9* 28.7*  MCV 90.8 91.0 91.5 89.1  PLT 181 159 168 219   Cardiac Enzymes:  Recent Labs Lab 05/28/13 0449 05/28/13 1025 05/28/13 1714  TROPONINI <0.30 0.40* 0.39*   BNP (last 3 results)  Recent Labs  03/21/13 1916 05/28/13 0103  PROBNP 2099.0* 7588.0*   CBG: No results found for this basename: GLUCAP,  in the last 168 hours  No results found for this or any previous visit (from the past 240 hour(s)).   Studies: Dg Chest 2 View  05/28/2013   *RADIOLOGY REPORT*  Clinical Data: Very artery disease.  CHEST - 2 VIEW  Comparison: Chest x-ray dated 05/21/2013  Findings: The patient has bilateral perihilar upper lobe pulmonary infiltrates.  The heart size and vascularity are normal.  Evidence of prior CABG.  Tiny left pleural effusion.  No acute osseous abnormality.  IMPRESSION: Bilateral perihilar upper lobe infiltrates.  This  could represent atypical pulmonary edema but it could also represent bilateral upper lobe pneumonia.  Tiny left effusion.   Original Report Authenticated By: Francene Boyers, M.D.   Nm Pulmonary Perf And Vent  05/28/2013   *RADIOLOGY REPORT*  Clinical Data: Chest pain  NM PULMONARY VENTILATION AND PERFUSION SCAN views:  Anterior, posterior, left lateral, right lateral, RPO, LPO, RAO, LAO - ventilation and perfusion  Radiopharmaceutical: Technetium 16m DTPA - ventilation; technetium 12m macroaggregated albumin - perfusion  Dose:  40.0 mCi - ventilation; 6.0 mCi - perfusion  Route of  administration:  Inhalation - ventilation; intravenous - perfusion  Comparison: Chest radiograph May 28, 2013  Findings: Ventilation study shows decreased uptake in the left upper lobe compared the right.  There are patchy ventilation defects in both lower lobes, no of which are segmental.  On the perfusion study, there are a few scattered subsegmental defects.  There are no larger perfusion defects.  There are no perfusion defects without matching ventilation defects; indeed, perfusion defects are smaller than ventilation defects.  IMPRESSION: No significant ventilation / perfusion mismatch. Low probability of pulmonary embolus.   Original Report Authenticated By: Bretta Bang, M.D.    Scheduled Meds: . amiodarone  200 mg Oral Daily  . amLODipine  5 mg Oral Daily  . apixaban  5 mg Oral BID  . atorvastatin  80 mg Oral QPM  . carvedilol  12.5 mg Oral BID WC  . clopidogrel  75 mg Oral Q breakfast  . docusate sodium  100 mg Oral BID  . furosemide  40 mg Oral Daily  . isosorbide mononitrate  30 mg Oral Daily  . sodium chloride  3 mL Intravenous Q12H  . sodium chloride  3 mL Intravenous Q12H  . tamsulosin  0.4 mg Oral Daily   Continuous Infusions:   Principal Problem:   Chest pain, atypical Active Problems:   CAD (coronary artery disease)   Hyperlipidemia   PVD (peripheral vascular disease)   Peripheral vascular disease, unspecified   Chronic systolic CHF (congestive heart failure)   AKI (acute kidney injury)   A-fib    Time spent:    [facility]  [facility] Pager [phone]. If 7PM-7AM, please contact night-coverage at [email or web address], password [facility] 05/29/2013, 10:01 AM  LOS: 1 day

## 2013-05-29 NOTE — Progress Notes (Signed)
  Echocardiogram 2D Echocardiogram has been performed.  [name] 05/29/2013, 10:05 AM

## 2013-05-29 NOTE — Clinical Documentation Improvement (Signed)
THIS DOCUMENT IS NOT A PERMANENT PART OF THE MEDICAL RECORD  Please update your documentation with the medical record to reflect your response to this query. If you need help knowing how to do this please call [phone].  05/29/13   Dear Dr. Suanne Marker / Associates,  In a better effort to capture your patient's severity of illness, reflect appropriate length of stay and utilization of resources, a review of the patient medical record has revealed the following indicators.    Based on your clinical judgment, please clarify and document in a progress note and/or discharge summary the clinical condition associated with the following supporting information:   Possible Clinical Conditions?  ___STEMI   ___Non-STEMI  ___Other Condition  ___Cannot Clinically Determine     Risk Factors: Patient presented to the ER with substernal chest pain at rest, was pain free after taking 2nd NTG, per 8/21 H&P.    Diagnostics: Troponin I: 8/21: <0.30;  0.40;  0.39   You may use possible, probable, or suspect with inpatient documentation. possible, probable, suspected diagnoses MUST be documented at the time of discharge  Reviewed: additional documentation in the medical record  Thank You,  Marciano Sequin,  Clinical Documentation Specialist: [phone] Health Information Management [place]

## 2013-05-30 ENCOUNTER — Inpatient Hospital Stay (HOSPITAL_COMMUNITY): Payer: Medicare Other

## 2013-05-30 DIAGNOSIS — R079 Chest pain, unspecified: Secondary | ICD-10-CM

## 2013-05-30 DIAGNOSIS — E871 Hypo-osmolality and hyponatremia: Secondary | ICD-10-CM

## 2013-05-30 DIAGNOSIS — I10 Essential (primary) hypertension: Secondary | ICD-10-CM

## 2013-05-30 DIAGNOSIS — E785 Hyperlipidemia, unspecified: Secondary | ICD-10-CM

## 2013-05-30 DIAGNOSIS — N179 Acute kidney failure, unspecified: Secondary | ICD-10-CM

## 2013-05-30 LAB — CBC
HCT: 27.3 % — ABNORMAL LOW (ref 39.0–52.0)
MCH: 31.7 pg (ref 26.0–34.0)
MCV: 90.1 fL (ref 78.0–100.0)
Platelets: 220 10*3/uL (ref 150–400)
RDW: 13.8 % (ref 11.5–15.5)

## 2013-05-30 LAB — BASIC METABOLIC PANEL
CO2: 29 mEq/L (ref 19–32)
Calcium: 8.3 mg/dL — ABNORMAL LOW (ref 8.4–10.5)
Chloride: 92 mEq/L — ABNORMAL LOW (ref 96–112)
Creatinine, Ser: 1.3 mg/dL (ref 0.50–1.35)
Glucose, Bld: 91 mg/dL (ref 70–99)

## 2013-05-30 MED ORDER — AMIODARONE HCL 200 MG PO TABS: 200.0000 mg | ORAL_TABLET | Freq: Two times a day (BID) | ORAL | Status: DC

## 2013-05-30 MED ORDER — ASPIRIN 81 MG PO TABS: 81.0000 mg | ORAL_TABLET | Freq: Every day | ORAL | Status: AC

## 2013-05-30 MED ORDER — ASPIRIN 81 MG PO CHEW
81.0000 mg | CHEWABLE_TABLET | Freq: Once | ORAL | Status: AC
  Administered 2013-05-30: 81 mg via ORAL
  Filled 2013-05-30: qty 1

## 2013-05-30 MED ORDER — ENALAPRIL MALEATE 5 MG PO TABS: 5.0000 mg | ORAL_TABLET | Freq: Two times a day (BID) | ORAL | Status: DC

## 2013-05-30 MED ORDER — FUROSEMIDE 10 MG/ML IJ SOLN
40.0000 mg | Freq: Once | INTRAMUSCULAR | Status: AC
  Administered 2013-05-30: 40 mg via INTRAVENOUS
  Filled 2013-05-30: qty 4

## 2013-05-30 NOTE — Progress Notes (Signed)
SUBJECTIVE: [name] is feeling well today, and denies chest pain and shortness of breath. He says, "I feel much better".   Filed Vitals:   05/29/13 1338 05/29/13 1803 05/29/13 1939 05/30/13 0502  BP: 143/50 165/74 147/51 138/55  Pulse: 61 67 59 57  Temp: 98.1 F (36.7 C)  99 F (37.2 C) 98.4 F (36.9 C)  TempSrc: Oral  Oral Oral  Resp: 16  18 18   Height:      Weight:    193 lb 2 oz (87.6 kg)  SpO2: 95%  91% 90%    Intake/Output Summary (Last 24 hours) at 05/30/13 0952 Last data filed at 05/30/13 0753  Gross per 24 hour  Intake    480 ml  Output   1900 ml  Net  -1420 ml    PHYSICAL EXAM General: NAD Neck: No JVD, no thyromegaly or thyroid nodule.  Lungs: Faint rales heard in bilateral upper lung fields, with normal respiratory effort. CV: Nondisplaced PMI.  Heart regular S1/S2, no S3/S4, no murmur.  Trace peripheral edema in legs, bilaterally.  No carotid bruit.  Normal pedal pulses.  Abdomen: Soft, nontender, no hepatosplenomegaly, no distention.  Neurologic: Alert and oriented x 3.  Psych: Normal affect. Extremities: No clubbing or cyanosis.   TELEMETRY: Reviewed telemetry pt in sinus bradycardia, 57 bpm range.  LABS: Basic Metabolic Panel:  Recent Labs  16/10/96 0103 05/30/13 0400  NA 131* 131*  K 3.4* 4.0  CL 95* 92*  CO2 22 29  GLUCOSE 163* 91  BUN 33* 25*  CREATININE 1.38* 1.30  CALCIUM 8.6 8.3*   Liver Function Tests: No results found for this basename: AST, ALT, ALKPHOS, BILITOT, PROT, ALBUMIN,  in the last 72 hours No results found for this basename: LIPASE, AMYLASE,  in the last 72 hours CBC:  Recent Labs  05/28/13 0103 05/30/13 0400  WBC 12.1* 10.9*  HGB 10.4* 9.6*  HCT 28.7* 27.3*  MCV 89.1 90.1  PLT 219 220   Cardiac Enzymes:  Recent Labs  05/28/13 0449 05/28/13 1025 05/28/13 1714  TROPONINI <0.30 0.40* 0.39*   BNP: No components found with this basename: POCBNP,  D-Dimer: No results found for this basename: DDIMER,   in the last 72 hours Hemoglobin A1C: No results found for this basename: HGBA1C,  in the last 72 hours Fasting Lipid Panel: No results found for this basename: CHOL, HDL, LDLCALC, TRIG, CHOLHDL, LDLDIRECT,  in the last 72 hours Thyroid Function Tests:  Recent Labs  05/28/13 0500  TSH 3.396   Anemia Panel: No results found for this basename: VITAMINB12, FOLATE, FERRITIN, TIBC, IRON, RETICCTPCT,  in the last 72 hours  RADIOLOGY: Dg Chest 2 View  05/28/2013   *RADIOLOGY REPORT*  Clinical Data: Very artery disease.  CHEST - 2 VIEW  Comparison: Chest x-ray dated 05/21/2013  Findings: The patient has bilateral perihilar upper lobe pulmonary infiltrates.  The heart size and vascularity are normal.  Evidence of prior CABG.  Tiny left pleural effusion.  No acute osseous abnormality.  IMPRESSION: Bilateral perihilar upper lobe infiltrates.  This could represent atypical pulmonary edema but it could also represent bilateral upper lobe pneumonia.  Tiny left effusion.   Original Report Authenticated By: Francene Boyers, M.D.   Dg Chest 2 View  05/21/2013   *RADIOLOGY REPORT*  Clinical Data: Preop.  Leg surgery.  History of effusions.  CHEST - 2 VIEW  Comparison: 03/25/2013.  Findings: No pleural effusions, edema, infiltrate, pneumothorax.  There are bilateral dense for  size nodules, compatible with calcified pulmonary nodules. This measure 7 mm on the right and 5 mm on the left.  Prior CABG. There may be ectasia of the proximal descending aorta, unchanged dating back to 2004.  Pulmonary hyperinflation.  IMPRESSION:  1.  No evidence of acute cardiopulmonary disease. No pleural effusion.  2.  Hyperinflation suggesting COPD. 3.  Prior CABG.   Original Report Authenticated By: Tiburcio Pea   US Renal  05/25/2013   *RADIOLOGY REPORT*  Clinical Data: Acute renal failure.  RENAL/URINARY TRACT ULTRASOUND COMPLETE  Comparison:  None.  Findings:  Right Kidney:  13.6 cm in length.  The renal cortex is quite thin. No  hydronephrosis.  2.7 cm cyst in the midportion of the kidney.  Left Kidney:  14.0 cm in length.  3.3 cm cyst on the upper pole. No hydronephrosis.  Bladder:  Normal.  Ureteral jets are not identified.  IMPRESSION:  1.  No evidence of renal obstruction. 2.  The renal cortex of the right kidney is diffusely thinned. 3.  Bilateral renal cysts.   Original Report Authenticated By: Francene Boyers, M.D.   Nm Pulmonary Perf And Vent  05/28/2013   *RADIOLOGY REPORT*  Clinical Data: Chest pain  NM PULMONARY VENTILATION AND PERFUSION SCAN views:  Anterior, posterior, left lateral, right lateral, RPO, LPO, RAO, LAO - ventilation and perfusion  Radiopharmaceutical: Technetium 24m DTPA - ventilation; technetium 33m macroaggregated albumin - perfusion  Dose:  40.0 mCi - ventilation; 6.0 mCi - perfusion  Route of administration:  Inhalation - ventilation; intravenous - perfusion  Comparison: Chest radiograph May 28, 2013  Findings: Ventilation study shows decreased uptake in the left upper lobe compared the right.  There are patchy ventilation defects in both lower lobes, no of which are segmental.  On the perfusion study, there are a few scattered subsegmental defects.  There are no larger perfusion defects.  There are no perfusion defects without matching ventilation defects; indeed, perfusion defects are smaller than ventilation defects.  IMPRESSION: No significant ventilation / perfusion mismatch. Low probability of pulmonary embolus.   Original Report Authenticated By: Bretta Bang, M.D.   Dg Chest Port 1 View  05/30/2013   *RADIOLOGY REPORT*  Clinical Data: Follow up infiltrates  PORTABLE CHEST - 1 VIEW  Comparison: 05/28/2013  Findings: Perihilar infiltrates seen previously have mildly improved.  There are no new lung opacities.  No pleural effusion or pneumothorax is seen.  Changes from CABG surgery are stable.  The cardiac silhouette is normal in size and configuration.  IMPRESSION: Improved perihilar  infiltrates.  No new lung infiltrates.   Original Report Authenticated By: Amie Portland, M.D.      ASSESSMENT AND PLAN: 1. NSTEMI, likely secondary to demand ischemia from rapid atrial fib,  2. CAD (coronary artery disease) s/p CABG  --recent cath 6/14 with no revascularization options  3. A/C systolic HF  4. PAF with RVR - now back in NSR  5. PVD (peripheral vascular disease)  6. Hypokalemia  7. Recent AKI (acute kidney injury)  8. Hyperlipidemia  9. Hyponatremia   PLAN:  1. Non-STEMI: His initial enzymes were negative, then trended up slightly and peaked at 0.4 with subsequent value of 0.39. He is currently asymptomatic This is likely secondary to demand ischemia from his rapid atrial fibrillation. He was initially in atrial fibrillation with rapid ventricular response, but his rate improved on medical therapy and he has since converted back to sinus rhythm. No reported targets for revascularization, so medical management only. Amiodarone  increased to 200 mg twice daily and I've restarted ASA 81 mg daily and stopped Plavix, due to his being on Apixaban as well.  2. Acute on chronic systolic CHF: He also had some volume overload on admission and received a dose of IV Lasix with improvement. He has some renal insufficiency but his BUN and creatinine were higher during his last admission at 60/2.6, and are now down to 25/1.3 (1.38 yesterday). He developed hyponatremia during his last admission and this is now stable for past 2 days at 131. Will give an additional dose of IV Lasix 40 mg now and reassess in a.m, and will repeat BMP and CBC. Given his lack of fevers and present symptoms, doubt there is superimposed pneumonia, but will continue to monitor. Enalapril restarted yesterday at 5 mg bid (had been on 10 mg bid previously, but was stopped due to acute kidney injury).  3. Rapid atrial fibrillation, now in NSR: as stated previously, now on Amiodarone 200 mg bid. On Apixaban for  anticoagulation, so Plavix d/c today.    Prentice Docker, M.D., F.A.C.C.

## 2013-05-30 NOTE — Discharge Summary (Signed)
Physician Discharge Summary  [name]:[id number] DOB: [birth year] DOA: 05/28/2013  PCP: Nicki Reaper, NP  Admit date: 05/28/2013 Discharge date: 05/30/2013  Time spent: <30 minutes  Recommendations for Outpatient Follow-up:  Follow-up Information   Follow up with Nicki Reaper, NP. (in 1week, call for appt upon discharge)    Specialty:  Internal Medicine   Contact information:   [address] [place] [address] [phone]       Follow up with Elyn Aquas., MD. (IN 1-2WEEKS, CALL FOR appt upon discharge)    Specialty:  Cardiology   Contact information:   [address] [place] [address] [phone]       Discharge Diagnoses:  Principal Problem:   Chest pain, atypical Active Problems:   CAD (coronary artery disease)   Hyperlipidemia   PVD (peripheral vascular disease)   Peripheral vascular disease, unspecified   Chronic systolic CHF (congestive heart failure)   AKI (acute kidney injury)   A-fib   NSTEMI (non-ST elevated myocardial infarction)   Acute on chronic systolic heart failure   Discharge Condition: improved/stable  Diet recommendation: heart healthy  Filed Weights   05/28/13 0453 05/29/13 0527 05/30/13 0502  Weight: 88.451 kg (195 lb) 88.451 kg (195 lb) 87.6 kg (193 lb 2 oz)    History of present illness:  [name] is an 67 y.o. male with known CAD, s/p CABG and cardiac stent placement, hx of afib on Eliquis, recent left vascular bypass (Dr Hart [place]- 05/21/13), HTN, hyperlipidemia, COPD, presents to the ER with substernal chest pain at rest. He didn;t have pleuritic chest pain. He did have some shortness of breath, but no nausea or vomting. He denied missing any of his Eliquis. After taking the 2nd NTG, he was pain free on the way to the ER. His shortness breath was better as well. Evalaution in the ER included negatvie troponin and EKG with ST segment depression and T wave inversion, but not different than prior EKG.  He had no coughs, fever or chills, and his CXR showed atypical edema, can't exclude infiltrate. His WBC was 13K. He also had AKI after the contrast venography and was under the care of nephrology. His Cr is 1.7 today. Hospitalist was asked to admit him for CP.   Hospital Course:  .NSTEMI/ Elevated Troponins  -continue B blocker, pt also on plavix  -echo was done and showed EF of 35% with severe hypokinesis -cards consult for further recs and he was seen by [facility] cards and the impression was that this is likely secondary to demand ischemia from his rapid atrial fibrillation -it was noted that he was on apixaban and plavix and cards dc/ed the plavix and he was started on ASA 81mg  and is to follow up with [facility] outpt and his PCP .PAF  -S/P cardizem in ED, converted to NSR and remains sinus  -amio was continued and cards followed up and changed to to BID, He was maintained on coreg and apixaban and is to follow up with cards outpt as already mentioned. Marland Kitchen CAD (coronary artery disease)  -as above -now CP free, follow . Acute on Chronic systolic CHF (congestive heart failure)  -he was diuresed with lasix, echo  Was done and results as above. -precipitating factors likely #1 and 2  . Hyperlipidemia -continue outpt meds . PVD (peripheral vascular disease)  -s/p recent fem-pop . AKI (acute kidney injury)  -contrast induced last admit  -improved, follow up outpt .Hyponatremia  -improved with  diuresis  -follow up with PCP.   Procedures:  echo Study Conclusions  - Left ventricle: Severe hypokinesis mid inferolateral, base inferior, mid inferior, base/mid inferoseptal, base/mid anteroseptal. Other walls move with mild hypo. The cavity size was mildly dilated. The estimated ejection fraction was 35%. - Mitral valve: Some leaflet thickening. I do not know if there is a mitral ring. If not, there is moderate MAC. - Left atrium: The atrium was moderately dilated. - Right ventricle: The  cavity size was mildly dilated. Systolic function was mildly reduced. - Right atrium: The atrium was mildly dilated. - Pulmonary arteries: PA peak pressure: 39mm Hg (S).   Consultations:  cardiology  Discharge Exam: Filed Vitals:   05/30/13 0502  BP: 138/55  Pulse: 57  Temp: 98.4 F (36.9 C)  Resp: 18   Exam:  General: alert & oriented x 3 In NAD  Cardiovascular: RRR, nl S1 s2  Respiratory: decreased air entry, few basilar crackles  Abdomen: soft +BS NT/ND, no masses palpable  Extremities: No cyanosis and no edema   Discharge Instructions  Discharge Orders   Future Appointments Provider Department Dept Phone   06/09/2013 9:45 AM Pryor Ochoa, MD [facility] [phone]   Future Orders Complete By Expires   Diet - low sodium heart healthy  As directed    Increase activity slowly  As directed        Medication List    STOP taking these medications       BC HEADACHE POWDER PO     clopidogrel 75 MG tablet  Commonly known as:  PLAVIX      TAKE these medications       albuterol 108 (90 BASE) MCG/ACT inhaler  Commonly known as:  PROVENTIL HFA;VENTOLIN HFA  Inhale 2 puffs into the lungs every 6 (six) hours as needed for wheezing or shortness of breath.     amiodarone 200 MG tablet  Commonly known as:  PACERONE  Take 1 tablet (200 mg total) by mouth 2 (two) times daily.     amLODipine 5 MG tablet  Commonly known as:  NORVASC  Take 1 tablet (5 mg total) by mouth daily.     apixaban 5 MG Tabs tablet  Commonly known as:  ELIQUIS  Take 1 tablet (5 mg total) by mouth 2 (two) times daily.     aspirin 81 MG tablet  Take 1 tablet (81 mg total) by mouth daily.     atorvastatin 80 MG tablet  Commonly known as:  LIPITOR  Take 1 tablet (80 mg total) by mouth every evening.     carvedilol 12.5 MG tablet  Commonly known as:  COREG  Take 1 tablet (12.5 mg total) by mouth 2 (two) times daily with a meal.     clobetasol 0.05 % topical  foam  Commonly known as:  OLUX  Apply topically 2 (two) times daily.     enalapril 5 MG tablet  Commonly known as:  VASOTEC  Take 1 tablet (5 mg total) by mouth 2 (two) times daily.     furosemide 40 MG tablet  Commonly known as:  LASIX  Take 1 tablet (40 mg total) by mouth daily.     isosorbide mononitrate 30 MG 24 hr tablet  Commonly known as:  IMDUR  Take 1 tablet (30 mg total) by mouth daily.     loratadine 10 MG tablet  Commonly known as:  CLARITIN  Take 10 mg by mouth every evening.  metoCLOPramide 5 MG tablet  Commonly known as:  REGLAN  Take 1 tablet (5 mg total) by mouth 4 (four) times daily.     nitroGLYCERIN 0.4 MG SL tablet  Commonly known as:  NITROSTAT  Place 1 tablet (0.4 mg total) under the tongue every 5 (five) minutes as needed for chest pain.     oxyCODONE 5 MG immediate release tablet  Commonly known as:  ROXICODONE  Take 1 tablet (5 mg total) by mouth every 6 (six) hours as needed for pain.     tamsulosin 0.4 MG Caps capsule  Commonly known as:  FLOMAX  Take 1 capsule (0.4 mg total) by mouth daily.       No Known Allergies     Follow-up Information   Follow up with BAITY, REGINA, NP. (in 1week, call for appt upon discharge)    Specialty:  Internal Medicine   Contact information:   [address] [place] [address] [phone]       Follow up with Elyn Aquas., MD. (IN 1-2WEEKS, CALL FOR appt upon discharge)    Specialty:  Cardiology   Contact information:   [address] [place] [address] [phone]        The results of significant diagnostics from this hospitalization (including imaging, microbiology, ancillary and laboratory) are listed below for reference.    Significant Diagnostic Studies: Dg Chest 2 View  05/28/2013   *RADIOLOGY REPORT*  Clinical Data: Very artery disease.  CHEST - 2 VIEW  Comparison: Chest x-ray dated 05/21/2013  Findings: The patient has bilateral perihilar upper lobe  pulmonary infiltrates.  The heart size and vascularity are normal.  Evidence of prior CABG.  Tiny left pleural effusion.  No acute osseous abnormality.  IMPRESSION: Bilateral perihilar upper lobe infiltrates.  This could represent atypical pulmonary edema but it could also represent bilateral upper lobe pneumonia.  Tiny left effusion.   Original Report Authenticated By: Francene Boyers, M.D.   Dg Chest 2 View  05/21/2013   *RADIOLOGY REPORT*  Clinical Data: Preop.  Leg surgery.  History of effusions.  CHEST - 2 VIEW  Comparison: 03/25/2013.  Findings: No pleural effusions, edema, infiltrate, pneumothorax.  There are bilateral dense for size nodules, compatible with calcified pulmonary nodules. This measure 7 mm on the right and 5 mm on the left.  Prior CABG. There may be ectasia of the proximal descending aorta, unchanged dating back to 2004.  Pulmonary hyperinflation.  IMPRESSION:  1.  No evidence of acute cardiopulmonary disease. No pleural effusion.  2.  Hyperinflation suggesting COPD. 3.  Prior CABG.   Original Report Authenticated By: Tiburcio Pea   US Renal  05/25/2013   *RADIOLOGY REPORT*  Clinical Data: Acute renal failure.  RENAL/URINARY TRACT ULTRASOUND COMPLETE  Comparison:  None.  Findings:  Right Kidney:  13.6 cm in length.  The renal cortex is quite thin. No hydronephrosis.  2.7 cm cyst in the midportion of the kidney.  Left Kidney:  14.0 cm in length.  3.3 cm cyst on the upper pole. No hydronephrosis.  Bladder:  Normal.  Ureteral jets are not identified.  IMPRESSION:  1.  No evidence of renal obstruction. 2.  The renal cortex of the right kidney is diffusely thinned. 3.  Bilateral renal cysts.   Original Report Authenticated By: Francene Boyers, M.D.   Nm Pulmonary Perf And Vent  05/28/2013   *RADIOLOGY REPORT*  Clinical Data: Chest pain  NM PULMONARY VENTILATION AND PERFUSION SCAN views:  Anterior, posterior, left lateral, right lateral, RPO, LPO, RAO, LAO - ventilation and perfusion   Radiopharmaceutical: Technetium 31m DTPA - ventilation; technetium 67m macroaggregated albumin - perfusion  Dose:  40.0 mCi - ventilation; 6.0 mCi - perfusion  Route of administration:  Inhalation - ventilation; intravenous - perfusion  Comparison: Chest radiograph May 28, 2013  Findings: Ventilation study shows decreased uptake in the left upper lobe compared the right.  There are patchy ventilation defects in both lower lobes, no of which are segmental.  On the perfusion study, there are a few scattered subsegmental defects.  There are no larger perfusion defects.  There are no perfusion defects without matching ventilation defects; indeed, perfusion defects are smaller than ventilation defects.  IMPRESSION: No significant ventilation / perfusion mismatch. Low probability of pulmonary embolus.   Original Report Authenticated By: Bretta Bang, M.D.   Dg Chest Port 1 View  05/30/2013   *RADIOLOGY REPORT*  Clinical Data: Follow up infiltrates  PORTABLE CHEST - 1 VIEW  Comparison: 05/28/2013  Findings: Perihilar infiltrates seen previously have mildly improved.  There are no new lung opacities.  No pleural effusion or pneumothorax is seen.  Changes from CABG surgery are stable.  The cardiac silhouette is normal in size and configuration.  IMPRESSION: Improved perihilar infiltrates.  No new lung infiltrates.   Original Report Authenticated By: Amie Portland, M.D.    Microbiology: No results found for this or any previous visit (from the past 240 hour(s)).   Labs: Basic Metabolic Panel:  Recent Labs Lab 05/25/13 0450 05/26/13 0500 05/27/13 0500 05/28/13 0103 05/30/13 0400  NA 126* 127* 130* 131* 131*  K 4.4 4.7 5.3* 3.4* 4.0  CL 95* 95* 98 95* 92*  CO2 21 21 22 22 29   GLUCOSE 111* 118* 133* 163* 91  BUN 60* 61* 46* 33* 25*  CREATININE 2.60* 2.26* 1.72* 1.38* 1.30  CALCIUM 8.4 8.2* 8.8 8.6 8.3*  PHOS  --  3.6 3.0  --   --    Liver Function Tests:  Recent Labs Lab 05/26/13 0500  05/27/13 0500  ALBUMIN 2.9* 3.2*   No results found for this basename: LIPASE, AMYLASE,  in the last 168 hours No results found for this basename: AMMONIA,  in the last 168 hours CBC:  Recent Labs Lab 05/24/13 0940 05/25/13 0450 05/26/13 0500 05/28/13 0103 05/30/13 0400  WBC 11.6* 10.0 9.0 12.1* 10.9*  HGB 10.5* 9.1* 9.0* 10.4* 9.6*  HCT 29.7* 26.3* 25.9* 28.7* 27.3*  MCV 90.8 91.0 91.5 89.1 90.1  PLT 181 159 168 219 220   Cardiac Enzymes:  Recent Labs Lab 05/28/13 0449 05/28/13 1025 05/28/13 1714  TROPONINI <0.30 0.40* 0.39*   BNP: BNP (last 3 results)  Recent Labs  03/21/13 1916 05/28/13 0103  PROBNP 2099.0* 7588.0*   CBG: No results found for this basename: GLUCAP,  in the last 168 hours     Signed:  Amarii Bordas C  [facility] 05/30/2013, 11:58 AM

## 2013-06-01 ENCOUNTER — Encounter: Payer: Self-pay | Admitting: Cardiology

## 2013-06-01 ENCOUNTER — Inpatient Hospital Stay (HOSPITAL_COMMUNITY): Admission: AD | Admit: 2013-06-01 | Payer: Self-pay | Source: Other Acute Inpatient Hospital | Admitting: Cardiology

## 2013-06-01 DIAGNOSIS — F329 Major depressive disorder, single episode, unspecified: Secondary | ICD-10-CM | POA: Insufficient documentation

## 2013-06-01 DIAGNOSIS — Z9229 Personal history of other drug therapy: Secondary | ICD-10-CM | POA: Insufficient documentation

## 2013-06-01 DIAGNOSIS — I739 Peripheral vascular disease, unspecified: Secondary | ICD-10-CM | POA: Insufficient documentation

## 2013-06-01 DIAGNOSIS — I255 Ischemic cardiomyopathy: Secondary | ICD-10-CM | POA: Insufficient documentation

## 2013-06-01 DIAGNOSIS — Z720 Tobacco use: Secondary | ICD-10-CM | POA: Insufficient documentation

## 2013-06-01 DIAGNOSIS — N182 Chronic kidney disease, stage 2 (mild): Secondary | ICD-10-CM | POA: Insufficient documentation

## 2013-06-02 ENCOUNTER — Encounter: Payer: Self-pay | Admitting: Cardiology

## 2013-06-02 NOTE — Progress Notes (Signed)
   Yesterday I received a phone call from the physicians in the emergency room at [facility].. the patient was there with an episode of what may have been presyncope or syncope. He wanted his care at [facility] with his regular doctors. I made arrangements with [facility] for the patient to be transported to [place]. Later I learned from [facility] that the patient had signed out of the emergency room at [facility]. I did not receive any further information.  I reviewed appointment listings in EPIC. It appears that the patient has an appointment with his primary physician on August 27.   I will forward this information to the cardiologist on our team who follows this patient.  [name]

## 2013-06-03 ENCOUNTER — Encounter: Payer: Self-pay | Admitting: Internal Medicine

## 2013-06-03 ENCOUNTER — Ambulatory Visit (INDEPENDENT_AMBULATORY_CARE_PROVIDER_SITE_OTHER): Payer: Medicare Other | Admitting: Internal Medicine

## 2013-06-03 VITALS — BP 122/64 | HR 56 | Temp 98.0°F | Wt 198.0 lb

## 2013-06-03 DIAGNOSIS — R079 Chest pain, unspecified: Secondary | ICD-10-CM

## 2013-06-03 DIAGNOSIS — IMO0002 Reserved for concepts with insufficient information to code with codable children: Secondary | ICD-10-CM

## 2013-06-03 DIAGNOSIS — W1809XA Striking against other object with subsequent fall, initial encounter: Secondary | ICD-10-CM

## 2013-06-03 DIAGNOSIS — T887XXA Unspecified adverse effect of drug or medicament, initial encounter: Secondary | ICD-10-CM

## 2013-06-03 DIAGNOSIS — S42009K Fracture of unspecified part of unspecified clavicle, subsequent encounter for fracture with nonunion: Secondary | ICD-10-CM

## 2013-06-03 DIAGNOSIS — R42 Dizziness and giddiness: Secondary | ICD-10-CM

## 2013-06-03 DIAGNOSIS — T50905A Adverse effect of unspecified drugs, medicaments and biological substances, initial encounter: Secondary | ICD-10-CM

## 2013-06-03 DIAGNOSIS — W1809XD Striking against other object with subsequent fall, subsequent encounter: Secondary | ICD-10-CM

## 2013-06-03 MED ORDER — HYDROCODONE-ACETAMINOPHEN 10-325 MG PO TABS: 1.0000 | ORAL_TABLET | Freq: Three times a day (TID) | ORAL | Status: DC | PRN

## 2013-06-03 NOTE — Progress Notes (Signed)
Subjective:    Patient ID: [name], male    DOB: 27-May-1946, 67 y.o.   MRN: [id number]  HPI  Pt presents to the clinic today for hospital f/u. He was admitted on 05/28/2012 and was admitted for chest pain. EKG and cardiac enzymes were negative. He was experiencing a CHF flare. He has been being treated with lasix. He has been feeling well. No more chest pain, no more difficulty breathing. His weight is steady. He has some mild swelling in his feet but not worse than normal.  Additionally, he passed out in [employer] on Monday. He fell and broke his left clavicle. He was taken to [place] [facility]. They tried to admit him but he refused.  Ct scan of the head was normal. He was placed in a sling for comfort. He thinks he has been passing out due to his amiodarone. When he takes 2 doses a day, he feels very dizzy. He stopped his evening dose, and he has noticed any palpitations or dizzy episodes. He does not like his pain medicine. It makes him feel weird. He is requesting something additional today that is not as strong.  Review of Systems      Past Medical History  Diagnosis Date  . Hyperlipidemia   . Hypertension   . Depression   . Tobacco abuse   . Duodenal ulcer     AGE 72  . PVD (peripheral vascular disease)     prior extensive endarterectomy of the right external iliac, common femoral bypass, prior R CEA. R iliac disease noted during 03/2013 cath.  . Ischemic leg 2009    with extensive endarterectomy  . Coronary artery disease     a. anterior MI in 1989. b. MI with CABG in 1998. c. Small NSTEMI s/p DES to SVG-OM 02/2012, newly recognized LV dysfunction at that time. d. NSTEMI 03/2013: occlusion of OM branch after touchdown of SVG, likely the culprit, too small for PCI, for med rx.  . Chronic systolic CHF (congestive heart failure)     a. EF 35% by cath 02/2012. b. EF 25-30% by echo 03/2013.  Marland Kitchen Anemia     Instructed 02/2012 to f/u with PCP  . Nocturnal oxygen desaturation 02/2012  .  COPD (chronic obstructive pulmonary disease)     a. Suspected during admit 03/2013.  Marland Kitchen Atrial fibrillation     a. Transient during 03/2013 admission, started on apixaban/amiodarone.  . Myocardial infarction   . 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 Complication of anesthesia     Lungs filled up with fluid when he had "gas anesthesia."  . Anginal pain   . Ischemic cardiomyopathy   . CKD (chronic kidney disease), stage II     Current Outpatient Prescriptions  Medication Sig Dispense Refill  . albuterol (PROVENTIL HFA;VENTOLIN HFA) 108 (90 BASE) MCG/ACT inhaler Inhale 2 puffs into the lungs every 6 (six) hours as needed for wheezing or shortness of breath.  1 Inhaler  1  . amiodarone (PACERONE) 200 MG tablet Take 1 tablet (200 mg total) by mouth 2 (two) times daily.  60 tablet  0  . amLODipine (NORVASC) 5 MG tablet Take 1 tablet (5 mg total) by mouth daily.  90 tablet  3  . apixaban (ELIQUIS) 5 MG TABS tablet Take 1 tablet (5 mg total) by mouth 2 (two) times daily.  60 tablet  3  . aspirin 81 MG tablet Take 1 tablet (81 mg total) by mouth daily.  30 tablet  0  .  atorvastatin (LIPITOR) 80 MG tablet Take 1 tablet (80 mg total) by mouth every evening.  90 tablet  3  . carvedilol (COREG) 12.5 MG tablet Take 1 tablet (12.5 mg total) by mouth 2 (two) times daily with a meal.  60 tablet  0  . clobetasol (OLUX) 0.05 % topical foam Apply topically 2 (two) times daily.  100 g  0  . enalapril (VASOTEC) 5 MG tablet Take 1 tablet (5 mg total) by mouth 2 (two) times daily.  60 tablet  0  . furosemide (LASIX) 40 MG tablet Take 1 tablet (40 mg total) by mouth daily.  30 tablet  0  . isosorbide mononitrate (IMDUR) 30 MG 24 hr tablet Take 1 tablet (30 mg total) by mouth daily.  30 tablet  0  . loratadine (CLARITIN) 10 MG tablet Take 10 mg by mouth every evening.      . metoCLOPramide (REGLAN) 5 MG tablet Take 1 tablet (5 mg total) by mouth 4 (four) times daily.  40 tablet  0  . nitroGLYCERIN (NITROSTAT) 0.4 MG SL tablet  Place 1 tablet (0.4 mg total) under the tongue every 5 (five) minutes as needed for chest pain.  25 tablet  prn  . oxyCODONE (ROXICODONE) 5 MG immediate release tablet Take 1 tablet (5 mg total) by mouth every 6 (six) hours as needed for pain.  30 tablet  0  . tamsulosin (FLOMAX) 0.4 MG CAPS capsule Take 1 capsule (0.4 mg total) by mouth daily.  30 capsule  2   No current facility-administered medications for this visit.    No Known Allergies  Family History  Problem Relation Age of Onset  . Lung disease Mother   . Osteoporosis Mother   . Hypertension Mother   . Deep vein thrombosis Mother   . Lung cancer Father   . Cancer Father   . Heart disease Father   . Hyperlipidemia Father   . Hypertension Father   . Heart attack Father   . ALS Brother   . Heart disease Brother     Heart Disease before age 56 and  AAA  . Hyperlipidemia Brother   . Hypertension Brother   . Heart attack Brother   . Hypertension Sister     History   Social History  . Marital Status: Divorced    Spouse Name: N/A    Number of Children: N/A  . Years of Education: N/A   Occupational History  . Not on file.   Social History Main Topics  . Smoking status: Current Every Day Smoker -- 0.50 packs/day for 50 years    Types: Cigarettes  . Smokeless tobacco: Never Used     Comment: stated that he uses e-cig (03/30/13); 3 cigarettes a day ( 05/12/13)  . Alcohol Use: No  . Drug Use: No  . Sexual Activity: No   Other Topics Concern  . Not on file   Social History Narrative   Smoking history smokes 3 cigarettes daily. Smoking since age 52    Denies etOH   Married to a lady from the [employer]   Total 4 biological kids; 1 murdered 3 living    From [place] originally    Did factory work    No relationship with living kids      Constitutional: Denies fever, malaise, fatigue, headache or abrupt weight changes.  Respiratory: Denies difficulty breathing, shortness of breath, cough or sputum production.    Cardiovascular: Denies chest pain, chest tightness, palpitations or swelling in the  hands or feet.  Musculoskeletal: Pt reports pain in left clavicle. Denies decrease in range of motion, difficulty with gait, muscle pain.  Neurological: Denies dizziness, difficulty with memory, difficulty with speech or problems with balance and coordination.   No other specific complaints in a complete review of systems (except as listed in HPI above).  Objective:   Physical Exam  BP 122/64  Pulse 56  Temp(Src) 98 F (36.7 C) (Oral)  Wt 198 lb (89.812 kg)  BMI 29.23 kg/m2  SpO2 90% Wt Readings from Last 3 Encounters:  06/03/13 198 lb (89.812 kg)  05/30/13 193 lb 2 oz (87.6 kg)  05/27/13 200 lb (90.719 kg)    General: Appears his stated age, well developed, well nourished in NAD. Cardiovascular: Normal rate and rhythm. S1,S2 noted.  No murmur, rubs or gallops noted. No JVD or BLE edema. No carotid bruits noted. Pulmonary/Chest: Normal effort and positive vesicular breath sounds. No respiratory distress. No wheezes, rales or ronchi noted.  Musculoskeletal: Normal range of motion. No signs of joint swelling. No difficulty with gait. Tender at the left [facility] joint but full passive ROM of the left arm. Neurological: Alert and oriented. Cranial nerves II-XII intact. Coordination normal. +DTRs bilaterally.   BMET    Component Value Date/Time   NA 131* 05/30/2013 0400   K 4.0 05/30/2013 0400   CL 92* 05/30/2013 0400   CO2 29 05/30/2013 0400   GLUCOSE 91 05/30/2013 0400   BUN 25* 05/30/2013 0400   CREATININE 1.30 05/30/2013 0400   CALCIUM 8.3* 05/30/2013 0400   GFRNONAA 54* 05/30/2013 0400   GFRAA 63* 05/30/2013 0400    Lipid Panel     Component Value Date/Time   CHOL 121 05/07/2013 1012   TRIG 132.0 05/07/2013 1012   HDL 27.60* 05/07/2013 1012   CHOLHDL 4 05/07/2013 1012   VLDL 26.4 05/07/2013 1012   LDLCALC 67 05/07/2013 1012    CBC    Component Value Date/Time   WBC 10.9* 05/30/2013 0400   RBC  3.03* 05/30/2013 0400   RBC 2.83* 05/26/2013 0500   HGB 9.6* 05/30/2013 0400   HCT 27.3* 05/30/2013 0400   PLT 220 05/30/2013 0400   MCV 90.1 05/30/2013 0400   MCH 31.7 05/30/2013 0400   MCHC 35.2 05/30/2013 0400   RDW 13.8 05/30/2013 0400   LYMPHSABS 1.6 03/21/2013 1833   MONOABS 1.0 03/21/2013 1833   EOSABS 0.4 03/21/2013 1833   BASOSABS 0.0 03/21/2013 1833    Hgb A1C Lab Results  Component Value Date   HGBA1C 7.2* 03/21/2013         Assessment & Plan:   Clavicular fracture s/p fall:  Keep arm in sing for comfort Oxycodone changed to Norco-let me know if this does not work for you Encouraged pt to visit with Dr. Katrinka Blazing our sports medicine doctor Perform shoulder ROM exercises daily  Dizziness, and syncope secondary to medication:  Call Dr. Melburn Popper and make an appt with him ASAP Stop the evening dose of amiodarone Hospital notes reviewed- no additional workup required at this time  RTC as needed or if symptoms persist or worsen

## 2013-06-03 NOTE — Patient Instructions (Signed)
Clavicle Fracture °A clavicle fracture is a break in the collarbone. This is a common injury, especially in children. Collarbones do not harden until around the age of 20. Most collarbone fractures are treated with a simple arm sling. In some cases a figure-of-eight splint is used to help hold the broken bones in position. Although not often needed, surgery may be required if the bone fragments are not in the correct position (displaced).  °HOME CARE INSTRUCTIONS  °· Apply ice to the injury for 15-20 minutes each hour while awake for 2 days. Put the ice in a plastic bag and place a towel between the bag of ice and your skin. °· Wear the sling or splint constantly for as long as directed by your caregiver. You may remove the sling or splint for bathing or showering. Be sure to keep your shoulder in the same place as when the sling or splint is on. Do not lift your arm. °· If a figure-of-eight splint is applied, it must be tightened by another person every day. Tighten it enough to keep the shoulders held back. Allow enough room to place the index finger between the body and strap. Loosen the splint immediately if you feel numbness or tingling in your hands. °· Only take over-the-counter or prescription medicines for pain, discomfort, or fever as directed by your caregiver. °· Avoid activities that irritate or increase the pain for 4 to 6 weeks after surgery. °· Follow all instructions for follow-up with your caregiver. This includes any referrals, physical therapy, and rehabilitation. Any delay in obtaining necessary care could result in a delay or failure of the injury to heal properly. °SEEK MEDICAL CARE IF:  °You have pain and swelling that are not relieved with medications. °SEEK IMMEDIATE MEDICAL CARE IF:  °Your arm is numb, cold, or pale, even when the splint is loose. °MAKE SURE YOU:  °· Understand these instructions. °· Will watch your condition. °· Will get help right away if you are not doing well or get  worse. °Document Released: 07/04/2005 Document Revised: 12/17/2011 Document Reviewed: 04/29/2008 °[employer]® Patient Information ©2014 [employer]. ° °

## 2013-06-04 ENCOUNTER — Telehealth: Payer: Self-pay | Admitting: Cardiovascular Disease

## 2013-06-04 NOTE — Telephone Encounter (Signed)
Pt was seen by pcp/ Baity yesterday/ please read Echart visit.  Pt was in [facility], declined hospitalization/ head CT was neg for bleed. Pt broke collar bone. Soonest available app made with Dr Nahser/ decline seeing another provider. feeling fine currently but feels that since DC from hosp admit in June  that his meds have been making him ill and caused him to pass out.  Pt told not to drive and go to ED with further symptoms,

## 2013-06-04 NOTE — Telephone Encounter (Signed)
New Problem  Pt states he '"fell out" of the scooter chair in [employer]// Unconscious// There was no warning or symtoms prior to blacking out// broke his collar bone and urinated on himself// He only woke up with pain from the fall other than that no symtoms// believes it was one of his medications that is making him blackout.

## 2013-06-05 ENCOUNTER — Other Ambulatory Visit: Payer: Self-pay | Admitting: Cardiovascular Disease

## 2013-06-05 ENCOUNTER — Encounter: Payer: Self-pay | Admitting: Vascular Surgery

## 2013-06-05 MED ORDER — AMIODARONE HCL 200 MG PO TABS: 200.0000 mg | ORAL_TABLET | Freq: Every day | ORAL | Status: DC

## 2013-06-05 NOTE — Telephone Encounter (Signed)
Dr Elease Hashimoto said pt could reduce amiodarone to 200 mg once daily, pt states he already did that on his own. MAR adjusted.

## 2013-06-09 ENCOUNTER — Inpatient Hospital Stay (HOSPITAL_COMMUNITY)
Admission: AD | Admit: 2013-06-09 | Discharge: 2013-06-19 | DRG: 863 | Disposition: A | Discharge status: Home / Self Care | Payer: Medicare Other | Source: Ambulatory Visit | Attending: Vascular Surgery | Admitting: Vascular Surgery

## 2013-06-09 ENCOUNTER — Other Ambulatory Visit: Payer: Self-pay | Admitting: Vascular Surgery

## 2013-06-09 ENCOUNTER — Encounter: Payer: Self-pay | Admitting: Vascular Surgery

## 2013-06-09 ENCOUNTER — Encounter: Payer: Medicare Other | Admitting: Vascular Surgery

## 2013-06-09 ENCOUNTER — Encounter (HOSPITAL_COMMUNITY): Payer: Self-pay | Admitting: General Practice

## 2013-06-09 ENCOUNTER — Ambulatory Visit (INDEPENDENT_AMBULATORY_CARE_PROVIDER_SITE_OTHER): Payer: Self-pay | Admitting: Vascular Surgery

## 2013-06-09 VITALS — BP 151/63 | HR 59 | Temp 98.3°F | Ht 69.0 in | Wt 189.7 lb

## 2013-06-09 DIAGNOSIS — I252 Old myocardial infarction: Secondary | ICD-10-CM

## 2013-06-09 DIAGNOSIS — I255 Ischemic cardiomyopathy: Secondary | ICD-10-CM

## 2013-06-09 DIAGNOSIS — N182 Chronic kidney disease, stage 2 (mild): Secondary | ICD-10-CM | POA: Diagnosis present

## 2013-06-09 DIAGNOSIS — T8149XA Infection following a procedure, other surgical site, initial encounter: Secondary | ICD-10-CM

## 2013-06-09 DIAGNOSIS — Y838 Other surgical procedures as the cause of abnormal reaction of the patient, or of later complication, without mention of misadventure at the time of the procedure: Secondary | ICD-10-CM | POA: Diagnosis present

## 2013-06-09 DIAGNOSIS — I4891 Unspecified atrial fibrillation: Secondary | ICD-10-CM | POA: Diagnosis present

## 2013-06-09 DIAGNOSIS — E785 Hyperlipidemia, unspecified: Secondary | ICD-10-CM | POA: Diagnosis present

## 2013-06-09 DIAGNOSIS — Z79899 Other long term (current) drug therapy: Secondary | ICD-10-CM

## 2013-06-09 DIAGNOSIS — T8140XA Infection following a procedure, unspecified, initial encounter: Principal | ICD-10-CM | POA: Diagnosis present

## 2013-06-09 DIAGNOSIS — I2589 Other forms of chronic ischemic heart disease: Secondary | ICD-10-CM | POA: Diagnosis present

## 2013-06-09 DIAGNOSIS — I129 Hypertensive chronic kidney disease with stage 1 through stage 4 chronic kidney disease, or unspecified chronic kidney disease: Secondary | ICD-10-CM | POA: Diagnosis present

## 2013-06-09 DIAGNOSIS — I2510 Atherosclerotic heart disease of native coronary artery without angina pectoris: Secondary | ICD-10-CM | POA: Diagnosis present

## 2013-06-09 DIAGNOSIS — I739 Peripheral vascular disease, unspecified: Secondary | ICD-10-CM | POA: Diagnosis present

## 2013-06-09 DIAGNOSIS — Z85828 Personal history of other malignant neoplasm of skin: Secondary | ICD-10-CM

## 2013-06-09 DIAGNOSIS — E440 Moderate protein-calorie malnutrition: Secondary | ICD-10-CM | POA: Insufficient documentation

## 2013-06-09 DIAGNOSIS — Z8673 Personal history of transient ischemic attack (TIA), and cerebral infarction without residual deficits: Secondary | ICD-10-CM

## 2013-06-09 DIAGNOSIS — Z7982 Long term (current) use of aspirin: Secondary | ICD-10-CM

## 2013-06-09 DIAGNOSIS — J4489 Other specified chronic obstructive pulmonary disease: Secondary | ICD-10-CM | POA: Diagnosis present

## 2013-06-09 DIAGNOSIS — Z951 Presence of aortocoronary bypass graft: Secondary | ICD-10-CM | POA: Diagnosis present

## 2013-06-09 DIAGNOSIS — F17200 Nicotine dependence, unspecified, uncomplicated: Secondary | ICD-10-CM | POA: Diagnosis present

## 2013-06-09 DIAGNOSIS — I509 Heart failure, unspecified: Secondary | ICD-10-CM | POA: Diagnosis present

## 2013-06-09 DIAGNOSIS — I10 Essential (primary) hypertension: Secondary | ICD-10-CM | POA: Diagnosis present

## 2013-06-09 DIAGNOSIS — IMO0001 Reserved for inherently not codable concepts without codable children: Secondary | ICD-10-CM

## 2013-06-09 DIAGNOSIS — I5022 Chronic systolic (congestive) heart failure: Secondary | ICD-10-CM | POA: Diagnosis present

## 2013-06-09 DIAGNOSIS — J449 Chronic obstructive pulmonary disease, unspecified: Secondary | ICD-10-CM | POA: Diagnosis present

## 2013-06-09 DIAGNOSIS — R55 Syncope and collapse: Secondary | ICD-10-CM | POA: Diagnosis not present

## 2013-06-09 HISTORY — DX: Unspecified osteoarthritis, unspecified site: M19.90

## 2013-06-09 HISTORY — DX: Peptic ulcer, site unspecified, unspecified as acute or chronic, without hemorrhage or perforation: K27.9

## 2013-06-09 HISTORY — DX: Peripheral vascular disease, unspecified: I73.9

## 2013-06-09 HISTORY — DX: ST elevation (STEMI) myocardial infarction of unspecified site: I21.3

## 2013-06-09 HISTORY — DX: Non-ST elevation (NSTEMI) myocardial infarction: I21.4

## 2013-06-09 HISTORY — DX: Migraine, unspecified, not intractable, without status migrainosus: G43.909

## 2013-06-09 HISTORY — DX: ST elevation (STEMI) myocardial infarction involving other coronary artery of anterior wall: I21.09

## 2013-06-09 HISTORY — DX: Rheumatic fever without heart involvement: I00

## 2013-06-09 HISTORY — DX: Basal cell carcinoma of skin of unspecified parts of face: C44.310

## 2013-06-09 HISTORY — DX: Pneumonia, unspecified organism: J18.9

## 2013-06-09 HISTORY — DX: Anxiety disorder, unspecified: F41.9

## 2013-06-09 HISTORY — DX: Shortness of breath: R06.02

## 2013-06-09 HISTORY — DX: Cerebral infarction, unspecified: I63.9

## 2013-06-09 LAB — COMPREHENSIVE METABOLIC PANEL
AST: 22 U/L (ref 0–37)
Albumin: 3 g/dL — ABNORMAL LOW (ref 3.5–5.2)
Calcium: 8.9 mg/dL (ref 8.4–10.5)
Chloride: 95 mEq/L — ABNORMAL LOW (ref 96–112)
Creatinine, Ser: 1.24 mg/dL (ref 0.50–1.35)
Total Protein: 6.4 g/dL (ref 6.0–8.3)

## 2013-06-09 LAB — CBC
MCH: 31.4 pg (ref 26.0–34.0)
MCHC: 34.9 g/dL (ref 30.0–36.0)
MCV: 89.9 fL (ref 78.0–100.0)
Platelets: 297 10*3/uL (ref 150–400)
RDW: 14.1 % (ref 11.5–15.5)
WBC: 9.6 10*3/uL (ref 4.0–10.5)

## 2013-06-09 LAB — PROTIME-INR: INR: 1.25 (ref 0.00–1.49)

## 2013-06-09 MED ORDER — ISOSORBIDE MONONITRATE ER 30 MG PO TB24
30.0000 mg | ORAL_TABLET | Freq: Every day | ORAL | Status: DC
  Administered 2013-06-09 – 2013-06-19 (×11): 30 mg via ORAL
  Filled 2013-06-09 (×11): qty 1

## 2013-06-09 MED ORDER — ENALAPRIL MALEATE 5 MG PO TABS
5.0000 mg | ORAL_TABLET | Freq: Two times a day (BID) | ORAL | Status: DC
  Administered 2013-06-09 – 2013-06-16 (×16): 5 mg via ORAL
  Filled 2013-06-09 (×18): qty 1

## 2013-06-09 MED ORDER — MORPHINE SULFATE 2 MG/ML IJ SOLN
2.0000 mg | INTRAMUSCULAR | Status: DC | PRN
  Administered 2013-06-12 – 2013-06-18 (×4): 2 mg via INTRAVENOUS
  Filled 2013-06-09 (×4): qty 1

## 2013-06-09 MED ORDER — SODIUM CHLORIDE 0.9 % IJ SOLN
3.0000 mL | Freq: Two times a day (BID) | INTRAMUSCULAR | Status: DC
  Administered 2013-06-09 – 2013-06-18 (×9): 3 mL via INTRAVENOUS

## 2013-06-09 MED ORDER — PHENOL 1.4 % MT LIQD
1.0000 | OROMUCOSAL | Status: DC | PRN
  Filled 2013-06-09: qty 177

## 2013-06-09 MED ORDER — METOPROLOL TARTRATE 1 MG/ML IV SOLN: 2.0000 mg | INTRAVENOUS | Status: DC | PRN

## 2013-06-09 MED ORDER — AMIODARONE HCL 200 MG PO TABS: 200.0000 mg | ORAL_TABLET | Freq: Every day | ORAL | Status: DC

## 2013-06-09 MED ORDER — ATORVASTATIN CALCIUM 80 MG PO TABS
80.0000 mg | ORAL_TABLET | Freq: Every evening | ORAL | Status: DC
  Administered 2013-06-09 – 2013-06-18 (×10): 80 mg via ORAL
  Filled 2013-06-09 (×11): qty 1

## 2013-06-09 MED ORDER — CARVEDILOL 12.5 MG PO TABS
12.5000 mg | ORAL_TABLET | Freq: Two times a day (BID) | ORAL | Status: DC
  Administered 2013-06-09 – 2013-06-11 (×3): 12.5 mg via ORAL
  Filled 2013-06-09 (×6): qty 1

## 2013-06-09 MED ORDER — GUAIFENESIN-DM 100-10 MG/5ML PO SYRP: 15.0000 mL | ORAL_SOLUTION | ORAL | Status: DC | PRN

## 2013-06-09 MED ORDER — LORATADINE 10 MG PO TABS
10.0000 mg | ORAL_TABLET | Freq: Every evening | ORAL | Status: DC
  Administered 2013-06-09 – 2013-06-18 (×10): 10 mg via ORAL
  Filled 2013-06-09 (×11): qty 1

## 2013-06-09 MED ORDER — POTASSIUM CHLORIDE CRYS ER 20 MEQ PO TBCR
20.0000 meq | EXTENDED_RELEASE_TABLET | Freq: Once | ORAL | Status: AC
  Administered 2013-06-09: 20 meq via ORAL
  Filled 2013-06-09: qty 1

## 2013-06-09 MED ORDER — BISACODYL 10 MG RE SUPP: 10.0000 mg | Freq: Every day | RECTAL | Status: DC | PRN

## 2013-06-09 MED ORDER — SENNA 8.6 MG PO TABS
1.0000 | ORAL_TABLET | Freq: Two times a day (BID) | ORAL | Status: DC
  Administered 2013-06-09 – 2013-06-17 (×8): 8.6 mg via ORAL
  Filled 2013-06-09 (×22): qty 1

## 2013-06-09 MED ORDER — AMLODIPINE BESYLATE 5 MG PO TABS
5.0000 mg | ORAL_TABLET | Freq: Every day | ORAL | Status: DC
  Administered 2013-06-09 – 2013-06-19 (×10): 5 mg via ORAL
  Filled 2013-06-09 (×11): qty 1

## 2013-06-09 MED ORDER — HYDRALAZINE HCL 20 MG/ML IJ SOLN: 10.0000 mg | INTRAMUSCULAR | Status: DC | PRN

## 2013-06-09 MED ORDER — ASPIRIN 81 MG PO TABS: 81.0000 mg | ORAL_TABLET | Freq: Every day | ORAL | Status: DC

## 2013-06-09 MED ORDER — SODIUM CHLORIDE 0.9 % IV SOLN: 250.0000 mL | INTRAVENOUS | Status: DC | PRN

## 2013-06-09 MED ORDER — PIPERACILLIN-TAZOBACTAM 3.375 G IVPB
3.3750 g | Freq: Three times a day (TID) | INTRAVENOUS | Status: DC
  Administered 2013-06-09 – 2013-06-16 (×21): 3.375 g via INTRAVENOUS
  Filled 2013-06-09 (×23): qty 50

## 2013-06-09 MED ORDER — FUROSEMIDE 40 MG PO TABS
40.0000 mg | ORAL_TABLET | Freq: Every day | ORAL | Status: DC
Start: 2013-06-09 — End: 2013-06-19
  Administered 2013-06-09 – 2013-06-19 (×11): 40 mg via ORAL
  Filled 2013-06-09 (×11): qty 1

## 2013-06-09 MED ORDER — ACETAMINOPHEN 325 MG PO TABS: 325.0000 mg | ORAL_TABLET | ORAL | Status: DC | PRN

## 2013-06-09 MED ORDER — CLOBETASOL PROPIONATE 0.05 % EX CREA
TOPICAL_CREAM | Freq: Two times a day (BID) | CUTANEOUS | Status: DC
  Administered 2013-06-09 – 2013-06-12 (×2): via TOPICAL
  Filled 2013-06-09: qty 15

## 2013-06-09 MED ORDER — ACETAMINOPHEN 650 MG RE SUPP: 325.0000 mg | RECTAL | Status: DC | PRN

## 2013-06-09 MED ORDER — VANCOMYCIN HCL 10 G IV SOLR
1500.0000 mg | INTRAVENOUS | Status: DC
  Administered 2013-06-10 – 2013-06-15 (×6): 1500 mg via INTRAVENOUS
  Filled 2013-06-09 (×7): qty 1500

## 2013-06-09 MED ORDER — AMIODARONE HCL 100 MG PO TABS
100.0000 mg | ORAL_TABLET | Freq: Two times a day (BID) | ORAL | Status: DC
  Filled 2013-06-09 (×2): qty 1

## 2013-06-09 MED ORDER — APIXABAN 5 MG PO TABS
5.0000 mg | ORAL_TABLET | Freq: Two times a day (BID) | ORAL | Status: DC
  Administered 2013-06-09 – 2013-06-19 (×21): 5 mg via ORAL
  Filled 2013-06-09 (×22): qty 1

## 2013-06-09 MED ORDER — SODIUM CHLORIDE 0.9 % IJ SOLN
3.0000 mL | INTRAMUSCULAR | Status: DC | PRN
  Administered 2013-06-11: 3 mL via INTRAVENOUS

## 2013-06-09 MED ORDER — ASPIRIN EC 81 MG PO TBEC
81.0000 mg | DELAYED_RELEASE_TABLET | Freq: Every day | ORAL | Status: DC
  Administered 2013-06-09 – 2013-06-19 (×11): 81 mg via ORAL
  Filled 2013-06-09 (×11): qty 1

## 2013-06-09 MED ORDER — METOCLOPRAMIDE HCL 5 MG PO TABS
5.0000 mg | ORAL_TABLET | Freq: Four times a day (QID) | ORAL | Status: DC
  Administered 2013-06-09 – 2013-06-11 (×7): 5 mg via ORAL
  Filled 2013-06-09 (×10): qty 1

## 2013-06-09 MED ORDER — AMIODARONE HCL 200 MG PO TABS
200.0000 mg | ORAL_TABLET | Freq: Every day | ORAL | Status: DC
  Administered 2013-06-10 – 2013-06-19 (×8): 200 mg via ORAL
  Filled 2013-06-09 (×10): qty 1

## 2013-06-09 MED ORDER — VANCOMYCIN HCL 10 G IV SOLR
1500.0000 mg | Freq: Once | INTRAVENOUS | Status: AC
  Administered 2013-06-09: 1500 mg via INTRAVENOUS
  Filled 2013-06-09: qty 1500

## 2013-06-09 MED ORDER — CLOBETASOL PROPIONATE 0.05 % EX FOAM: Freq: Two times a day (BID) | CUTANEOUS | Status: DC

## 2013-06-09 MED ORDER — NITROGLYCERIN 0.4 MG SL SUBL: 0.4000 mg | SUBLINGUAL_TABLET | SUBLINGUAL | Status: DC | PRN

## 2013-06-09 MED ORDER — ONDANSETRON HCL 4 MG/2ML IJ SOLN
4.0000 mg | Freq: Four times a day (QID) | INTRAMUSCULAR | Status: DC | PRN
  Administered 2013-06-16: 4 mg via INTRAVENOUS
  Filled 2013-06-09: qty 2

## 2013-06-09 MED ORDER — OXYCODONE-ACETAMINOPHEN 5-325 MG PO TABS
1.0000 | ORAL_TABLET | ORAL | Status: DC | PRN
  Administered 2013-06-09 – 2013-06-11 (×7): 2 via ORAL
  Administered 2013-06-11: 1 via ORAL
  Administered 2013-06-11: 2 via ORAL
  Administered 2013-06-11 (×2): 1 via ORAL
  Administered 2013-06-12 – 2013-06-16 (×15): 2 via ORAL
  Administered 2013-06-16 (×2): 1 via ORAL
  Administered 2013-06-16: 2 via ORAL
  Administered 2013-06-17: 1 via ORAL
  Administered 2013-06-17 (×2): 2 via ORAL
  Administered 2013-06-17: 1 via ORAL
  Administered 2013-06-18 – 2013-06-19 (×5): 2 via ORAL
  Filled 2013-06-09: qty 1
  Filled 2013-06-09 (×18): qty 2
  Filled 2013-06-09: qty 1
  Filled 2013-06-09 (×5): qty 2
  Filled 2013-06-09 (×2): qty 1
  Filled 2013-06-09: qty 2
  Filled 2013-06-09: qty 1
  Filled 2013-06-09 (×3): qty 2
  Filled 2013-06-09: qty 1
  Filled 2013-06-09 (×5): qty 2

## 2013-06-09 MED ORDER — TAMSULOSIN HCL 0.4 MG PO CAPS
0.4000 mg | ORAL_CAPSULE | Freq: Every day | ORAL | Status: DC
  Administered 2013-06-09 – 2013-06-18 (×10): 0.4 mg via ORAL
  Filled 2013-06-09 (×11): qty 1

## 2013-06-09 MED ORDER — ALBUTEROL SULFATE HFA 108 (90 BASE) MCG/ACT IN AERS
2.0000 | INHALATION_SPRAY | Freq: Four times a day (QID) | RESPIRATORY_TRACT | Status: DC | PRN
Start: 2013-06-09 — End: 2013-06-19
  Administered 2013-06-11 – 2013-06-17 (×4): 2 via RESPIRATORY_TRACT
  Filled 2013-06-09 (×2): qty 6.7

## 2013-06-09 MED ORDER — LABETALOL HCL 5 MG/ML IV SOLN
10.0000 mg | INTRAVENOUS | Status: DC | PRN
  Filled 2013-06-09: qty 4

## 2013-06-09 NOTE — Progress Notes (Signed)
      [facility]           [facility] [place]   Pt being admitted with infected groin wound. IV antibiotics and wound care. Hospital orders written

## 2013-06-09 NOTE — Progress Notes (Signed)
Utilization Review Completed.Katieann Hungate T9/11/2012  

## 2013-06-09 NOTE — Addendum Note (Signed)
Addended by: Marlowe Shores on: 06/09/2013 10:56 AM   Modules accepted: Orders
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
## 2013-06-09 NOTE — Addendum Note (Signed)
Addended by: Josephina Gip D on: 06/09/2013 01:13 PM   Modules accepted: Level of Service

## 2013-06-09 NOTE — Consult Note (Signed)
ANTIBIOTIC CONSULT NOTE - INITIAL  Pharmacy Consult for Vancomycin Indication: infected groin wound  No Known Allergies  Patient Measurements: Height: 5' 8.5" (174 cm) Weight: 186 lb 1.1 oz (84.4 kg) IBW/kg (Calculated) : 69.55   Vital Signs: Temp: 97.7 F (36.5 C) (09/02 1130) Temp src: Oral (09/02 1130) BP: 150/68 mmHg (09/02 1130) Pulse Rate: 64 (09/02 1130) Intake/Output from previous day:   Intake/Output from this shift:    Labs: No results found for this basename: WBC, HGB, PLT, LABCREA, CREATININE,  in the last 72 hours Estimated Creatinine Clearance: 57.3 ml/min (by C-G formula based on Cr of 1.3).  Medical History: Past Medical History  Diagnosis Date  . Hyperlipidemia   . Hypertension   . Depression   . Tobacco abuse   . Duodenal ulcer     AGE 67  . PVD (peripheral vascular disease)     prior extensive endarterectomy of the right external iliac, common femoral bypass, prior R CEA. R iliac disease noted during 03/2013 cath.  . Ischemic leg 2009    with extensive endarterectomy  . Coronary artery disease     a. anterior MI in 1989. b. MI with CABG in 1998. c. Small NSTEMI s/p DES to SVG-OM 02/2012, newly recognized LV dysfunction at that time. d. NSTEMI 03/2013: occlusion of OM branch after touchdown of SVG, likely the culprit, too small for PCI, for med rx.  . Chronic systolic CHF (congestive heart failure)     a. EF 35% by cath 02/2012. b. EF 25-30% by echo 03/2013.  Marland Kitchen Anemia     Instructed 02/2012 to f/u with PCP  . Nocturnal oxygen desaturation 02/2012  . COPD (chronic obstructive pulmonary disease)     a. Suspected during admit 03/2013.  Marland Kitchen Atrial fibrillation     a. Transient during 03/2013 admission, started on apixaban/amiodarone.  . Myocardial infarction   . 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 Complication of anesthesia     Lungs filled up with fluid when he had "gas anesthesia."  . Anginal pain   . Ischemic cardiomyopathy   . CKD (chronic kidney disease),  stage II     Assessment: 67yom s/p left external iliac endarterectomy with external iliac to profunda femorous bypass graft on 05/21/13 now presents with an infected left groin wound. He will begin empiric vancomycin. Labs have not been drawn yet but during previous admission he was noted to have renal insufficiency.  Goal of Therapy:  Vancomycin trough level 10-15 mcg/ml  Plan:  1) Vancomycin 1500mg  IV x 1 now 2) Follow up labs to determine maintenance dosing 3) Zosyn 3.375g IV q8 as ordered by MD - will adjust if needed  Fredrik Rigger 06/09/2013,11:37 AM

## 2013-06-09 NOTE — Progress Notes (Addendum)
Subjective:     Patient ID: [name], male   DOB: January 25, 1946, 67 y.o.   MRN: [id number]  HPI this 67 year old male returns having had an extensive left femoral and iliac endarterectomy a few weeks ago with insertion of a Dacron graft from the external iliac to the distal profunda femoris artery. He has been noticing malodorous drainage from the left inguinal area over the past few days. He comes to the office today because of the left groin problem. He states that the left foot pain and he was experiencing preoperatively is now resolved and he is able to sleep at night  Review of Systems     Objective:   Physical Exam BP 151/63  Pulse 59  Temp(Src) 98.3 F (36.8 C) (Oral)  Ht 5\' 9"  (1.753 m)  Wt 189 lb 11.2 oz (86.047 kg)  BMI 28 kg/m2  SpO2 96%  Gen. chronically ill-appearing male no apparent stress alert and oriented x3 Lungs no rhonchi or wheezing Left inguinal area with extensive wound necrosis involving distal two thirds of the inguinal wound. This was separated with necrotic subcutaneous tissue which was sharply debrided. The deep suture line covering the synthetic graft is intact and the graft was not exposed. There was no active cellulitis surrounding this. After debriding it was packed with a moist saline gauze. Patient had good Doppler flow in the dorsalis pedis and posterior tibial artery in the left foot and it appeared pink and well perfused Cultures obtained      Assessment:     Left inguinal wound infection with extensive fat necrosis-sharply debrided and wound packed    Plan:     admit to hospital today for IV antibiotics-vancomycin and Zosyn and frequent wound packing and daily debridement

## 2013-06-09 NOTE — Progress Notes (Deleted)
Subjective:     Patient ID: [name], male   DOB: [birth year], 67 y.o.   MRN: [id number]  HPI 67 year old male is referred for evaluation of vascular access. He is right-handed. He does have diabetes mellitus-type I. He has never had vascular access in the past. He is not on hemodialysis yet.  Past Medical History  Diagnosis Date  . Hyperlipidemia   . Hypertension   . Depression   . Tobacco abuse   . Duodenal ulcer     AGE 67  . PVD (peripheral vascular disease)     prior extensive endarterectomy of the right external iliac, common femoral bypass, prior R CEA. R iliac disease noted during 03/2013 cath.  . Ischemic leg 2009    with extensive endarterectomy  . Coronary artery disease     a. anterior MI in 1989. b. MI with CABG in 1998. c. Small NSTEMI s/p DES to SVG-OM 02/2012, newly recognized LV dysfunction at that time. d. NSTEMI 03/2013: occlusion of OM branch after touchdown of SVG, likely the culprit, too small for PCI, for med rx.  . Chronic systolic CHF (congestive heart failure)     a. EF 35% by cath 02/2012. b. EF 25-30% by echo 03/2013.  Marland Kitchen Anemia     Instructed 02/2012 to f/u with PCP  . Nocturnal oxygen desaturation 02/2012  . COPD (chronic obstructive pulmonary disease)     a. Suspected during admit 03/2013.  Marland Kitchen Atrial fibrillation     a. Transient during 03/2013 admission, started on apixaban/amiodarone.  . Myocardial infarction   . 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 Complication of anesthesia     Lungs filled up with fluid when he had "gas anesthesia."  . Anginal pain   . Ischemic cardiomyopathy   . CKD (chronic kidney disease), stage II     History  Substance Use Topics  . Smoking status: Current Every Day Smoker -- 0.50 packs/day for 50 years    Types: Cigarettes  . Smokeless tobacco: Never Used     Comment: stated that he uses e-cig (03/30/13); 3 cigarettes a day ( 05/12/13)  . Alcohol Use: No    Family History  Problem Relation Age of Onset  . Lung disease Mother   .  Osteoporosis Mother   . Hypertension Mother   . Deep vein thrombosis Mother   . Lung cancer Father   . Cancer Father   . Heart disease Father   . Hyperlipidemia Father   . Hypertension Father   . Heart attack Father   . ALS Brother   . Heart disease Brother     Heart Disease before age 51 and  AAA  . Hyperlipidemia Brother   . Hypertension Brother   . Heart attack Brother   . Hypertension Sister     No Known Allergies  Current outpatient prescriptions:albuterol (PROVENTIL HFA;VENTOLIN HFA) 108 (90 BASE) MCG/ACT inhaler, Inhale 2 puffs into the lungs every 6 (six) hours as needed for wheezing or shortness of breath., Disp: 1 Inhaler, Rfl: 1;  amiodarone (PACERONE) 200 MG tablet, Take 1 tablet (200 mg total) by mouth daily., Disp: 30 tablet, Rfl: 6;  amLODipine (NORVASC) 5 MG tablet, Take 1 tablet (5 mg total) by mouth daily., Disp: 90 tablet, Rfl: 3 apixaban (ELIQUIS) 5 MG TABS tablet, Take 1 tablet (5 mg total) by mouth 2 (two) times daily., Disp: 60 tablet, Rfl: 3;  aspirin 81 MG tablet, Take 1 tablet (81 mg total) by mouth daily., Disp: 30  tablet, Rfl: 0;  atorvastatin (LIPITOR) 80 MG tablet, Take 1 tablet (80 mg total) by mouth every evening., Disp: 90 tablet, Rfl: 3 carvedilol (COREG) 12.5 MG tablet, TAKE 1 TABLET (12.5 MG TOTAL) BY MOUTH 2 (TWO) TIMES DAILY WITH A MEAL., Disp: 60 tablet, Rfl: 0;  clobetasol (OLUX) 0.05 % topical foam, Apply topically 2 (two) times daily., Disp: 100 g, Rfl: 0;  enalapril (VASOTEC) 5 MG tablet, Take 1 tablet (5 mg total) by mouth 2 (two) times daily., Disp: 60 tablet, Rfl: 0;  furosemide (LASIX) 40 MG tablet, Take 1 tablet (40 mg total) by mouth daily., Disp: 30 tablet, Rfl: 0 isosorbide mononitrate (IMDUR) 30 MG 24 hr tablet, TAKE 1 TABLET (30 MG TOTAL) BY MOUTH DAILY., Disp: 30 tablet, Rfl: 0;  loratadine (CLARITIN) 10 MG tablet, Take 10 mg by mouth every evening., Disp: , Rfl: ;  metoCLOPramide (REGLAN) 5 MG tablet, Take 1 tablet (5 mg total) by mouth 4  (four) times daily., Disp: 40 tablet, Rfl: 0 nitroGLYCERIN (NITROSTAT) 0.4 MG SL tablet, Place 1 tablet (0.4 mg total) under the tongue every 5 (five) minutes as needed for chest pain., Disp: 25 tablet, Rfl: prn;  oxyCODONE (OXY IR/ROXICODONE) 5 MG immediate release tablet, Take 1 tablet by mouth., Disp: , Rfl: ;  tamsulosin (FLOMAX) 0.4 MG CAPS capsule, Take 1 capsule (0.4 mg total) by mouth daily., Disp: 30 capsule, Rfl: 2 HYDROcodone-acetaminophen (NORCO) 10-325 MG per tablet, Take 1 tablet by mouth every 8 (eight) hours as needed for pain., Disp: 30 tablet, Rfl: 0  BP 151/63  Pulse 59  Temp(Src) 98.3 F (36.8 C) (Oral)  Ht 5\' 9"  (1.753 m)  Wt 189 lb 11.2 oz (86.047 kg)  BMI 28 kg/m2  SpO2 96%  Body mass index is 28 kg/(m^2).           Review of Systems denies chest pain but does have dyspnea on exertion, orthopnea, leg pain while lying flat, history of DVT, bilateral lower chimney edema, productive cough. All other systems negative and complete review of systems     Objective:   Physical Exam BP 151/63  Pulse 59  Temp(Src) 98.3 F (36.8 C) (Oral)  Ht 5\' 9"  (1.753 m)  Wt 189 lb 11.2 oz (86.047 kg)  BMI 28 kg/m2  SpO2 96%  Gen.-alert and oriented x3 in no apparent distress HEENT normal for age Lungs no rhonchi or wheezing Cardiovascular regular rhythm no murmurs carotid pulses 3+ palpable no bruits audible Abdomen soft nontender no palpable masses-obese Musculoskeletal free of  major deformities Skin clear -no rashes Neurologic normal Lower extremities 3+ femoral and dorsalis pedis pulses palpable bilaterally with 1-2+ edema bilaterally  Today I ordered bilateral upper extremity vein mapping which are reviewed and interpreted. Upper arm cephalic veins are not visualized bilaterally. He does appear to have a satisfactory left forearm cephalic vein as well as a basilic vein.        Assessment:     End-stage renal disease-needs vascular access-right handed     Plan:     Plan left radial-cephalic AV fistula on Thursday, September 4. Patient will take half of his normal insulin dose that AM Risks and benefits of this procedure discussed with patient and he would like to proceed

## 2013-06-10 ENCOUNTER — Ambulatory Visit: Payer: Medicare Other | Admitting: Cardiovascular Disease

## 2013-06-10 DIAGNOSIS — E440 Moderate protein-calorie malnutrition: Secondary | ICD-10-CM | POA: Insufficient documentation

## 2013-06-10 LAB — URINALYSIS, ROUTINE W REFLEX MICROSCOPIC
Leukocytes, UA: NEGATIVE
Nitrite: NEGATIVE
Specific Gravity, Urine: 1.009 (ref 1.005–1.030)
Urobilinogen, UA: 0.2 mg/dL (ref 0.0–1.0)
pH: 6 (ref 5.0–8.0)

## 2013-06-10 MED ORDER — ENSURE COMPLETE PO LIQD
237.0000 mL | Freq: Two times a day (BID) | ORAL | Status: DC
  Administered 2013-06-10 – 2013-06-16 (×9): 237 mL via ORAL

## 2013-06-10 NOTE — Progress Notes (Signed)
Left groin dressing changed per order; tolerated well.[name]

## 2013-06-10 NOTE — Progress Notes (Signed)
Patient ID: [name], male   DOB: November 14, 1945, 67 y.o.   MRN: [id number] Vascular Surgery Progress Note  Subjective: Admitted yesterday from office with fat necrosis and secondary infection in left inguinal wound. Patient had left external iliac to profunda femoris bypass with Dacron performed a few weeks ago. Cultures are pending. Patient currently on vancomycin and Zosyn. Denies chills and fever.  Objective:  Filed Vitals:   06/10/13 0657  BP:   Pulse: 47  Temp:   Resp:     General alert and oriented x3 in no apparent distress Left inguinal wound exam and. It is cleaner than yesterday in office. Being packed q. 8 hours with moist saline gauze. Left foot is pink and well perfused.   Labs:  Recent Labs Lab 06/09/13 1553  CREATININE 1.24    Recent Labs Lab 06/09/13 1553  NA 131*  K 4.0  CL 95*  CO2 24  BUN 20  CREATININE 1.24  GLUCOSE 112*  CALCIUM 8.9    Recent Labs Lab 06/09/13 1553  WBC 9.6  HGB 9.6*  HCT 27.5*  PLT 297    Recent Labs Lab 06/09/13 1553  INR 1.25    I/O last 3 completed shifts: In: 720 [P.O.:720] Out: 1550 [Urine:1550]  Imaging: No results found.  Assessment/Plan:    LOS: 1 day  s/p   Continue local wound care with packing 3 times daily and IV antibiotics-vancomycin and Zosyn pending culture results Will debris daily as necessary Dacron graft is not exposed currently and no cellulitis-hopefully this will heal without secondary graft infection   Josephina Gip, MD 06/10/2013 9:15 AM
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
## 2013-06-11 MED ORDER — CARVEDILOL 6.25 MG PO TABS
6.2500 mg | ORAL_TABLET | Freq: Two times a day (BID) | ORAL | Status: DC
  Administered 2013-06-12 – 2013-06-18 (×6): 6.25 mg via ORAL
  Filled 2013-06-11 (×17): qty 1

## 2013-06-11 MED ORDER — METOCLOPRAMIDE HCL 5 MG PO TABS
5.0000 mg | ORAL_TABLET | Freq: Three times a day (TID) | ORAL | Status: DC
  Administered 2013-06-11 – 2013-06-19 (×32): 5 mg via ORAL
  Filled 2013-06-11 (×35): qty 1

## 2013-06-11 NOTE — Progress Notes (Signed)
06/11/2013 1615 Nursing note Pt. HR has been running SB 47-52 all day. Pt. Asymptomatic, BP 112/52.  Ronie Spies [facility] paged and made aware. Orders received and enacted. Will continue to monitor patient.  Aayden Cefalu, Blanchard Kelch

## 2013-06-11 NOTE — Progress Notes (Signed)
06/11/2013 1010 Nursing note  Spoke with Ronie Spies [facility] regarding pt. HR SB 48-58. Verbal orders received ok to administer Amiodarone as well as all other scheduled medications. Orders enacted. Will continue to monitor patient.  Henrine Hayter, Blanchard Kelch
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
## 2013-06-11 NOTE — Progress Notes (Signed)
Pt. Wet to dry dressing change completed of left groin at 0250 per pt. Request due to adjusted schedule day before.  Pt. Premedicated and tolerated well.  Will continue to monitor.   Thane Edu, RN

## 2013-06-11 NOTE — Progress Notes (Addendum)
  VASCULAR SURGERY BYPASS PROGRESS NOTE   SUBJECTIVE: [name] is a 67 y.o. male who had External iliac endarterectomy with external iliac to profunda femorous bypass graft using 8mm Hemashield graft.-Left by Dr. Hart Rochester 05/21/13. Pt is here with open left groin wound. Ambulating. Cardiology reviewing and adjusting meds.  No C/O.  PHYSICAL EXAM: BP Readings from Last 3 Encounters:  06/11/13 129/59  06/09/13 151/63  06/03/13 122/64   Temp Readings from Last 3 Encounters:  06/11/13 98 F (36.7 C) Oral  06/09/13 98.3 F (36.8 C) Oral  06/03/13 98 F (36.7 C) Oral   Pulse Readings from Last 3 Encounters:  06/11/13 54  06/09/13 59  06/03/13 56   SpO2 Readings from Last 3 Encounters:  06/11/13 98%  06/09/13 96%  06/03/13 90%     Intake/Output Summary (Last 24 hours) at 06/11/13 1610 Last data filed at 06/11/13 9604  Gross per 24 hour  Intake   1330 ml  Output   3425 ml  Net  -2095 ml    Extremities: Incisions clean, dry and intact, beginning to granulate, Some exudative tissue in wound Left leg warm with good sensation and motion  LABS: Lab Results  Component Value Date   WBC 9.6 06/09/2013   HGB 9.6* 06/09/2013   HCT 27.5* 06/09/2013   MCV 89.9 06/09/2013   PLT 297 06/09/2013   Lab Results  Component Value Date   CREATININE 1.24 06/09/2013   Lab Results  Component Value Date   INR 1.25 06/09/2013       ASSESSMENT: open left groin wound - cleaning up  3 weeks s/p External iliac endarterectomy with external iliac to profunda femorous bypass graft using 8mm Hemashield graft.-Left  Culture shows Gram + cocci in pairs - awaiting sensitivities Syncopal episode as an out pt - no further issues - cardiology investigating for dysrythmia  PLAN: Ambulating Wound Management: continue TID wet to dry dressings Continue antibiotics DVT prophylaxis: eliquis Statin: lipitor Appreciate cardiology help  When examined and debrided  Much cleaner than 2 days ago Graft not  exposed No surrounding cellulitis Left foot remains well perfused  Gram-positive cocci from cultures-we'll continue vancomycin and Zosyn until final culture results available Continue q. 8 hour dressing changes

## 2013-06-11 NOTE — Progress Notes (Signed)
HR running on low side. Will hold this evening's Coreg and resume lower dose in AM. Ronie Spies PA-C

## 2013-06-11 NOTE — Care Management Note (Signed)
    Page 1 of 2   06/19/2013     2:43:44 PM   CARE MANAGEMENT NOTE 06/19/2013  Patient:  [name], [name]   Account Number:  [id number]  Date Initiated:  06/11/2013  Documentation initiated by:  Helga Asbury  Subjective/Objective Assessment:   PT ADM ON 06/09/2013 WITH GROIN WOUND INFECTION.  PTA, PT INDEPENDENT, LIVES AT HOME WITH SPOUSE.     Action/Plan:   PT ACTIVE WITH [facility] FOR HOME CARE NEEDS.  WILL NEED RESUMPTION OF CARE ORDERS PRIOR TO DC HOME.   Anticipated DC Date:  06/14/2013   Anticipated DC Plan:  HOME W HOME HEALTH SERVICES      DC Planning Services  CM consult      [facility] Choice  HOME HEALTH  Resumption Of Svcs/PTA Provider   Choice offered to / List presented to:  C-1 Patient   DME arranged  VAC      DME agency  [facility]     HH arranged  HH-1 RN      [facility] agency  [facility]   Status of service:  Completed, signed off Medicare Important Message given?   (If response is "NO", the following Medicare IM given date fields will be blank) Date Medicare IM given:   Date Additional Medicare IM given:    Discharge Disposition:  HOME W HOME HEALTH SERVICES  Per UR Regulation:  Reviewed for med. necessity/level of care/duration of stay  If discussed at Long Length of Stay Meetings, dates discussed:   06/09/2013  06/18/2013    Comments:  06/19/13 Draylon Mercadel,RN,BSN [phone] VAC APPROVED AND FOR DELIVERY TODAY BEFORE 1PM.  [facility] TO FOLLOW WITH PT AT HOME FOR WOUND CARE, AND THEY ARE AWARE OF PT DC TODAY.  06/18/13 Trei Schoch,RN,BSN [phone] FAXED COMPLETED INSURANCE AUTH FORMS TO [facility].  [facility] AWARE OF LIKELY DC TOMORROW WITH WOUND VAC AND WILL  PROVIDE [facility] TO FOLLOW UP.  AWAIT APPROVAL AND DELIVERY OF HOME VAC.  06/17/13 Evah Rashid,RN,BSN [phone] WOUND VAC PLACED TODAY; WILL NEED INSURANCE AUTHORIZATION PRIOR TO OBTAINING WOUND VAC.  NEED MD OR PA SIGNATURE ON WOUND VAC INSURANCE AUTH FORMS LOCATED IN PT CHART IN [place].  WILL COMPLETE ONCE SIGNED AND  FAX TO [facility] FOR PROCESS.  ANTICIPATED DC DATE IS FRIDAY.

## 2013-06-12 LAB — CBC
HCT: 30.8 % — ABNORMAL LOW (ref 39.0–52.0)
MCH: 31.8 pg (ref 26.0–34.0)
MCHC: 34.7 g/dL (ref 30.0–36.0)
RDW: 14.5 % (ref 11.5–15.5)

## 2013-06-12 LAB — WOUND CULTURE: Gram Stain: NONE SEEN

## 2013-06-12 LAB — BASIC METABOLIC PANEL
BUN: 20 mg/dL (ref 6–23)
Creatinine, Ser: 1.39 mg/dL — ABNORMAL HIGH (ref 0.50–1.35)
GFR calc Af Amer: 58 mL/min — ABNORMAL LOW (ref >90)
GFR calc non Af Amer: 50 mL/min — ABNORMAL LOW (ref >90)
Glucose, Bld: 91 mg/dL (ref 70–99)

## 2013-06-12 NOTE — Progress Notes (Signed)
Patient ID: [name], male   DOB: Jun 09, 1946, 67 y.o.   MRN: [id number] Vascular Surgery Progress Note  Subjective: To the PACU 8 hours. Cultures revealed no staph aureus but gram-positive cocci in pairs. Continues on thank and Zosyn. Remains afebrile.  Objective:  Filed Vitals:   06/12/13 1330  BP: 124/49  Pulse: 55  Temp: 97.7 F (36.5 C)  Resp: 18    And examined and continues to slowly clean with moist saline gauze dressing changes q. 8 hours. Left foot well-perfused   Labs:  Recent Labs Lab 06/09/13 1553 06/12/13 0620  CREATININE 1.24 1.39*    Recent Labs Lab 06/09/13 1553 06/12/13 0620  NA 131* 134*  K 4.0 4.8  CL 95* 97  CO2 24 27  BUN 20 20  CREATININE 1.24 1.39*  GLUCOSE 112* 91  CALCIUM 8.9 9.7    Recent Labs Lab 06/09/13 1553 06/12/13 0620  WBC 9.6 8.5  HGB 9.6* 10.7*  HCT 27.5* 30.8*  PLT 297 291    Recent Labs Lab 06/09/13 1553  INR 1.25    I/O last 3 completed shifts: In: 720 [P.O.:720] Out: 3200 [Urine:3200]  Imaging: No results found.  Assessment/Plan:    LOS: 3 days  s/p   can continue wound packing Q8 hours with more saline gauze Hopefully will get clean enough by next week to discharge patient with VAC in place Josephina Gip, MD 06/12/2013 2:21 PM

## 2013-06-12 NOTE — Progress Notes (Signed)
Pt. Wet to dry dressing change completed of left groin at 0300 per pt. Request due to adjusted schedule day before. Pt. Premedicated and tolerated well. Will continue to monitor.   Thane Edu, RN

## 2013-06-12 NOTE — Consult Note (Signed)
ANTIBIOTIC CONSULT NOTE - Follow-Up  Pharmacy Consult for Vancomycin/Zosyn Indication: infected groin wound  No Known Allergies  Patient Measurements: Height: 5' 8.5" (174 cm) Weight: 186 lb 1.1 oz (84.4 kg) IBW/kg (Calculated) : 69.55   Vital Signs: Temp: 98 F (36.7 C) (09/05 0504) Temp src: Oral (09/05 0504) BP: 125/59 mmHg (09/05 0504) Pulse Rate: 53 (09/05 0504) Intake/Output from previous day: 09/04 0701 - 09/05 0700 In: 720 [P.O.:720] Out: 1950 [Urine:1950] Intake/Output from this shift: Total I/O In: 240 [P.O.:240] Out: 250 [Urine:250]  Labs:  Recent Labs  June 21, 2013 1553 06/12/13 0620  WBC 9.6 8.5  HGB 9.6* 10.7*  PLT 297 291  CREATININE 1.24 1.39*   Estimated Creatinine Clearance: 53.6 ml/min (by C-G formula based on Cr of 1.39).  Medical History: Past Medical History  Diagnosis Date  . Hyperlipidemia   . Hypertension   . PVD (peripheral vascular disease)     prior extensive endarterectomy of the right external iliac, common femoral bypass, prior R CEA. R iliac disease noted during 03/2013 cath.  . Ischemic leg 02-27-08    with extensive endarterectomy  . Chronic systolic CHF (congestive heart failure)     a. EF 35% by cath 02/27/12. b. EF 25-30% by echo 03/2013.  Marland Kitchen Anemia     Instructed 27-Feb-2012 to f/u with PCP  . Nocturnal oxygen desaturation 02/27/12  . COPD (chronic obstructive pulmonary disease)     a. Suspected during admit 03/2013.  Marland Kitchen Atrial fibrillation     a. Transient during 03/2013 admission, started on apixaban/amiodarone.  . Complication of anesthesia     Lungs filled up with fluid when he had "gas anesthesia."  . Anginal pain   . Ischemic cardiomyopathy   . CKD (chronic kidney disease), stage II   . PAD (peripheral artery disease)   . Pneumonia     "haven't had it since I was in my late 02/27/23" (06-21-13)  . Rheumatic fever ~ 1947  . Exertional shortness of breath   . Peptic ulcer 1960's    "tx'd w/RX; gone in ~ 3 months; didn't come back"  (21-Jun-2013)  . Migraines     "stopped after sudden death episode in 02-27-1988" (June 21, 2013)  . Stroke 2008-02-27    "had temporary blindness", denies residual on 21-Jun-2013  . Arthritis     "spine, wrist" (06/21/13)  . Anxiety     "hx" (06-21-13)  . Depression     "hx" (June 21, 2013)  . Basal cell carcinoma of face 02/26/2010  . Coronary artery disease     a. anterior MI in 27-Feb-1988. b. MI with CABG in Feb 26, 1997. c. Small NSTEMI s/p DES to SVG-OM 2012-02-27, newly recognized LV dysfunction at that time. d. NSTEMI 03/2013: occlusion of OM branch after touchdown of SVG, likely the culprit, too small for PCI, for med rx.  . Myocardial infarction 1989/02/2221-May-2013; 03/2013    "might have been some small ones inbetween; those were the most serious" (2013-06-21)  . Anterior myocardial infarction 27-Feb-1988  . NSTEMI (non-ST elevated myocardial infarction) 2012/02/27  . STEMI (ST elevation myocardial infarction) 03/2013   Recent Results (from the past 240 hour(s))  WOUND CULTURE     Status: None   Collection Time    06/21/2013 10:30 AM      Result Value Range Status   Gram Stain No WBC Seen   Preliminary   Gram Stain No Squamous Epithelial Cells Seen   Preliminary   Gram Stain Few GRAM POSITIVE COCCI IN PAIRS   Preliminary  Preliminary Report Culture reincubated for better growth   Preliminary     Assessment: 67yom s/p left external iliac endarterectomy with external iliac to profunda femorous bypass graft on 05/21/13 now presents with an infected left groin wound. He was started on empiric vancomycin and zosyn.  Wound culture so far growing GPC.  Goal of Therapy:  Vancomycin trough level 10-15 mcg/ml  Plan:  1) Continue vancomycin 1500 mg IV q 24 hrs.   2) Continue Zosyn 3.375g IV q 8 hrs. 3) Will check vancomycin trough in 1-2 if cultures positive for MRSA. 4) Continue to monitor renal function.  Tad Moore, BCPS  Clinical Pharmacist Pager [phone]  06/12/2013 10:01 AM

## 2013-06-12 NOTE — Progress Notes (Signed)
Central monitoring informed the nurse that the patient experienced 6 beats of V Tach. The checked the patient who was asymptomatic. [name]

## 2013-06-13 NOTE — Progress Notes (Signed)
[facility]  Daily Progress Note  Assessment/Planning: s/p L EIA endarterectomy, EIA to PFA bypass with Dacron, complicated by left groin wound   Wound appears relatively clean without obvious graft exposure  Continue wet-to-dry dressing BID  Dr. Hart Rochester will consider VAC dressing once the wound cleans up further  Subjective    No complaints  Objective Filed Vitals:   06/12/13 1958 06/13/13 0451 06/13/13 0538 06/13/13 0632  BP: 137/52 149/58 155/69   Pulse: 62 60 54 56  Temp: 98.5 F (36.9 C) 98.7 F (37.1 C) 98.5 F (36.9 C)   TempSrc: Oral Oral Oral   Resp: 18 18 16    Height:      Weight:      SpO2: 95% 96% 95%     Intake/Output Summary (Last 24 hours) at 06/13/13 0808 Last data filed at 06/13/13 0503  Gross per 24 hour  Intake    480 ml  Output   2001 ml  Net  -1521 ml    PULM  CTAB CV  RRR GI  soft, NTND VASC  L foot viable and warm, L groin: deep subcutaneous sutures intact, no graft exposed, some fibrinous exudate with some superficial fat necrosis  Laboratory CBC    Component Value Date/Time   WBC 8.5 06/12/2013 0620   HGB 10.7* 06/12/2013 0620   HCT 30.8* 06/12/2013 0620   PLT 291 06/12/2013 0620    BMET    Component Value Date/Time   NA 134* 06/12/2013 0620   K 4.8 06/12/2013 0620   CL 97 06/12/2013 0620   CO2 27 06/12/2013 0620   GLUCOSE 91 06/12/2013 0620   BUN 20 06/12/2013 0620   CREATININE 1.39* 06/12/2013 0620   CALCIUM 9.7 06/12/2013 0620   GFRNONAA 50* 06/12/2013 0620   GFRAA 58* 06/12/2013 0620    Leonides Sake, MD [facility] [place] Office: [phone] Pager: [phone]  06/13/2013, 8:08 AM

## 2013-06-13 NOTE — Progress Notes (Signed)
   Subjective:  The patient feels well this a.m. denies chest pain or shortness of breath Telemetry shows normal sinus rhythm  Objective:  Vital Signs in the last 24 hours: Temp:  [97.7 F (36.5 C)-98.7 F (37.1 C)] 98.5 F (36.9 C) (09/06 0538) Pulse Rate:  [54-62] 56 (09/06 0632) Resp:  [16-18] 16 (09/06 0538) BP: (124-155)/(49-70) 155/69 mmHg (09/06 0538) SpO2:  [95 %-96 %] 95 % (09/06 0538)  Intake/Output from previous day: 09/05 0701 - 09/06 0700 In: 840 [P.O.:840] Out: 2251 [Urine:2250; Stool:1] Intake/Output from this shift:    . amiodarone  200 mg Oral Daily  . amLODipine  5 mg Oral Daily  . apixaban  5 mg Oral BID  . aspirin EC  81 mg Oral Daily  . atorvastatin  80 mg Oral QPM  . carvedilol  6.25 mg Oral BID WC  . clobetasol cream   Topical BID  . enalapril  5 mg Oral BID  . feeding supplement  237 mL Oral BID BM  . furosemide  40 mg Oral Daily  . isosorbide mononitrate  30 mg Oral Daily  . loratadine  10 mg Oral QPM  . metoCLOPramide  5 mg Oral TID AC & HS  . piperacillin-tazobactam (ZOSYN)  IV  3.375 g Intravenous Q8H  . senna  1 tablet Oral BID  . sodium chloride  3 mL Intravenous Q12H  . tamsulosin  0.4 mg Oral QPC supper  . vancomycin  1,500 mg Intravenous Q24H      Physical Exam: The patient appears to be in no distress.  Chest is clear to percussion and auscultation.  No rales or rhonchi.  Expansion of the chest is symmetrical.  Heart reveals no abnormal lift or heave.  First and second heart sounds are normal.  There is no gallop rub or click.  Soft systolic murmur at lower left sternal edge.  The abdomen is soft and nontender.  Bowel sounds are normoactive.  There is no hepatosplenomegaly or mass.  There are no abdominal bruits.   Lab Results:  Recent Labs  06/12/13 0620  WBC 8.5  HGB 10.7*  PLT 291    Recent Labs  06/12/13 0620  NA 134*  K 4.8  CL 97  CO2 27  GLUCOSE 91  BUN 20  CREATININE 1.39*   No results found for this  basename: TROPONINI, CK, MB,  in the last 72 hours Hepatic Function Panel No results found for this basename: PROT, ALBUMIN, AST, ALT, ALKPHOS, BILITOT, BILIDIR, IBILI,  in the last 72 hours No results found for this basename: CHOL,  in the last 72 hours No results found for this basename: PROTIME,  in the last 72 hours  Imaging: No results found.  Cardiac Studies: Telemetry shows normal sinus rhythm Assessment/Plan:  1. Syncope: he has not had any significant arrhythmias since being admitted. He had already decreased his amio to 200 daily. Continue current meds.  2. Chronic systolic CHF: EF 16% on last echo. He is on a good medical regimen.  He appears to be euvolemic Continue current Coreg, enalapril, and po Lasix.  3. CKD:  4. Right groin surgical site infection: s/p external iliac to profunda femoris graft. Per vascular.  5. Paroxysmal atrial fibrillation: In NSR. Continue Eliquis and amiodarone.   Overall patient remains stable   LOS: 4 days    Cassell Clement 06/13/2013, 10:20 AM

## 2013-06-14 DIAGNOSIS — I2589 Other forms of chronic ischemic heart disease: Secondary | ICD-10-CM

## 2013-06-14 NOTE — Progress Notes (Signed)
Subjective:  The patient feels well this a.m. denies chest pain or shortness of breath Telemetry shows normal sinus rhythm with NSVT  Objective:  Vital Signs in the last 24 hours: Temp:  [98 F (36.7 C)-98.5 F (36.9 C)] 98.4 F (36.9 C) (09/07 0516) Pulse Rate:  [55-63] 58 (09/07 0516) Resp:  [16] 16 (09/07 0516) BP: (141-157)/(67-74) 157/74 mmHg (09/07 0516) SpO2:  [93 %-95 %] 95 % (09/07 0516)  Intake/Output from previous day: 09/06 0701 - 09/07 0700 In: 360 [P.O.:360] Out: 1675 [Urine:1675] Intake/Output from this shift: Total I/O In: -  Out: 600 [Urine:600]  . amiodarone  200 mg Oral Daily  . amLODipine  5 mg Oral Daily  . apixaban  5 mg Oral BID  . aspirin EC  81 mg Oral Daily  . atorvastatin  80 mg Oral QPM  . carvedilol  6.25 mg Oral BID WC  . clobetasol cream   Topical BID  . enalapril  5 mg Oral BID  . feeding supplement  237 mL Oral BID BM  . furosemide  40 mg Oral Daily  . isosorbide mononitrate  30 mg Oral Daily  . loratadine  10 mg Oral QPM  . metoCLOPramide  5 mg Oral TID AC & HS  . piperacillin-tazobactam (ZOSYN)  IV  3.375 g Intravenous Q8H  . senna  1 tablet Oral BID  . sodium chloride  3 mL Intravenous Q12H  . tamsulosin  0.4 mg Oral QPC supper  . vancomycin  1,500 mg Intravenous Q24H      Physical Exam: The patient appears to be in no distress. OP is clear Chest is clear to percussion and auscultation.  No rales or rhonchi.  Expansion of the chest is symmetrical.  Heart reveals no abnormal lift or heave.  First and second heart sounds are normal.  There is no gallop rub or click.  Soft systolic murmur at lower left sternal edge.  The abdomen is soft and nontender.  Bowel sounds are normoactive.  There is no hepatosplenomegaly or mass.  There are no abdominal bruits. L femoral wound has dressing in place   Lab Results:  Recent Labs  06/12/13 0620  WBC 8.5  HGB 10.7*  PLT 291    Recent Labs  06/12/13 0620  NA 134*  K 4.8  CL  97  CO2 27  GLUCOSE 91  BUN 20  CREATININE 1.39*   Cardiac Studies: Telemetry shows normal sinus rhythm  Assessment/Plan:  1. Syncope:  The patient has an ischemic CM (EF 35%) despite ace inhibitor and beta blocker use.  He is admitted with abrupt syncope which I am concerned is arrhythmic in nature.  He has had NSVT while here.  I think that strong consideration should be given to ICD implantation, though presently our options are limited due to L inguinal infection.  He anticipates having to wear a wound vac.  It may be that LifeVest prior to discharge is the most appropriate intervention.   I will speak with Dr Graciela Husbands about the possibility of a subQ ICD for this patient down the road.  No driving x 6 months.  Will follow closely on telemetry while here.  2. Chronic systolic CHF: EF 29% on last echo. He is on a good medical regimen.  He appears to be euvolemic Continue current Coreg, enalapril, and po Lasix.   3. CKD:   4. Left groin surgical site infection: s/p external iliac to profunda femoris graft. Per vascular.   5. Paroxysmal  atrial fibrillation: In NSR. Continue Eliquis and amiodarone.   Overall patient remains stable   LOS: 5 days    [name] 06/14/2013, 11:15 AM

## 2013-06-14 NOTE — Progress Notes (Addendum)
[facility] Progress Note  06/14/2013 7:50 AM HD 5  Subjective:  No complaints  Afebrile VSS 95% RA  Filed Vitals:   06/14/13 0516  BP: 157/74  Pulse: 58  Temp: 98.4 F (36.9 C)  Resp: 16    Physical Exam: Cardiac:  regular Lungs:  Non labored Extremities:  Left groin wound unchanged from yesterday's exam.  CBC    Component Value Date/Time   WBC 8.5 06/12/2013 0620   RBC 3.36* 06/12/2013 0620   RBC 2.83* 05/26/2013 0500   HGB 10.7* 06/12/2013 0620   HCT 30.8* 06/12/2013 0620   PLT 291 06/12/2013 0620   MCV 91.7 06/12/2013 0620   MCH 31.8 06/12/2013 0620   MCHC 34.7 06/12/2013 0620   RDW 14.5 06/12/2013 0620   LYMPHSABS 1.6 03/21/2013 1833   MONOABS 1.0 03/21/2013 1833   EOSABS 0.4 03/21/2013 1833   BASOSABS 0.0 03/21/2013 1833    BMET    Component Value Date/Time   NA 134* 06/12/2013 0620   K 4.8 06/12/2013 0620   CL 97 06/12/2013 0620   CO2 27 06/12/2013 0620   GLUCOSE 91 06/12/2013 0620   BUN 20 06/12/2013 0620   CREATININE 1.39* 06/12/2013 0620   CALCIUM 9.7 06/12/2013 0620   GFRNONAA 50* 06/12/2013 0620   GFRAA 58* 06/12/2013 0620    INR    Component Value Date/Time   INR 1.25 06/09/2013 1553     Intake/Output Summary (Last 24 hours) at 06/14/13 0750 Last data filed at 06/14/13 0515  Gross per 24 hour  Intake    240 ml  Output   1675 ml  Net  -1435 ml     Assessment/Plan:  67 y.o. male is  Here s/p L EIA endarterectomy, EIA to PFA bypass with Dacron, complicated by left groin wound HD 5  -wound unchanged from yesterday.  Will continue wet to dry dressing changes every 8 hours. -once wound has improved and cleaned up further-Dr. Hart Rochester may consider wound vac placement. -continue ABx  Doreatha Massed, PA-C [facility] [phone] 06/14/2013 7:50 AM  Addendum  I have independently interviewed and examined the patient, and I agree with the physician assistant's findings.  Left groin unchg from yesterday, graft still not visible  Leonides Sake, MD [facility] [place] Office: [phone] Pager: [phone]  06/14/2013, 8:26 AM

## 2013-06-15 DIAGNOSIS — T8149XA Infection following a procedure, other surgical site, initial encounter: Secondary | ICD-10-CM

## 2013-06-15 NOTE — Progress Notes (Signed)
Pt up ambulating in hallway independently at this time without any difficulty; will cont. To monitor.

## 2013-06-15 NOTE — Progress Notes (Signed)
Patient Name: [name] Date of Encounter: 06/15/2013  Principal Problem:   Surgical wound infection Active Problems:   Syncope   Malnutrition of moderate degree    SUBJECTIVE: Doing better, no chest pain, SOB or palpitations. Believes his syncope was caused by amiodarone and does not want to consider a LifeVest. He is aware that his syncope may have been from a life-threatening arrhythmia. He may be willing to consider a defibrillator at some point, will f/u as an outpatient.  OBJECTIVE Filed Vitals:   06/14/13 0516 06/14/13 1358 06/14/13 2034 06/15/13 0421  BP: 157/74 143/71 115/64 151/70  Pulse: 58 58 63 63  Temp: 98.4 F (36.9 C) 97.9 F (36.6 C) 98.5 F (36.9 C) 98.5 F (36.9 C)  TempSrc: Oral Oral Oral Oral  Resp: 16 16 16 16   Height:      Weight:      SpO2: 95% 97% 94% 94%    Intake/Output Summary (Last 24 hours) at 06/15/13 0933 Last data filed at 06/15/13 0900  Gross per 24 hour  Intake    650 ml  Output   1250 ml  Net   -600 ml   Filed Weights   06/09/13 1130  Weight: 186 lb 1.1 oz (84.4 kg)    PHYSICAL EXAM General: Well developed, well nourished, male in no acute distress. Head: Normocephalic, atraumatic.  Neck: Supple without bruits, JVD not elevated. Lungs:  Resp regular and unlabored, few rales bases.Marland Kitchen Heart: RRR, S1, S2, no S3, S4, 2/6 murmur; no rub. Abdomen: Soft, non-tender, non-distended, BS + x 4.  Extremities: No clubbing, cyanosis, no edema.  Neuro: Alert and oriented X 3. Moves all extremities spontaneously. Psych: Normal affect.  LABS: none since 9/5  TELE:  SR, sinus brady into the high 40s while asleep. 5 bt run NSVT yesterday, rare PVCs otherwise no ectopy.   Current Medications:  . amiodarone  200 mg Oral Daily  . amLODipine  5 mg Oral Daily  . apixaban  5 mg Oral BID  . aspirin EC  81 mg Oral Daily  . atorvastatin  80 mg Oral QPM  . carvedilol  6.25 mg Oral BID WC  . clobetasol cream   Topical BID  . enalapril  5 mg  Oral BID  . feeding supplement  237 mL Oral BID BM  . furosemide  40 mg Oral Daily  . isosorbide mononitrate  30 mg Oral Daily  . loratadine  10 mg Oral QPM  . metoCLOPramide  5 mg Oral TID AC & HS  . piperacillin-tazobactam (ZOSYN)  IV  3.375 g Intravenous Q8H  . senna  1 tablet Oral BID  . sodium chloride  3 mL Intravenous Q12H  . tamsulosin  0.4 mg Oral QPC supper  . vancomycin  1,500 mg Intravenous Q24H      ASSESSMENT AND PLAN: Principal Problem:   Surgical wound infection - per [facility]. Pt states he may be ready for d/c with wound vac on Friday.  Active Problems:   Syncope - no critical arrhythmia on tele. His EF is 35%. He does not wish to consider a LifeVest. 20 minutes spent talking to patient about the possibility his syncope was from a life-threatening arrhythmia. He believes it was the amiodarone and does not wish to get a LifeVest. He understands the risk, and accepts it. He may be willing to consider an ICD as outpatient once the infection has resolved. He is willing to continue amiodarone at the current dose. Not able to  increase BB secondary to bradycardia. Has f/u appt with MD scheduled.     HTN - SBP higher than desired most of the time. Will recheck BMET in am, if RF stable, MD advise on increasing enalapril 5 mg BID (home dose).  Med changes per MD.     Malnutrition of moderate degree - Albumin 3.0 last check 9/2, per VVS   Signed, Theodore Demark , PA-C 9:33 AM 06/15/2013   Attending Note:   The patient was seen and examined.  Agree with assessment and plan as noted above.  Changes made to the above note as needed.  I talked with him further this am.  He now agrees to wear a LifeVest.  Will notify Dennis Bast, RN who does the LifeVest fitting for [place].    He is stable from a cardiac standpoint.   Vesta Mixer, Montez Hageman., MD, [facility] 06/15/2013, 11:22 AM

## 2013-06-15 NOTE — Progress Notes (Signed)
Patient ID: [name], male   DOB: August 08, 1946, 67 y.o.   MRN: [id number] Vascular Surgery Progress Note  Subjective: Patient reports decreased pain in left groin with dressing changes. No chills and fever. Moist saline gauze changes being done every 8 hours.  Objective:  Filed Vitals:   06/15/13 1335  BP: 146/68  Pulse: 59  Temp: 97.7 F (36.5 C)  Resp: 16    General alert and oriented x3 in no apparent stress Left inguinal region examined. Some fat necrosis debrided sharply. Deep level of closure remains intact with no exposed Dacron graft. Left foot adequately perfused   Labs:  Recent Labs Lab 06/09/13 1553 06/12/13 0620  CREATININE 1.24 1.39*    Recent Labs Lab 06/09/13 1553 06/12/13 0620  NA 131* 134*  K 4.0 4.8  CL 95* 97  CO2 24 27  BUN 20 20  CREATININE 1.24 1.39*  GLUCOSE 112* 91  CALCIUM 8.9 9.7    Recent Labs Lab 06/09/13 1553 06/12/13 0620  WBC 9.6 8.5  HGB 9.6* 10.7*  HCT 27.5* 30.8*  PLT 297 291    Recent Labs Lab 06/09/13 1553  INR 1.25    I/O last 3 completed shifts: In: 410 [P.O.:360; IV Piggyback:50] Out: 2925 [Urine:2925]  Imaging: No results found.  Assessment/Plan:   LOS: 6 days  s/p   Final culture result-no dominant organism-mixed flora-no staph aureus or strep pyogenes isolated Will continue IV biotics until patient discharged because of risk of synthetic graft infection  Hopefully we will be clean enough in a few days to apply VAC and then we'll examine a few days later and hopefully DC home by end of week   Josephina Gip, MD 06/15/2013 1:52 PM

## 2013-06-15 NOTE — Progress Notes (Signed)
Pt given Albuterol inhaler per request at this time; will cont. To monitor.

## 2013-06-15 NOTE — Plan of Care (Signed)
Problem: Phase III Progression Outcomes Goal: IV/normal saline lock discontinued Outcome: Not Met (add Reason) Patient continues to receive IV antibiotic therapy.

## 2013-06-15 NOTE — Consult Note (Signed)
ANTIBIOTIC CONSULT NOTE - Follow-Up  Pharmacy Consult for Vancomycin/Zosyn Indication: infected groin wound  No Known Allergies  Patient Measurements: Height: 5' 8.5" (174 cm) Weight: 186 lb 1.1 oz (84.4 kg) IBW/kg (Calculated) : 69.55  Vital Signs: Temp: 97.7 F (36.5 C) (09/08 1335) Temp src: Oral (09/08 1335) BP: 146/68 mmHg (09/08 1335) Pulse Rate: 59 (09/08 1335) Intake/Output from previous day: 09/07 0701 - 09/08 0700 In: 290 [P.O.:240; IV Piggyback:50] Out: 1450 [Urine:1450] Intake/Output from this shift: Total I/O In: 1100 [P.O.:600; IV Piggyback:500] Out: 700 [Urine:700]  Labs: No results found for this basename: WBC, HGB, PLT, LABCREA, CREATININE,  in the last 72 hours Estimated Creatinine Clearance: 53.6 ml/min (by C-G formula based on Cr of 1.39).  Recent Results (from the past 240 hour(s))  WOUND CULTURE     Status: None   Collection Time    06/09/13 10:30 AM      Result Value Range Status   Gram Stain No WBC Seen   Final   Gram Stain No Squamous Epithelial Cells Seen   Final   Gram Stain Few GRAM POSITIVE COCCI IN PAIRS   Final   Organism ID, Bacteria Multiple Organisms Present,None Predominant   Final   Comment: No Staphylococcus aureus isolated     NO GROUP A STREP (S. PYOGENES) ISOLATED    Assessment: 67yom s/p left external iliac endarterectomy with external iliac to profunda femorous bypass graft on 05/21/13 now presents with an infected left groin wound. He was started on empiric vancomycin and zosyn.  Wound culture grew mixed flora with no staph aureus or strep pyogenes isolated- per vasc surgery, to continue IV abx until d/c as his risk for graft infection is high. No new labs since 9/5.  Goal of Therapy:  Vancomycin trough level 10-15 mcg/ml  Plan:  1) Continue vancomycin 1500 mg IV q 24 hrs.   2) Continue Zosyn 3.375g IV q 8 hrs infused over 4 hours 3) Continue to monitor renal function 4) VT if indicated  Kristal Perl D. Johaan Ryser,  PharmD Clinical Pharmacist Pager: [phone] 06/15/2013 2:55 PM

## 2013-06-16 LAB — BASIC METABOLIC PANEL
BUN: 20 mg/dL (ref 6–23)
Calcium: 9.4 mg/dL (ref 8.4–10.5)
GFR calc Af Amer: 72 mL/min — ABNORMAL LOW (ref >90)
GFR calc non Af Amer: 62 mL/min — ABNORMAL LOW (ref >90)
Glucose, Bld: 99 mg/dL (ref 70–99)
Potassium: 4 mEq/L (ref 3.5–5.1)
Sodium: 132 mEq/L — ABNORMAL LOW (ref 135–145)

## 2013-06-16 MED ORDER — CIPROFLOXACIN IN D5W 400 MG/200ML IV SOLN
400.0000 mg | Freq: Two times a day (BID) | INTRAVENOUS | Status: DC
  Administered 2013-06-16 – 2013-06-18 (×6): 400 mg via INTRAVENOUS
  Filled 2013-06-16 (×9): qty 200

## 2013-06-16 NOTE — Progress Notes (Signed)
NUTRITION FOLLOW UP  Intervention:   1. D/c Ensure, pt is refusing 2. Magic cup once daily with supper, each supplement provides 290 kcal and 9 grams of protein.  Nutrition Dx:   Inadequate oral intake related to n/v as evidenced by weight loss.  Goal:   PO intake to meet >/=90% estimated nutrition needs  Monitor:   PO intake, weight trends, supplement tolerance  Assessment:   Groin site improving. Planned for vac placement and possible d/c by the end of the week.    Appetite is improved but pt does not like the meals provided. When he doesn't like the meals he does not eat. Meal completion 25-100%. Does not like Ensure supplements. Willing to try magic cup.   Height: Ht Readings from Last 1 Encounters:  06/09/13 5' 8.5" (1.74 m)    Weight Status:   Wt Readings from Last 1 Encounters:  06/09/13 186 lb 1.1 oz (84.4 kg)    Re-estimated needs:  Kcal: 1900-2100  Protein: 85-95 gm  Fluid: 1.9-2.1 L   Skin: groin incision  Diet Order: Cardiac   Intake/Output Summary (Last 24 hours) at 06/16/13 1508 Last data filed at 06/16/13 1443  Gross per 24 hour  Intake   1290 ml  Output   3050 ml  Net  -1760 ml    Last BM: 9/9   Labs:   Recent Labs Lab 06/09/13 1553 06/12/13 0620 06/16/13 0452  NA 131* 134* 132*  K 4.0 4.8 4.0  CL 95* 97 95*  CO2 24 27 27   BUN 20 20 20   CREATININE 1.24 1.39* 1.16  CALCIUM 8.9 9.7 9.4  GLUCOSE 112* 91 99    CBG (last 3)  No results found for this basename: GLUCAP,  in the last 72 hours  Scheduled Meds: . amiodarone  200 mg Oral Daily  . amLODipine  5 mg Oral Daily  . apixaban  5 mg Oral BID  . aspirin EC  81 mg Oral Daily  . atorvastatin  80 mg Oral QPM  . carvedilol  6.25 mg Oral BID WC  . ciprofloxacin  400 mg Intravenous Q12H  . clobetasol cream   Topical BID  . enalapril  5 mg Oral BID  . feeding supplement  237 mL Oral BID BM  . furosemide  40 mg Oral Daily  . isosorbide mononitrate  30 mg Oral Daily  .  loratadine  10 mg Oral QPM  . metoCLOPramide  5 mg Oral TID AC & HS  . senna  1 tablet Oral BID  . sodium chloride  3 mL Intravenous Q12H  . tamsulosin  0.4 mg Oral QPC supper    Continuous Infusions:   Clarene Duke RD, LDN Pager [phone] After Hours pager [phone]

## 2013-06-17 MED ORDER — ENALAPRIL MALEATE 10 MG PO TABS
10.0000 mg | ORAL_TABLET | Freq: Two times a day (BID) | ORAL | Status: DC
  Administered 2013-06-17: 10 mg via ORAL
  Filled 2013-06-17 (×3): qty 1

## 2013-06-17 NOTE — Consult Note (Signed)
WOC consult Note Reason for Consult: Consult requested to apply vac dressing to left groin.  Pt is familiar to [facility] team from previous admission and has used a Vac to another wound in the past which healed. Wound type: Full thickness post-op wound to left groin. Measurement:6X3.5X1cm with undermining to 1 cm from 7:00 o'clock to 10:00 o'clock Wound bed:15% yellow, 85% red Drainage (amount, consistency, odor) small amt yellow drainage, no odor Periwound: Intact skin to wound edges Dressing procedure/placement/frequency: Applied Mepitel contact layer and one piece black sponge to 100cm cont suction.  Pt tolerated with minimal amt discomfort after pain meds given earlier.  Paste strip applied in skin fold to help maintain seal. Plan dressing change Friday after Dr Alford Highland wound, according to progress notes. Cammie Mcgee MSN, RN, CWOCN, [place], CNS [phone]

## 2013-06-17 NOTE — Progress Notes (Signed)
Patient ID: [name], male   DOB: March 12, 1946, 67 y.o.   MRN: [id number] Vascular Surgery Progress Note  Subjective: Patient states left inguinal wound with discomfort decreased. Denies any chills and fever. Has been ambulating during the day between dressing changes. Remains on IV Cipro. When cultures no predominant organism-no staph or strep Pyogenes  Objective:  Filed Vitals:   06/17/13 0811  BP: 155/53  Pulse: 66  Temp:   Resp:     General alert and oriented x3 Left inguinal wound examined. Granulation tissue on the lateral aspect of wound. No bruits. No graft exposed.   Labs:  Recent Labs Lab 06/12/13 0620 06/16/13 0452  CREATININE 1.39* 1.16    Recent Labs Lab 06/12/13 0620 06/16/13 0452  NA 134* 132*  K 4.8 4.0  CL 97 95*  CO2 27 27  BUN 20 20  CREATININE 1.39* 1.16  GLUCOSE 91 99  CALCIUM 9.7 9.4    Recent Labs Lab 06/12/13 0620  WBC 8.5  HGB 10.7*  HCT 30.8*  PLT 291   No results found for this basename: INR,  in the last 168 hours  I/O last 3 completed shifts: In: 940 [P.O.:600; I.V.:240; IV Piggyback:100] Out: 3500 [Urine:3500]  Imaging: No results found.  Assessment/Plan:   LOS: 8 days  s/p   Plan placement of wound VAC today. Continue IV Cipro. Will examine wound on Friday and change the a.c. and discharge home if wound looks satisfactory   Josephina Gip, MD 06/17/2013 8:17 AM

## 2013-06-17 NOTE — Progress Notes (Signed)
Pt up ambulating in hallway independently at this time; no needs voiced; wound van in place; will cont. To monitor.

## 2013-06-17 NOTE — Progress Notes (Addendum)
   Patient Name: [name] Date of Encounter: 06/17/2013  Principal Problem:   Surgical wound infection Active Problems:   HTN (hypertension)   Syncope   Malnutrition of moderate degree    SUBJECTIVE: Doing better, no chest pain, SOB or palpitations.   OBJECTIVE Filed Vitals:   06/17/13 0726 06/17/13 0730 06/17/13 0811 06/17/13 0900  BP:  155/53 155/53   Pulse: 64 66 66   Temp:    99.1 F (37.3 C)  TempSrc:    Oral  Resp:      Height:      Weight:      SpO2:  92%      Intake/Output Summary (Last 24 hours) at 06/17/13 1014 Last data filed at 06/17/13 0846  Gross per 24 hour  Intake    720 ml  Output   2650 ml  Net  -1930 ml   Filed Weights   06/09/13 1130  Weight: 186 lb 1.1 oz (84.4 kg)    PHYSICAL EXAM General: Well developed, well nourished, male in no acute distress. Head: Normocephalic, atraumatic.  Neck: Supple without bruits, JVD not elevated. Lungs:  Resp regular and unlabored, few rales bases.Marland Kitchen Heart: RRR, S1, S2, no S3, S4, 2/6 murmur; no rub. Abdomen: Soft, non-tender, non-distended, BS + x 4.  Extremities: No clubbing, cyanosis, no edema.  Neuro: Alert and oriented X 3. Moves all extremities spontaneously. Psych: Normal affect.  LABS: none since 9/5  TELE:  SR, sinus brady into the high 40s while asleep. 5 bt run NSVT yesterday, rare PVCs otherwise no ectopy.   Current Medications:  . amiodarone  200 mg Oral Daily  . amLODipine  5 mg Oral Daily  . apixaban  5 mg Oral BID  . aspirin EC  81 mg Oral Daily  . atorvastatin  80 mg Oral QPM  . carvedilol  6.25 mg Oral BID WC  . ciprofloxacin  400 mg Intravenous Q12H  . clobetasol cream   Topical BID  . enalapril  10 mg Oral BID  . furosemide  40 mg Oral Daily  . isosorbide mononitrate  30 mg Oral Daily  . loratadine  10 mg Oral QPM  . metoCLOPramide  5 mg Oral TID AC & HS  . senna  1 tablet Oral BID  . sodium chloride  3 mL Intravenous Q12H  . tamsulosin  0.4 mg Oral QPC supper       ASSESSMENT AND PLAN:  1. CHF :  bp is mildly elevated.  Will increase his enalapril to 10 bid.  continue coreg.   2. Atrial fib_  Continue amiodarone.  He remains in NSR  3. CAD:  Stable  4. PVD:  S/p surgery.  Home on Friday  5. Syncope:  He has an EF of 35%.  Will place a Lifevest when he is discharged.  He will follow up with EP/

## 2013-06-17 NOTE — Progress Notes (Signed)
Pt up ambulating in hallway at this time; no needs voiced; will cont. To monitor. 

## 2013-06-17 NOTE — Progress Notes (Signed)
[facility] Progress Note  06/17/2013 7:45 AM HD 8  Subjective:  Pt states he is feeling better. Refused dressing change until Dr. Hart Rochester comes in.  Afebrile VSS  Filed Vitals:   06/17/13 0726  BP:   Pulse: 64  Temp:   Resp:      CBC    Component Value Date/Time   WBC 8.5 06/12/2013 0620   RBC 3.36* 06/12/2013 0620   RBC 2.83* 05/26/2013 0500   HGB 10.7* 06/12/2013 0620   HCT 30.8* 06/12/2013 0620   PLT 291 06/12/2013 0620   MCV 91.7 06/12/2013 0620   MCH 31.8 06/12/2013 0620   MCHC 34.7 06/12/2013 0620   RDW 14.5 06/12/2013 0620   LYMPHSABS 1.6 03/21/2013 1833   MONOABS 1.0 03/21/2013 1833   EOSABS 0.4 03/21/2013 1833   BASOSABS 0.0 03/21/2013 1833    BMET    Component Value Date/Time   NA 132* 06/16/2013 0452   K 4.0 06/16/2013 0452   CL 95* 06/16/2013 0452   CO2 27 06/16/2013 0452   GLUCOSE 99 06/16/2013 0452   BUN 20 06/16/2013 0452   CREATININE 1.16 06/16/2013 0452   CALCIUM 9.4 06/16/2013 0452   GFRNONAA 62* 06/16/2013 0452   GFRAA 72* 06/16/2013 0452    INR    Component Value Date/Time   INR 1.25 06/09/2013 1553     Intake/Output Summary (Last 24 hours) at 06/17/13 0745 Last data filed at 06/17/13 0110  Gross per 24 hour  Intake    600 ml  Output   1950 ml  Net  -1350 ml     Assessment/Plan:  67 y.o. male is   s/p L EIA endarterectomy, EIA to PFA bypass with Dacron, complicated by left groin wound   HD 8  -unable to inspect wound as pt refused dressing change and wants to wait on Dr. Hart Rochester to change dressing.  Doreatha Massed, PA-C [facility] [phone] 06/17/2013 7:45 AM

## 2013-06-18 ENCOUNTER — Telehealth: Payer: Self-pay | Admitting: Vascular Surgery

## 2013-06-18 LAB — CBC
Hemoglobin: 9.5 g/dL — ABNORMAL LOW (ref 13.0–17.0)
MCH: 31.1 pg (ref 26.0–34.0)
MCHC: 34.2 g/dL (ref 30.0–36.0)
Platelets: 216 10*3/uL (ref 150–400)
RDW: 13.9 % (ref 11.5–15.5)

## 2013-06-18 MED ORDER — ENALAPRIL MALEATE 20 MG PO TABS
20.0000 mg | ORAL_TABLET | Freq: Two times a day (BID) | ORAL | Status: DC
  Administered 2013-06-18 – 2013-06-19 (×2): 20 mg via ORAL
  Filled 2013-06-18 (×3): qty 1

## 2013-06-18 MED ORDER — ENALAPRIL MALEATE 5 MG PO TABS: 20.0000 mg | ORAL_TABLET | Freq: Two times a day (BID) | ORAL | Status: DC

## 2013-06-18 MED ORDER — CIPROFLOXACIN HCL 500 MG PO TABS: 500.0000 mg | ORAL_TABLET | Freq: Two times a day (BID) | ORAL | Status: DC

## 2013-06-18 MED ORDER — CARVEDILOL 6.25 MG PO TABS: 6.2500 mg | ORAL_TABLET | Freq: Two times a day (BID) | ORAL | Status: DC

## 2013-06-18 MED ORDER — AMIODARONE HCL 200 MG PO TABS: 200.0000 mg | ORAL_TABLET | Freq: Every day | ORAL | Status: DC

## 2013-06-18 MED ORDER — OXYCODONE HCL 5 MG PO TABS: 5.0000 mg | ORAL_TABLET | Freq: Four times a day (QID) | ORAL | Status: DC | PRN

## 2013-06-18 NOTE — Progress Notes (Addendum)
[facility] Progress Note  06/18/2013 7:48 AM HD 9  Subjective:  "Just a little burning with the vac, which is normal"  Tm 99.1  VSS  Filed Vitals:   06/18/13 0505  BP: 166/59  Pulse: 77  Temp: 98.1 F (36.7 C)  Resp: 12    Physical Exam:  Lungs:  Non labored Extremities:  Left groin with wound vac in place with good seal.  CBC    Component Value Date/Time   WBC 9.3 06/18/2013 0545   RBC 3.05* 06/18/2013 0545   RBC 2.83* 05/26/2013 0500   HGB 9.5* 06/18/2013 0545   HCT 27.8* 06/18/2013 0545   PLT 216 06/18/2013 0545   MCV 91.1 06/18/2013 0545   MCH 31.1 06/18/2013 0545   MCHC 34.2 06/18/2013 0545   RDW 13.9 06/18/2013 0545   LYMPHSABS 1.6 03/21/2013 1833   MONOABS 1.0 03/21/2013 1833   EOSABS 0.4 03/21/2013 1833   BASOSABS 0.0 03/21/2013 1833    BMET    Component Value Date/Time   NA 132* 06/16/2013 0452   K 4.0 06/16/2013 0452   CL 95* 06/16/2013 0452   CO2 27 06/16/2013 0452   GLUCOSE 99 06/16/2013 0452   BUN 20 06/16/2013 0452   CREATININE 1.16 06/16/2013 0452   CALCIUM 9.4 06/16/2013 0452   GFRNONAA 62* 06/16/2013 0452   GFRAA 72* 06/16/2013 0452    INR    Component Value Date/Time   INR 1.25 06/09/2013 1553     Intake/Output Summary (Last 24 hours) at 06/18/13 0748 Last data filed at 06/18/13 9528  Gross per 24 hour  Intake   1280 ml  Output   2600 ml  Net  -1320 ml     Assessment/Plan:  67 y.o. male is   L EIA endarterectomy, EIA to PFA bypass with Dacron, complicated by left groin wound   HD 9  -continue with wound vac and IV cipro -if wound okay tomorrow, will d/c home with vac and po cipro -pt states wound care RN said to page her when we remove the vac and she will replace it at that time.  Doreatha Massed, PA-C [facility] [phone] 06/18/2013 7:48 AM  Plan to examine wound in am and likely DC home wit MWF VAC changes

## 2013-06-18 NOTE — Progress Notes (Signed)
   Patient Name: [name] Date of Encounter: 06/18/2013  Principal Problem:   Surgical wound infection Active Problems:   HTN (hypertension)   Syncope   Malnutrition of moderate degree    SUBJECTIVE: Doing better, no chest pain, SOB or palpitations.  .  Feeling better.  Looking forward to going home tomorrow.  Will get fitted for life vest tonight.   OBJECTIVE Filed Vitals:   06/17/13 1413 06/17/13 1619 06/17/13 2010 06/18/13 0505  BP: 132/56 115/67 146/65 166/59  Pulse: 56 66 69 77  Temp: 98.1 F (36.7 C)  98.3 F (36.8 C) 98.1 F (36.7 C)  TempSrc: Oral  Oral Oral  Resp: 18  16 12   Height:      Weight:      SpO2: 96% 95% 99% 93%    Intake/Output Summary (Last 24 hours) at 06/18/13 1021 Last data filed at 06/18/13 0700  Gross per 24 hour  Intake   1040 ml  Output   2500 ml  Net  -1460 ml   Filed Weights   06/09/13 1130  Weight: 186 lb 1.1 oz (84.4 kg)    PHYSICAL EXAM General: Well developed, well nourished, male in no acute distress. Head: Normocephalic, atraumatic.  Neck: Supple without bruits, JVD not elevated. Lungs:  Resp regular and unlabored, few rales bases.Marland Kitchen Heart: RRR, S1, S2, no S3, S4, 2/6 murmur; no rub. Abdomen: Soft, non-tender, non-distended, BS + x 4.  Extremities: No clubbing, cyanosis, no edema.  Neuro: Alert and oriented X 3. Moves all extremities spontaneously. Psych: Normal affect.  LABS: none since 9/5  TELE:  Sinus brady 58 Current Medications:  . amiodarone  200 mg Oral Daily  . amLODipine  5 mg Oral Daily  . apixaban  5 mg Oral BID  . aspirin EC  81 mg Oral Daily  . atorvastatin  80 mg Oral QPM  . carvedilol  6.25 mg Oral BID WC  . ciprofloxacin  400 mg Intravenous Q12H  . clobetasol cream   Topical BID  . enalapril  20 mg Oral BID  . furosemide  40 mg Oral Daily  . isosorbide mononitrate  30 mg Oral Daily  . loratadine  10 mg Oral QPM  . metoCLOPramide  5 mg Oral TID AC & HS  . senna  1 tablet Oral BID  . sodium  chloride  3 mL Intravenous Q12H  . tamsulosin  0.4 mg Oral QPC supper      ASSESSMENT AND PLAN:  1. CHF :  bp is mildly elevated.  Will increase his enalapril to 20 bid.  continue coreg.   2. Atrial fib_  Continue amiodarone.  He remains in NSR  3. CAD:  Stable  4. PVD:  S/p surgery.  Home on Friday  5. Syncope:  He has an EF of 35%.  Will place a Lifevest when he is discharged.  He will follow up with EP/

## 2013-06-18 NOTE — Telephone Encounter (Addendum)
Message copied by Rosalyn Charters on Thu Jun 18, 2013  1:16 PM ------      Message from: Marlowe Shores      Created: Thu Jun 18, 2013 11:19 AM       2 week F/U wound check - wound vac Hart Rochester ------  notified patient of post op on 07-14-13 at 9:15 am with dr.. Hart Rochester

## 2013-06-19 NOTE — Progress Notes (Signed)
   Patient Name: [name] Date of Encounter: 06/19/2013  Principal Problem:   Surgical wound infection Active Problems:   HTN (hypertension)   Syncope   Malnutrition of moderate degree    SUBJECTIVE: Doing better, no chest pain, SOB or palpitations.  .  Feeling better.  Looking forward to going home tomorrow.  Has been fitted for life vest.  OBJECTIVE Filed Vitals:   06/18/13 0505 06/18/13 1400 06/18/13 2035 06/19/13 0414  BP: 166/59 116/62 156/58 141/64  Pulse: 77 74 68 60  Temp: 98.1 F (36.7 C) 98.1 F (36.7 C) 98.6 F (37 C) 98 F (36.7 C)  TempSrc: Oral Oral Oral Oral  Resp: 12 16 17 17   Height:      Weight:      SpO2: 93% 96% 95% 96%    Intake/Output Summary (Last 24 hours) at 06/19/13 0828 Last data filed at 06/19/13 0416  Gross per 24 hour  Intake    240 ml  Output   1075 ml  Net   -835 ml   Filed Weights   06/09/13 1130  Weight: 186 lb 1.1 oz (84.4 kg)    PHYSICAL EXAM General: Well developed, well nourished, male in no acute distress. Head: Normocephalic, atraumatic.  Neck: Supple without bruits, JVD not elevated. Lungs:  Resp regular and unlabored, few rales bases.Marland Kitchen Heart: RRR, S1, S2, no S3, S4, 2/6 murmur; no rub. Abdomen: Soft, non-tender, non-distended, BS + x 4.  Extremities: No clubbing, cyanosis, no edema.  Neuro: Alert and oriented X 3. Moves all extremities spontaneously. Psych: Normal affect.  LABS: none since 9/5  TELE:  Sinus brady 58 Current Medications:  . amiodarone  200 mg Oral Daily  . amLODipine  5 mg Oral Daily  . apixaban  5 mg Oral BID  . aspirin EC  81 mg Oral Daily  . atorvastatin  80 mg Oral QPM  . carvedilol  6.25 mg Oral BID WC  . ciprofloxacin  400 mg Intravenous Q12H  . clobetasol cream   Topical BID  . enalapril  20 mg Oral BID  . furosemide  40 mg Oral Daily  . isosorbide mononitrate  30 mg Oral Daily  . loratadine  10 mg Oral QPM  . metoCLOPramide  5 mg Oral TID AC & HS  . senna  1 tablet Oral BID    . sodium chloride  3 mL Intravenous Q12H  . tamsulosin  0.4 mg Oral QPC supper      ASSESSMENT AND PLAN:  1. CHF :  bp slowly improving. Continue meds. Ok for DC today.  3. CAD:  Stable  4. PVD:  S/p surgery.  ? Home today  5. Syncope:  He has an EF of 35%.  Will place a Lifevest when he is discharged.  He will follow up with EP/   Vesta Mixer, Montez Hageman., MD, [facility] 06/19/2013, 8:32 AM Office - [phone] Pager [phone]

## 2013-06-19 NOTE — Progress Notes (Addendum)
  VASCULAR SURGERY BYPASS PROGRESS NOTE  Left groin wound infection 10 days IV antibiotics  SUBJECTIVE: Pt doing well. C/O some pain in left shoulder where he has clavicle fx. Left leg doing well  PHYSICAL EXAM: BP Readings from Last 3 Encounters:  06/19/13 141/64  06/09/13 151/63  06/03/13 122/64   Temp Readings from Last 3 Encounters:  06/19/13 98 F (36.7 C) Oral  06/09/13 98.3 F (36.8 C) Oral  06/03/13 98 F (36.7 C) Oral   Pulse Readings from Last 3 Encounters:  06/19/13 60  06/09/13 59  06/03/13 56   SpO2 Readings from Last 3 Encounters:  06/19/13 96%  06/09/13 96%  06/03/13 90%     Intake/Output Summary (Last 24 hours) at 06/19/13 1006 Last data filed at 06/19/13 0416  Gross per 24 hour  Intake    240 ml  Output   1075 ml  Net   -835 ml    Extremities: left Incisions clean, dry and granulating Wound vac off   ASSESSMENT: Left groin wound healing Continue Cipro PO at home for wound infection -broad coverage  PLAN: Wound Management: wound Vac change M,W,F Disposition:  Will  go :Home.  F/U 2 weeks in the office  Wound examined after VAC removed Some more debridement performed-no purulence noted Granulation tissue on lateral side of wound  We'll DC home on VAC changes 3 times a day and I will see in office in 10 days

## 2013-06-19 NOTE — Discharge Summary (Signed)
[facility] Discharge Summary   Patient ID:  [name] MRN: [id number] DOB/AGE: 69/31/1947 69 y.o.  Admit date: 06-22-2013 Discharge date: 06/19/2013 Surgeon:   Admission Diagnosis: Groin wound infection left  Discharge Diagnoses:  Groin wound infection left  Secondary Diagnoses: Past Medical History  Diagnosis Date  . Hyperlipidemia   . Hypertension   . PVD (peripheral vascular disease)     prior extensive endarterectomy of the right external iliac, common femoral bypass, prior R CEA. R iliac disease noted during 03/2013 cath.  . Ischemic leg 28-Feb-2008    with extensive endarterectomy  . Chronic systolic CHF (congestive heart failure)     a. EF 35% by cath 02-28-12. b. EF 25-30% by echo 03/2013.  Marland Kitchen Anemia     Instructed Feb 28, 2012 to f/u with PCP  . Nocturnal oxygen desaturation Feb 28, 2012  . COPD (chronic obstructive pulmonary disease)     a. Suspected during admit 03/2013.  Marland Kitchen Atrial fibrillation     a. Transient during 03/2013 admission, started on apixaban/amiodarone.  . Complication of anesthesia     Lungs filled up with fluid when he had "gas anesthesia."  . Anginal pain   . Ischemic cardiomyopathy   . CKD (chronic kidney disease), stage II   . PAD (peripheral artery disease)   . Pneumonia     "haven't had it since I was in my late 02/28/2023" (June 22, 2013)  . Rheumatic fever ~ 1947  . Exertional shortness of breath   . Peptic ulcer 1960's    "tx'd w/RX; gone in ~ 3 months; didn't come back" (06-22-13)  . Migraines     "stopped after sudden death episode in 02-28-1988" (2013/06/22)  . Stroke 02/28/08    "had temporary blindness", denies residual on 2013-06-22  . Arthritis     "spine, wrist" (22-Jun-2013)  . Anxiety     "hx" (06/22/2013)  . Depression     "hx" (Jun 22, 2013)  . Basal cell carcinoma of face 2010/02/27  . Coronary artery disease     a. anterior MI in Feb 28, 1988. b. MI with CABG in Feb 27, 1997. c. Small NSTEMI s/p DES to SVG-OM 28-Feb-2012, newly recognized LV dysfunction at that time. d.  NSTEMI 03/2013: occlusion of OM branch after touchdown of SVG, likely the culprit, too small for PCI, for med rx.  . Myocardial infarction May 23, 198905/23/2013; 03/2013    "might have been some small ones inbetween; those were the most serious" (06/22/2013)  . Anterior myocardial infarction 1988-02-28  . NSTEMI (non-ST elevated myocardial infarction) 28-Feb-2012  . STEMI (ST elevation myocardial infarction) 03/2013   Discharged Condition: good  HPI: [name] is a 67 y.o. male had a  right femoral-popliteal bypass graft with Gore-Tex in 2008-02-28 as well as a right carotid endarterectomy in the past.  Pt had an extensive left femoral and iliac endarterectomy a few weeks ago with insertion of a Dacron graft from the external iliac to the distal profunda femoris artery. He has been noticing malodorous drainage from the left inguinal area over the past few days. He came to the office because of the left groin problem. He states that the left foot pain and he was experiencing preoperatively is now resolved and he is able to sleep at night Left inguinal wound infection with extensive fat necrosis-sharply debrided and wound packed and he was admitted to hospital 06-22-13 for IV antibiotics-vancomycin and Zosyn and frequent wound packing and daily debridement   Hospital Course:  [name] is a 67 y.o. male  was begun on broad spectrum IV antibiotics. The wound culture showed multiple organisms and pt antibiotics changed to Cipro per pharmacy reccommendations Pt had TID dressing changes and a wound vac was placed Physical exam: left groin wound clean and granulating Post-op wounds healing well Pt. Ambulating, voiding and taking PO diet without difficulty. Pt pain controlled with PO pain meds. Labs as below Complications:none  Consults:     Significant Diagnostic Studies: CBC Lab Results  Component Value Date   WBC 9.3 06/18/2013   HGB 9.5* 06/18/2013   HCT 27.8* 06/18/2013   MCV 91.1 06/18/2013   PLT 216 06/18/2013     BMET    Component Value Date/Time   NA 132* 06/16/2013 0452   K 4.0 06/16/2013 0452   CL 95* 06/16/2013 0452   CO2 27 06/16/2013 0452   GLUCOSE 99 06/16/2013 0452   BUN 20 06/16/2013 0452   CREATININE 1.16 06/16/2013 0452   CALCIUM 9.4 06/16/2013 0452   GFRNONAA 62* 06/16/2013 0452   GFRAA 72* 06/16/2013 0452   COAG Lab Results  Component Value Date   INR 1.25 06/09/2013   INR 1.15 05/12/2013   INR 0.99 03/21/2013     Disposition:  Discharge to :Home Discharge Orders   Future Appointments Provider Department Dept Phone   06/30/2013 4:30 PM Vesta Mixer, MD [facility] Main Office [place]) [phone]   07/14/2013 9:15 AM Pryor Ochoa, MD [name] [phone]   Future Orders Complete By Expires   Call MD for:  redness, tenderness, or signs of infection (pain, swelling, bleeding, redness, odor or green/yellow discharge around incision site)  As directed    Call MD for:  severe or increased pain, loss or decreased feeling  in affected limb(s)  As directed    Call MD for:  temperature >100.5  As directed    Discharge wound care:  As directed    Comments:     Wound vac change every M,W,F by RN   Driving Restrictions  As directed    Comments:     No driving   Increase activity slowly  As directed    Comments:     Walk with assistance use walker or cane as needed   Resume previous diet  As directed    Walk with assistance  As directed        Medication List         albuterol 108 (90 BASE) MCG/ACT inhaler  Commonly known as:  PROVENTIL HFA;VENTOLIN HFA  Inhale 2 puffs into the lungs every 6 (six) hours as needed for wheezing or shortness of breath.     amiodarone 200 MG tablet  Commonly known as:  PACERONE  Take 1 tablet (200 mg total) by mouth daily.     amLODipine 5 MG tablet  Commonly known as:  NORVASC  Take 1 tablet (5 mg total) by mouth daily.     apixaban 5 MG Tabs tablet  Commonly known as:  ELIQUIS  Take 1 tablet (5 mg total)  by mouth 2 (two) times daily.     aspirin 81 MG tablet  Take 1 tablet (81 mg total) by mouth daily.     atorvastatin 80 MG tablet  Commonly known as:  LIPITOR  Take 1 tablet (80 mg total) by mouth every evening.     carvedilol 6.25 MG tablet  Commonly known as:  COREG  Take 1 tablet (6.25 mg total) by mouth 2 (two) times daily with a meal.  ciprofloxacin 500 MG tablet  Commonly known as:  CIPRO  Take 1 tablet (500 mg total) by mouth 2 (two) times daily.     clobetasol 0.05 % topical foam  Commonly known as:  OLUX  Apply topically 2 (two) times daily.     enalapril 5 MG tablet  Commonly known as:  VASOTEC  Take 4 tablets (20 mg total) by mouth 2 (two) times daily.     furosemide 40 MG tablet  Commonly known as:  LASIX  Take 1 tablet (40 mg total) by mouth daily.     isosorbide mononitrate 30 MG 24 hr tablet  Commonly known as:  IMDUR  TAKE 1 TABLET (30 MG TOTAL) BY MOUTH DAILY.     loratadine 10 MG tablet  Commonly known as:  CLARITIN  Take 10 mg by mouth every evening.     metoCLOPramide 5 MG tablet  Commonly known as:  REGLAN  Take 1 tablet (5 mg total) by mouth 4 (four) times daily.     nitroGLYCERIN 0.4 MG SL tablet  Commonly known as:  NITROSTAT  Place 1 tablet (0.4 mg total) under the tongue every 5 (five) minutes as needed for chest pain.     oxyCODONE 5 MG immediate release tablet  Commonly known as:  Oxy IR/ROXICODONE  Take 1-2 tablets (5-10 mg total) by mouth every 6 (six) hours as needed for pain.     tamsulosin 0.4 MG Caps capsule  Commonly known as:  FLOMAX  Take 1 capsule (0.4 mg total) by mouth daily.       Verbal and written Discharge instructions given to the patient. Wound care per Discharge AVS     Follow-up Information   Follow up with Elyn Aquas., MD On 06/30/2013. (at 4:30 pm)    Specialty:  Cardiology   Contact information:   [address] [place] [address] [phone]       Follow up with Josephina Gip, MD In 2 weeks. (office will arrange-sent)    Specialty:  Vascular Surgery   Contact information:   [address] [place] [address] [phone]       Signed: Marlowe Shores 06/19/2013, 10:12 AM

## 2013-06-19 NOTE — Consult Note (Signed)
WOC Follow-up consult Note Dr Cyril Mourning in earlier to assess left groin wound and remove vac dressing.  Unchanged from previous assessment; refer to progress notes for description and measurements. Applied one piece Mepitel contact layer and one piece black sponge to cont suction.  Pt tolerated with minimal discomfort.  Awaiting delivery of home vac prior to discharge home. Small amt pink drainage in cannister. Cammie Mcgee MSN, RN, CWOCN, [place], CNS [phone]

## 2013-06-19 NOTE — Progress Notes (Signed)
Discharged to home with family office visits in place teaching done  

## 2013-06-22 ENCOUNTER — Encounter (HOSPITAL_COMMUNITY): Payer: Self-pay | Admitting: *Deleted

## 2013-06-22 ENCOUNTER — Emergency Department (HOSPITAL_COMMUNITY): Payer: Medicare Other

## 2013-06-22 ENCOUNTER — Inpatient Hospital Stay (HOSPITAL_COMMUNITY)
Admission: EM | Admit: 2013-06-22 | Discharge: 2013-06-25 | DRG: 391 | Disposition: A | Discharge status: Home / Self Care | Payer: Medicare Other | Attending: Internal Medicine | Admitting: Internal Medicine

## 2013-06-22 DIAGNOSIS — E860 Dehydration: Secondary | ICD-10-CM

## 2013-06-22 DIAGNOSIS — E785 Hyperlipidemia, unspecified: Secondary | ICD-10-CM

## 2013-06-22 DIAGNOSIS — I4891 Unspecified atrial fibrillation: Secondary | ICD-10-CM

## 2013-06-22 DIAGNOSIS — Z7982 Long term (current) use of aspirin: Secondary | ICD-10-CM

## 2013-06-22 DIAGNOSIS — Z85820 Personal history of malignant melanoma of skin: Secondary | ICD-10-CM

## 2013-06-22 DIAGNOSIS — F32A Depression, unspecified: Secondary | ICD-10-CM

## 2013-06-22 DIAGNOSIS — I255 Ischemic cardiomyopathy: Secondary | ICD-10-CM

## 2013-06-22 DIAGNOSIS — K2960 Other gastritis without bleeding: Secondary | ICD-10-CM

## 2013-06-22 DIAGNOSIS — I2089 Other forms of angina pectoris: Secondary | ICD-10-CM

## 2013-06-22 DIAGNOSIS — Z8673 Personal history of transient ischemic attack (TIA), and cerebral infarction without residual deficits: Secondary | ICD-10-CM

## 2013-06-22 DIAGNOSIS — J4489 Other specified chronic obstructive pulmonary disease: Secondary | ICD-10-CM | POA: Diagnosis present

## 2013-06-22 DIAGNOSIS — I129 Hypertensive chronic kidney disease with stage 1 through stage 4 chronic kidney disease, or unspecified chronic kidney disease: Secondary | ICD-10-CM | POA: Diagnosis present

## 2013-06-22 DIAGNOSIS — T8140XA Infection following a procedure, unspecified, initial encounter: Secondary | ICD-10-CM | POA: Diagnosis present

## 2013-06-22 DIAGNOSIS — R1115 Cyclical vomiting syndrome unrelated to migraine: Secondary | ICD-10-CM | POA: Diagnosis present

## 2013-06-22 DIAGNOSIS — I739 Peripheral vascular disease, unspecified: Secondary | ICD-10-CM

## 2013-06-22 DIAGNOSIS — Z79899 Other long term (current) drug therapy: Secondary | ICD-10-CM

## 2013-06-22 DIAGNOSIS — T8149XA Infection following a procedure, other surgical site, initial encounter: Secondary | ICD-10-CM

## 2013-06-22 DIAGNOSIS — I252 Old myocardial infarction: Secondary | ICD-10-CM

## 2013-06-22 DIAGNOSIS — I208 Other forms of angina pectoris: Secondary | ICD-10-CM

## 2013-06-22 DIAGNOSIS — J449 Chronic obstructive pulmonary disease, unspecified: Secondary | ICD-10-CM | POA: Diagnosis present

## 2013-06-22 DIAGNOSIS — IMO0001 Reserved for inherently not codable concepts without codable children: Secondary | ICD-10-CM

## 2013-06-22 DIAGNOSIS — R55 Syncope and collapse: Secondary | ICD-10-CM

## 2013-06-22 DIAGNOSIS — K219 Gastro-esophageal reflux disease without esophagitis: Secondary | ICD-10-CM | POA: Diagnosis present

## 2013-06-22 DIAGNOSIS — I429 Cardiomyopathy, unspecified: Secondary | ICD-10-CM

## 2013-06-22 DIAGNOSIS — I10 Essential (primary) hypertension: Secondary | ICD-10-CM

## 2013-06-22 DIAGNOSIS — R112 Nausea with vomiting, unspecified: Secondary | ICD-10-CM

## 2013-06-22 DIAGNOSIS — Z720 Tobacco use: Secondary | ICD-10-CM

## 2013-06-22 DIAGNOSIS — Y832 Surgical operation with anastomosis, bypass or graft as the cause of abnormal reaction of the patient, or of later complication, without mention of misadventure at the time of the procedure: Secondary | ICD-10-CM | POA: Diagnosis present

## 2013-06-22 DIAGNOSIS — K5900 Constipation, unspecified: Secondary | ICD-10-CM

## 2013-06-22 DIAGNOSIS — E7800 Pure hypercholesterolemia, unspecified: Secondary | ICD-10-CM | POA: Diagnosis present

## 2013-06-22 DIAGNOSIS — I5023 Acute on chronic systolic (congestive) heart failure: Secondary | ICD-10-CM

## 2013-06-22 DIAGNOSIS — N400 Enlarged prostate without lower urinary tract symptoms: Secondary | ICD-10-CM | POA: Diagnosis present

## 2013-06-22 DIAGNOSIS — I2510 Atherosclerotic heart disease of native coronary artery without angina pectoris: Secondary | ICD-10-CM

## 2013-06-22 DIAGNOSIS — K2900 Acute gastritis without bleeding: Secondary | ICD-10-CM

## 2013-06-22 DIAGNOSIS — E119 Type 2 diabetes mellitus without complications: Secondary | ICD-10-CM

## 2013-06-22 DIAGNOSIS — I214 Non-ST elevation (NSTEMI) myocardial infarction: Secondary | ICD-10-CM | POA: Diagnosis present

## 2013-06-22 DIAGNOSIS — M129 Arthropathy, unspecified: Secondary | ICD-10-CM | POA: Diagnosis present

## 2013-06-22 DIAGNOSIS — I5022 Chronic systolic (congestive) heart failure: Secondary | ICD-10-CM

## 2013-06-22 DIAGNOSIS — E43 Unspecified severe protein-calorie malnutrition: Secondary | ICD-10-CM | POA: Diagnosis present

## 2013-06-22 DIAGNOSIS — F329 Major depressive disorder, single episode, unspecified: Secondary | ICD-10-CM

## 2013-06-22 DIAGNOSIS — Z951 Presence of aortocoronary bypass graft: Secondary | ICD-10-CM

## 2013-06-22 DIAGNOSIS — E871 Hypo-osmolality and hyponatremia: Secondary | ICD-10-CM

## 2013-06-22 DIAGNOSIS — N182 Chronic kidney disease, stage 2 (mild): Secondary | ICD-10-CM

## 2013-06-22 DIAGNOSIS — I2589 Other forms of chronic ischemic heart disease: Secondary | ICD-10-CM | POA: Diagnosis present

## 2013-06-22 DIAGNOSIS — E440 Moderate protein-calorie malnutrition: Secondary | ICD-10-CM

## 2013-06-22 DIAGNOSIS — N179 Acute kidney failure, unspecified: Secondary | ICD-10-CM

## 2013-06-22 DIAGNOSIS — Z9229 Personal history of other drug therapy: Secondary | ICD-10-CM

## 2013-06-22 DIAGNOSIS — R0602 Shortness of breath: Secondary | ICD-10-CM

## 2013-06-22 LAB — URINALYSIS, ROUTINE W REFLEX MICROSCOPIC
Bilirubin Urine: NEGATIVE
Hgb urine dipstick: NEGATIVE
Ketones, ur: NEGATIVE mg/dL
Specific Gravity, Urine: 1.012 (ref 1.005–1.030)
Urobilinogen, UA: 0.2 mg/dL (ref 0.0–1.0)
pH: 5.5 (ref 5.0–8.0)

## 2013-06-22 LAB — CBC WITH DIFFERENTIAL/PLATELET
Eosinophils Relative: 6 % — ABNORMAL HIGH (ref 0–5)
HCT: 29.6 % — ABNORMAL LOW (ref 39.0–52.0)
Hemoglobin: 10.1 g/dL — ABNORMAL LOW (ref 13.0–17.0)
Lymphocytes Relative: 12 % (ref 12–46)
Lymphs Abs: 1 10*3/uL (ref 0.7–4.0)
MCH: 30.8 pg (ref 26.0–34.0)
MCV: 90.2 fL (ref 78.0–100.0)
Monocytes Absolute: 0.7 10*3/uL (ref 0.1–1.0)
Monocytes Relative: 7 % (ref 3–12)
RBC: 3.28 MIL/uL — ABNORMAL LOW (ref 4.22–5.81)
WBC: 8.8 10*3/uL (ref 4.0–10.5)

## 2013-06-22 LAB — COMPREHENSIVE METABOLIC PANEL
ALT: 20 U/L (ref 0–53)
BUN: 34 mg/dL — ABNORMAL HIGH (ref 6–23)
CO2: 25 mEq/L (ref 19–32)
Calcium: 9.7 mg/dL (ref 8.4–10.5)
GFR calc Af Amer: 49 mL/min — ABNORMAL LOW (ref >90)
GFR calc non Af Amer: 42 mL/min — ABNORMAL LOW (ref >90)
Glucose, Bld: 104 mg/dL — ABNORMAL HIGH (ref 70–99)
Sodium: 130 mEq/L — ABNORMAL LOW (ref 135–145)

## 2013-06-22 LAB — URINE MICROSCOPIC-ADD ON

## 2013-06-22 LAB — POCT I-STAT TROPONIN I
Troponin i, poc: 0.02 ng/mL (ref 0.00–0.08)
Troponin i, poc: 0 ng/mL (ref 0.00–0.08)

## 2013-06-22 LAB — PROTIME-INR: INR: 1.33 (ref 0.00–1.49)

## 2013-06-22 LAB — LACTIC ACID, PLASMA: Lactic Acid, Venous: 1.1 mmol/L (ref 0.5–2.2)

## 2013-06-22 MED ORDER — PROMETHAZINE HCL 25 MG/ML IJ SOLN
12.5000 mg | Freq: Four times a day (QID) | INTRAMUSCULAR | Status: AC | PRN
  Filled 2013-06-22: qty 1

## 2013-06-22 MED ORDER — ALBUTEROL SULFATE HFA 108 (90 BASE) MCG/ACT IN AERS
2.0000 | INHALATION_SPRAY | Freq: Four times a day (QID) | RESPIRATORY_TRACT | Status: DC | PRN
Start: 2013-06-22 — End: 2013-06-25
  Filled 2013-06-22: qty 6.7

## 2013-06-22 MED ORDER — CIPROFLOXACIN HCL 500 MG PO TABS
500.0000 mg | ORAL_TABLET | Freq: Two times a day (BID) | ORAL | Status: DC
  Administered 2013-06-22 – 2013-06-25 (×7): 500 mg via ORAL
  Filled 2013-06-22 (×9): qty 1

## 2013-06-22 MED ORDER — AMLODIPINE BESYLATE 5 MG PO TABS
5.0000 mg | ORAL_TABLET | Freq: Every day | ORAL | Status: DC
  Administered 2013-06-22 – 2013-06-25 (×4): 5 mg via ORAL
  Filled 2013-06-22 (×4): qty 1

## 2013-06-22 MED ORDER — OXYCODONE HCL 5 MG PO TABS: 5.0000 mg | ORAL_TABLET | Freq: Four times a day (QID) | ORAL | Status: DC | PRN

## 2013-06-22 MED ORDER — CARVEDILOL 6.25 MG PO TABS
6.2500 mg | ORAL_TABLET | Freq: Two times a day (BID) | ORAL | Status: DC
  Administered 2013-06-22 – 2013-06-25 (×6): 6.25 mg via ORAL
  Filled 2013-06-22 (×8): qty 1

## 2013-06-22 MED ORDER — ISOSORBIDE MONONITRATE ER 30 MG PO TB24
30.0000 mg | ORAL_TABLET | Freq: Every day | ORAL | Status: DC
  Administered 2013-06-22 – 2013-06-25 (×4): 30 mg via ORAL
  Filled 2013-06-22 (×5): qty 1

## 2013-06-22 MED ORDER — AMIODARONE HCL 200 MG PO TABS
200.0000 mg | ORAL_TABLET | Freq: Every day | ORAL | Status: DC
  Administered 2013-06-22 – 2013-06-25 (×4): 200 mg via ORAL
  Filled 2013-06-22 (×4): qty 1

## 2013-06-22 MED ORDER — ATORVASTATIN CALCIUM 80 MG PO TABS
80.0000 mg | ORAL_TABLET | Freq: Every evening | ORAL | Status: DC
  Administered 2013-06-22 – 2013-06-24 (×3): 80 mg via ORAL
  Filled 2013-06-22 (×4): qty 1

## 2013-06-22 MED ORDER — METOCLOPRAMIDE HCL 5 MG PO TABS
5.0000 mg | ORAL_TABLET | Freq: Four times a day (QID) | ORAL | Status: AC
  Administered 2013-06-22 – 2013-06-23 (×7): 5 mg via ORAL
  Filled 2013-06-22 (×9): qty 1

## 2013-06-22 MED ORDER — TAMSULOSIN HCL 0.4 MG PO CAPS
0.4000 mg | ORAL_CAPSULE | Freq: Every day | ORAL | Status: DC
  Administered 2013-06-22 – 2013-06-25 (×4): 0.4 mg via ORAL
  Filled 2013-06-22 (×5): qty 1

## 2013-06-22 MED ORDER — HYDROCODONE-ACETAMINOPHEN 10-325 MG PO TABS
1.0000 | ORAL_TABLET | Freq: Four times a day (QID) | ORAL | Status: DC | PRN
  Administered 2013-06-22 – 2013-06-23 (×2): 1 via ORAL
  Filled 2013-06-22 (×2): qty 1

## 2013-06-22 MED ORDER — NITROGLYCERIN 0.4 MG SL SUBL: 0.4000 mg | SUBLINGUAL_TABLET | SUBLINGUAL | Status: DC | PRN

## 2013-06-22 MED ORDER — APIXABAN 5 MG PO TABS
5.0000 mg | ORAL_TABLET | Freq: Two times a day (BID) | ORAL | Status: DC
  Administered 2013-06-22: 5 mg via ORAL
  Filled 2013-06-22 (×3): qty 1

## 2013-06-22 MED ORDER — ONDANSETRON HCL 4 MG/2ML IJ SOLN
4.0000 mg | Freq: Four times a day (QID) | INTRAMUSCULAR | Status: AC | PRN
  Administered 2013-06-22 (×2): 4 mg via INTRAVENOUS
  Filled 2013-06-22 (×2): qty 2

## 2013-06-22 MED ORDER — ASPIRIN 81 MG PO CHEW
81.0000 mg | CHEWABLE_TABLET | Freq: Every day | ORAL | Status: DC
  Administered 2013-06-22 – 2013-06-25 (×4): 81 mg via ORAL
  Filled 2013-06-22 (×6): qty 1

## 2013-06-22 MED ORDER — ENALAPRIL MALEATE 5 MG PO TABS
5.0000 mg | ORAL_TABLET | Freq: Two times a day (BID) | ORAL | Status: DC
  Administered 2013-06-22 – 2013-06-25 (×6): 5 mg via ORAL
  Filled 2013-06-22 (×9): qty 1

## 2013-06-22 MED ORDER — ACETAMINOPHEN 325 MG PO TABS
650.0000 mg | ORAL_TABLET | ORAL | Status: DC | PRN
  Administered 2013-06-23: 650 mg via ORAL
  Filled 2013-06-22: qty 2

## 2013-06-22 MED ORDER — HEPARIN SODIUM (PORCINE) 5000 UNIT/ML IJ SOLN: 5000.0000 [IU] | Freq: Three times a day (TID) | INTRAMUSCULAR | Status: DC

## 2013-06-22 MED ORDER — PANTOPRAZOLE SODIUM 40 MG PO TBEC
40.0000 mg | DELAYED_RELEASE_TABLET | Freq: Two times a day (BID) | ORAL | Status: DC
  Administered 2013-06-22 – 2013-06-25 (×6): 40 mg via ORAL
  Filled 2013-06-22 (×6): qty 1

## 2013-06-22 NOTE — Progress Notes (Signed)
Patient place on monitor.  Verified with monitor tech.

## 2013-06-22 NOTE — Progress Notes (Addendum)
[facility] PROGRESS NOTE  [name] JXB:[id number] DOB: [birth year] DOA: 06/22/2013 PCP: Nicki Reaper, NP  Assessment/Plan  DOE with nausea/vomiting, possibly anginal equivalent.   -  Troponins neg so far -  F/u telemetry -  A1c, lipids  Nausea and vomiting.  Differential diagnosis includes medication side effect, GERD, peptic ulcer disease, gastroparesis.  Agree that he has risk factors for mesenteric ischemia, and if further workup is unremarkable, would recommend CT angio to rule this out.  Would need peri-CT fluids given his CKD.  Also consider central causes of persistent nausea - at risk for CVA also.   -  EGD in a.m. -  Continue Zofran, Phenergan, Reglan -  Start Protonix -  Appreciate GI recommendations -  Consider CTA abdomen and/or MRI brain pending EGD eval  Paroxysmal atrial fibrillation. Rhythm controlled -  Continue amiodarone -  Hold Apixaban prior to procedure  Coronary artery disease. Continue aspirin, beta blocker and statin -  Continue Imdur -  Continue nitroglycerin when necessary  Ischemic cardiomyopathy with ejection fraction of 35%. - Continue beta blocker, ACE inhibitor -  Hold Lasix again tomorrow prior to procedure -  Consider addition of Spironolactone  Severe peripheral vascular disease -  Continue aspirin  Left groin graft site infection -  Continue ciprofloxacin -  Appreciate wound care assistance with wound VAC  COPD, stable. Continue albuterol when necessary  BPH, stable. Continue Flomax  Diet:  Healthy heart Access:  PIV IVF:  Off Proph:  SCD  Code Status: Full Family Communication: Patient alone Disposition Plan: Pending able to tolerate diet   Consultants:  Gastroenterology  Procedures:  None  Antibiotics:  Ciprofloxacin   HPI/Subjective:  Patient states that he continues to have some dyspnea. He has persistent nausea with nonbilious nonbloody vomiting. He denies constipation or diarrhea. He denies blood  in his stools. He has had considerable weight loss since his STEMI in 03/2013   Objective: Filed Vitals:   06/22/13 0630 06/22/13 0719 06/22/13 1352 06/22/13 1440  BP: 129/58 134/56 101/42 119/40  Pulse: 57 57 70 60  Temp:  97.7 F (36.5 C) 97.8 F (36.6 C) 98.1 F (36.7 C)  TempSrc:  Oral Oral Oral  Resp: 24 22 20 18   Height:  5' 8.5" (1.74 m)    Weight:  83.2 kg (183 lb 6.8 oz)    SpO2: 94% 99% 94% 93%    Intake/Output Summary (Last 24 hours) at 06/22/13 1806 Last data filed at 06/22/13 1353  Gross per 24 hour  Intake    120 ml  Output    700 ml  Net   -580 ml   Filed Weights   06/22/13 0719  Weight: 83.2 kg (183 lb 6.8 oz)    Exam:   General:  Caucasian male, No acute distress  HEENT:  NCAT, MMM, perioral pallor  Cardiovascular:  Distant heart sounds, regular rate and rhythm, nl S1, S2, 4/6 systolic murmur at the left sternal border and apex with heave, warm extremities  Respiratory:  CTAB, no increased WOB  Abdomen:   NABS, soft, NT/ND  MSK:   Normal tone and bulk, trace LEE,  2+ right pedal pulse, 1+ left pedal pulse. Left groin with wound VAC in place, good seal, nonerythematous.  Neuro:  Grossly intact  Data Reviewed: Basic Metabolic Panel:  Recent Labs Lab 06/16/13 0452 06/22/13 0235  NA 132* 130*  K 4.0 3.9  CL 95* 90*  CO2 27 25  GLUCOSE 99 104*  BUN 20 34*  CREATININE 1.16 1.60*  CALCIUM 9.4 9.7  MG  --  2.3   Liver Function Tests:  Recent Labs Lab 06/22/13 0235  AST 20  ALT 20  ALKPHOS 73  BILITOT 0.4  PROT 7.8  ALBUMIN 3.7    Recent Labs Lab 06/22/13 0235  LIPASE 24   No results found for this basename: AMMONIA,  in the last 168 hours CBC:  Recent Labs Lab 06/18/13 0545 06/22/13 0235  WBC 9.3 8.8  NEUTROABS  --  6.6  HGB 9.5* 10.1*  HCT 27.8* 29.6*  MCV 91.1 90.2  PLT 216 289   Cardiac Enzymes:  Recent Labs Lab 06/22/13 0653 06/22/13 1428  TROPONINI <0.30 <0.30   BNP (last 3 results)  Recent Labs   03/21/13 1916 05/28/13 0103 06/22/13 0235  PROBNP 2099.0* 7588.0* 3043.0*   CBG:  Recent Labs Lab 06/22/13 0126  GLUCAP 110*    No results found for this or any previous visit (from the past 240 hour(s)).   Studies: Dg Chest 2 View  06/22/2013   CLINICAL DATA:  Nausea, emesis, shortness of breath.  EXAM: CHEST  2 VIEW  COMPARISON:  06/01/2013.  FINDINGS: No cardiomegaly. Aortic atherosclerosis, status post CABG. Postsurgical changes to the left chest wall. No edema, effusion, infiltrate, or pneumothorax. Calcified granuloma in the left mid lung. Osteopenia.  IMPRESSION: 1. No evidence of acute cardiopulmonary disease. 2. Status post CABG.   Electronically Signed   By: Tiburcio Pea   On: 06/22/2013 03:41    Scheduled Meds: . amiodarone  200 mg Oral Daily  . amLODipine  5 mg Oral Daily  . aspirin  81 mg Oral Daily  . atorvastatin  80 mg Oral QPM  . carvedilol  6.25 mg Oral BID WC  . ciprofloxacin  500 mg Oral BID  . enalapril  5 mg Oral BID  . isosorbide mononitrate  30 mg Oral Daily  . metoCLOPramide  5 mg Oral QID  . pantoprazole  40 mg Oral BID AC  . tamsulosin  0.4 mg Oral Daily   Continuous Infusions:   Principal Problem:   Equivalent angina Active Problems:   CAD (coronary artery disease)   HTN (hypertension)   A-fib   PVD (peripheral vascular disease)   CKD (chronic kidney disease), stage II   Surgical wound infection   Intractable nausea and vomiting    Time spent: 30 min    Tuwanna Krausz  [facility] Pager [phone]. If 7PM-7AM, please contact night-coverage at [email or web address], password [facility] 06/22/2013, 6:06 PM  LOS: 0 days
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
## 2013-06-22 NOTE — Progress Notes (Signed)
[facility]  Patient Status: Active (receiving services up to time of hospitalization)  [facility] is providing the following services: RN and PT  If patient discharges after hours, please call [phone].   Wynelle Bourgeois 06/22/2013, 5:49 PM

## 2013-06-22 NOTE — ED Notes (Signed)
Pt reports the reason why he called 911 was because of shortness of breath and cold sweats. Pt denies nausea at this time.

## 2013-06-22 NOTE — ED Notes (Signed)
Per EMS: pt coming from home with c/o n/v for past three days. Pt is A&Ox4, respirations equal and unlabored, skin warm and dry.

## 2013-06-22 NOTE — Consult Note (Signed)
WOC consult Note Reason for Consult: Consult requested for vac dressing change to left groin.  Pt familiar to WOC from previous admission; refer to progress notes from Fri. Wound type: Full thickness Measurement:3X4X1.2cm Wound bed: 10% yellow, 90% red Drainage (amount, consistency, odor) small amt pink drainage, no odor Periwound: intact skin surrounding Dressing procedure/placement/frequency: Mepitel contact layer applied with 1 piece black foam to cont suction.  Pt denied c/o pain.  Freedom vac at bedside; bedisde nurse states she will apply hospital machine when it arrives. Please re-consult if further assistance is needed.  Thank-you,  Cammie Mcgee MSN, RN, CWOCN, [place], CNS [phone]

## 2013-06-22 NOTE — Care Management Note (Signed)
    Page 1 of 1   06/22/2013     4:12:28 PM   CARE MANAGEMENT NOTE 06/22/2013  Patient:  FINNEAN, CERAMI   Account Number:  [id number]  Date Initiated:  06/22/2013  Documentation initiated by:  [facility]  Subjective/Objective Assessment:   67 y.o. male who was recently discharged after having cholecystectomy for acute cholecystitis presents with complaints of weakness. //home with spouse     Action/Plan:   [facility]//resumption of care   Anticipated DC Date:  06/25/2013   Anticipated DC Plan:  HOME W HOME HEALTH SERVICES      DC Planning Services  CM consult      [facility] Choice  HOME HEALTH   Choice offered to / List presented to:          [facility] arranged  HH-1 RN      [facility] agency  [facility]   Status of service:  Completed, signed off Medicare Important Message given?   (If response is "NO", the following Medicare IM given date fields will be blank) Date Medicare IM given:   Date Additional Medicare IM given:    Discharge Disposition:    Per UR Regulation:    If discussed at Long Length of Stay Meetings, dates discussed:    Comments:  06/22/13 1600 Camellia Wood, RN,BSN, NCM [phone] Spoke with pt and wife at bedside regarding discharge planning.  Pt currently receives services through [facility] for management of wound vac.  NCM notifed Starling Manns of [facility] to resume services on pt d/c home.
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
## 2013-06-23 ENCOUNTER — Encounter (HOSPITAL_COMMUNITY)
Admission: EM | Disposition: A | Discharge status: Home / Self Care | Payer: Medicare Other | Source: Home / Self Care | Attending: Internal Medicine

## 2013-06-23 ENCOUNTER — Encounter (HOSPITAL_COMMUNITY): Payer: Self-pay | Admitting: *Deleted

## 2013-06-23 DIAGNOSIS — K5900 Constipation, unspecified: Secondary | ICD-10-CM | POA: Diagnosis present

## 2013-06-23 DIAGNOSIS — I2510 Atherosclerotic heart disease of native coronary artery without angina pectoris: Secondary | ICD-10-CM

## 2013-06-23 DIAGNOSIS — I428 Other cardiomyopathies: Secondary | ICD-10-CM

## 2013-06-23 DIAGNOSIS — K2960 Other gastritis without bleeding: Secondary | ICD-10-CM | POA: Diagnosis present

## 2013-06-23 DIAGNOSIS — E860 Dehydration: Secondary | ICD-10-CM

## 2013-06-23 DIAGNOSIS — N179 Acute kidney failure, unspecified: Secondary | ICD-10-CM

## 2013-06-23 DIAGNOSIS — K2900 Acute gastritis without bleeding: Secondary | ICD-10-CM | POA: Insufficient documentation

## 2013-06-23 DIAGNOSIS — R1115 Cyclical vomiting syndrome unrelated to migraine: Secondary | ICD-10-CM

## 2013-06-23 HISTORY — PX: ESOPHAGOGASTRODUODENOSCOPY: SHX5428

## 2013-06-23 SURGERY — EGD (ESOPHAGOGASTRODUODENOSCOPY)
Anesthesia: Moderate Sedation

## 2013-06-23 MED ORDER — FENTANYL CITRATE 0.05 MG/ML IJ SOLN
INTRAMUSCULAR | Status: DC | PRN
  Administered 2013-06-23 (×2): 25 ug via INTRAVENOUS

## 2013-06-23 MED ORDER — FUROSEMIDE 40 MG PO TABS
40.0000 mg | ORAL_TABLET | Freq: Every day | ORAL | Status: DC
  Administered 2013-06-23 – 2013-06-25 (×2): 40 mg via ORAL
  Filled 2013-06-23 (×3): qty 1

## 2013-06-23 MED ORDER — FENTANYL CITRATE 0.05 MG/ML IJ SOLN
INTRAMUSCULAR | Status: AC
  Filled 2013-06-23: qty 2

## 2013-06-23 MED ORDER — POLYETHYLENE GLYCOL 3350 17 G PO PACK
17.0000 g | PACK | Freq: Every day | ORAL | Status: DC | PRN
  Filled 2013-06-23: qty 1

## 2013-06-23 MED ORDER — DOCUSATE SODIUM 100 MG PO CAPS
100.0000 mg | ORAL_CAPSULE | Freq: Two times a day (BID) | ORAL | Status: DC
  Administered 2013-06-23 – 2013-06-25 (×4): 100 mg via ORAL
  Filled 2013-06-23 (×5): qty 1

## 2013-06-23 MED ORDER — BUTAMBEN-TETRACAINE-BENZOCAINE 2-2-14 % EX AERO
INHALATION_SPRAY | CUTANEOUS | Status: DC | PRN
  Administered 2013-06-23: 10:00:00 22 via TOPICAL

## 2013-06-23 MED ORDER — ALUM & MAG HYDROXIDE-SIMETH 200-200-20 MG/5ML PO SUSP: 15.0000 mL | ORAL | Status: DC | PRN

## 2013-06-23 MED ORDER — SODIUM CHLORIDE 0.9 % IV SOLN
INTRAVENOUS | Status: DC
  Administered 2013-06-23 (×2): via INTRAVENOUS

## 2013-06-23 MED ORDER — APIXABAN 5 MG PO TABS
5.0000 mg | ORAL_TABLET | Freq: Two times a day (BID) | ORAL | Status: DC
  Administered 2013-06-23 – 2013-06-25 (×5): 5 mg via ORAL
  Filled 2013-06-23 (×6): qty 1

## 2013-06-23 MED ORDER — SENNA 8.6 MG PO TABS
2.0000 | ORAL_TABLET | Freq: Every day | ORAL | Status: DC
  Administered 2013-06-23 – 2013-06-24 (×2): 17.2 mg via ORAL
  Filled 2013-06-23 (×3): qty 2

## 2013-06-23 MED ORDER — MIDAZOLAM HCL 10 MG/2ML IJ SOLN
INTRAMUSCULAR | Status: DC | PRN
  Administered 2013-06-23: 10:00:00 2 mg via INTRAVENOUS
  Administered 2013-06-23: 1 mg via INTRAVENOUS
  Administered 2013-06-23: 10:00:00 2 mg via INTRAVENOUS

## 2013-06-23 MED ORDER — ENSURE COMPLETE PO LIQD: 237.0000 mL | Freq: Two times a day (BID) | ORAL | Status: DC

## 2013-06-23 MED ORDER — ADULT MULTIVITAMIN W/MINERALS CH
1.0000 | ORAL_TABLET | Freq: Every day | ORAL | Status: DC
  Administered 2013-06-23 – 2013-06-25 (×3): 1 via ORAL
  Filled 2013-06-23 (×3): qty 1

## 2013-06-23 MED ORDER — TRAMADOL HCL 50 MG PO TABS
100.0000 mg | ORAL_TABLET | Freq: Four times a day (QID) | ORAL | Status: DC | PRN
  Administered 2013-06-23: 100 mg via ORAL
  Filled 2013-06-23: qty 2

## 2013-06-23 MED ORDER — BISACODYL 5 MG PO TBEC
5.0000 mg | DELAYED_RELEASE_TABLET | Freq: Every day | ORAL | Status: DC | PRN
  Administered 2013-06-25: 5 mg via ORAL
  Filled 2013-06-23: qty 1

## 2013-06-23 MED ORDER — MIDAZOLAM HCL 5 MG/ML IJ SOLN
INTRAMUSCULAR | Status: AC
  Filled 2013-06-23: qty 2

## 2013-06-23 MED ORDER — VANCOMYCIN HCL IN DEXTROSE 750-5 MG/150ML-% IV SOLN
750.0000 mg | Freq: Two times a day (BID) | INTRAVENOUS | Status: DC
  Administered 2013-06-23 – 2013-06-24 (×2): 750 mg via INTRAVENOUS
  Filled 2013-06-23 (×3): qty 150

## 2013-06-23 MED ORDER — ENSURE PUDDING PO PUDG
1.0000 | ORAL | Status: DC
  Administered 2013-06-24: 1 via ORAL

## 2013-06-23 NOTE — Progress Notes (Addendum)
[facility] PROGRESS NOTE  [name] HYQ:[id number] DOB: 1946/01/15 DOA: 06/22/2013 PCP: Nicki Reaper, NP  Assessment/Plan  DOE with nausea/vomiting, possibly anginal equivalent.   -  Troponins neg -  Telemetry:  NSR.  Okay to d/c telemetry  Lab Results  Component Value Date   HGBA1C 7.2* 03/21/2013   Lab Results  Component Value Date   CHOL 121 05/07/2013   HDL 27.60* 05/07/2013   LDLCALC 67 05/07/2013   TRIG 132.0 05/07/2013   CHOLHDL 4 05/07/2013    Nausea and vomiting.  Differential diagnosis includes medication side effect, GERD, peptic ulcer disease, gastroparesis.  Agree that he has risk factors for mesenteric ischemia, and if further workup is unremarkable, would recommend CT angio to rule this out.  Would need peri-CT fluids given his CKD.  Also consider central causes of persistent nausea - at risk for CVA also.   -  EGD demonstrated erosive gastritis -  Continue Zofran, Phenergan, Reglan -  Continue Protonix -  Appreciate GI recommendations -  Plan for gastric emptying study in AM -  Consider CTA abdomen and/or MRI brain pending EGD eval -  D/c narcotics and start ultram prn pain  Gram-positive cocci in clusters from one out of 2 blood cultures from September 15.  Possibly contaminant as patient is afebrile and clinically well. -  Start vancomycin -  Repeat blood cultures -  Followup speciation and sensitivities  Paroxysmal atrial fibrillation. Rhythm controlled -  Continue amiodarone -  Restart Apixaban  Coronary artery disease. Continue aspirin, beta blocker and statin -  Continue Imdur -  Continue nitroglycerin when necessary  Ischemic cardiomyopathy with ejection fraction of 35%. - Continue beta blocker, ACE inhibitor -  Restart Lasix -  Consider addition of Spironolactone  Severe peripheral vascular disease -  Continue aspirin  Left groin graft site infection -  Continue ciprofloxacin -  Appreciate wound care assistance with wound  VAC  COPD, stable. Continue albuterol when necessary  BPH, stable. Continue Flomax  Severe protein Calorie malnutrition - Appreciate Nutrition assistance -  Liberalize diet (but with 2gm salt) -  Ensure supplements  Hyponatremia, likely due to CHF and persistent vomiting. -  Repeat BMP in a.m.  Chronic kidney disease stage III, minimize nephrotoxins -  Renally dosed medications  Constipation -  Start colace, senna with prn bisacodyl and miralax  Diet:  Regular, 2gm salt Access:  PIV IVF:  Off Proph:  SCD  Code Status: Full Family Communication: Patient alone Disposition Plan: Pending able to tolerate diet   Consultants:  Gastroenterology  Procedures:  None  Antibiotics:  Ciprofloxacin   HPI/Subjective:  Somewhat improved nausea and tolerated dinner last night.    Objective: Filed Vitals:   06/23/13 1040 06/23/13 1042 06/23/13 1052 06/23/13 1100  BP:  138/50 138/51 135/53  Pulse: 61 59    Temp:  97.5 F (36.4 C)    TempSrc:  Oral    Resp: 15 13 14 12   Height:      Weight:      SpO2: 96% 96% 94% 98%    Intake/Output Summary (Last 24 hours) at 06/23/13 1107 Last data filed at 06/23/13 0645  Gross per 24 hour  Intake 265.67 ml  Output   1500 ml  Net -1234.33 ml   Filed Weights   06/22/13 0719 06/23/13 0559  Weight: 83.2 kg (183 lb 6.8 oz) 81.784 kg (180 lb 4.8 oz)    Exam:   General:  Caucasian male, No acute distress  HEENT:  NCAT, MMM  Cardiovascular:  Distant heart sounds, regular rate and rhythm, nl S1, S2, 3/6 systolic murmur at the left sternal border and apex, warm extremities  Respiratory:  CTAB, no increased WOB  Abdomen:   NABS, soft, NT/ND  MSK:   Normal tone and bulk, trace LEE,  2+ right pedal pulse, 1+ left pedal pulse. Left groin with wound VAC in place, good seal, nonerythematous.  Neuro:  Grossly intact  Data Reviewed: Basic Metabolic Panel:  Recent Labs Lab 06/22/13 0235  NA 130*  K 3.9  CL 90*  CO2 25   GLUCOSE 104*  BUN 34*  CREATININE 1.60*  CALCIUM 9.7  MG 2.3   Liver Function Tests:  Recent Labs Lab 06/22/13 0235  AST 20  ALT 20  ALKPHOS 73  BILITOT 0.4  PROT 7.8  ALBUMIN 3.7    Recent Labs Lab 06/22/13 0235  LIPASE 24   No results found for this basename: AMMONIA,  in the last 168 hours CBC:  Recent Labs Lab 06/18/13 0545 06/22/13 0235  WBC 9.3 8.8  NEUTROABS  --  6.6  HGB 9.5* 10.1*  HCT 27.8* 29.6*  MCV 91.1 90.2  PLT 216 289   Cardiac Enzymes:  Recent Labs Lab 06/22/13 0653 06/22/13 1428 06/22/13 2006  TROPONINI <0.30 <0.30 <0.30   BNP (last 3 results)  Recent Labs  03/21/13 1916 05/28/13 0103 06/22/13 0235  PROBNP 2099.0* 7588.0* 3043.0*   CBG:  Recent Labs Lab 06/22/13 0126  GLUCAP 110*    Recent Results (from the past 240 hour(s))  CULTURE, BLOOD (ROUTINE X 2)     Status: None   Collection Time    06/22/13  2:43 AM      Result Value Range Status   Specimen Description BLOOD RIGHT ARM   Final   Special Requests BOTTLES DRAWN AEROBIC AND ANAEROBIC [facility] EACH   Final   Culture  Setup Time     Final   Value: 06/22/2013 11:02     Performed at [facility]   Culture     Final   Value:        BLOOD CULTURE RECEIVED NO GROWTH TO DATE CULTURE WILL BE HELD FOR 5 DAYS BEFORE ISSUING A FINAL NEGATIVE REPORT     Performed at [facility]   Report Status PENDING   Incomplete  CULTURE, BLOOD (ROUTINE X 2)     Status: None   Collection Time    06/22/13  2:50 AM      Result Value Range Status   Specimen Description BLOOD RIGHT FOREARM   Final   Special Requests BOTTLES DRAWN AEROBIC AND ANAEROBIC [facility] EACH   Final   Culture  Setup Time     Final   Value: 06/22/2013 11:02     Performed at [facility]   Culture     Final   Value: GRAM POSITIVE COCCI IN CLUSTERS     Note: Gram Stain Report Called to,Read Back By and Verified With: NELLIE BUCK @ [phone] 06/23/13 BY KRAWS     Performed at [facility]    Report Status PENDING   Incomplete     Studies: Dg Chest 2 View  06/22/2013   CLINICAL DATA:  Nausea, emesis, shortness of breath.  EXAM: CHEST  2 VIEW  COMPARISON:  06/01/2013.  FINDINGS: No cardiomegaly. Aortic atherosclerosis, status post CABG. Postsurgical changes to the left chest wall. No edema, effusion, infiltrate, or pneumothorax. Calcified granuloma in the left mid lung.  Osteopenia.  IMPRESSION: 1. No evidence of acute cardiopulmonary disease. 2. Status post CABG.   Electronically Signed   By: Tiburcio Pea   On: 06/22/2013 03:41    Scheduled Meds: . amiodarone  200 mg Oral Daily  . amLODipine  5 mg Oral Daily  . aspirin  81 mg Oral Daily  . atorvastatin  80 mg Oral QPM  . carvedilol  6.25 mg Oral BID WC  . ciprofloxacin  500 mg Oral BID  . enalapril  5 mg Oral BID  . isosorbide mononitrate  30 mg Oral Daily  . metoCLOPramide  5 mg Oral QID  . pantoprazole  40 mg Oral BID AC  . tamsulosin  0.4 mg Oral Daily   Continuous Infusions: . sodium chloride Stopped (06/23/13 1000)    Principal Problem:   Equivalent angina Active Problems:   CAD (coronary artery disease)   HTN (hypertension)   A-fib   PVD (peripheral vascular disease)   CKD (chronic kidney disease), stage II   Surgical wound infection   Intractable nausea and vomiting   Acute gastritis without mention of hemorrhage    Time spent: 30 min    Larri Brewton, [facility]  [facility] Pager [phone]. If 7PM-7AM, please contact night-coverage at [email or web address], password [email or web address] 06/23/2013, 11:07 AM  LOS: 1 day
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
## 2013-06-23 NOTE — Op Note (Signed)
[facility] [address] [place], [address]   ENDOSCOPY PROCEDURE REPORT  PATIENT: [name], [name]  MR#: [id number] BIRTHDATE: 03-01-1946 , 67  yrs. old GENDER: Male ENDOSCOPIST: Roxy Cedar, MD REFERRED BY:  [facility] PROCEDURE DATE:  06/23/2013 PROCEDURE:  EGD w/ biopsy ASA CLASS:     Class III INDICATIONS:  Nausea.   Vomiting. MEDICATIONS: Versed 5 mg IV and Fentanyl 50 mcg IV TOPICAL ANESTHETIC: Cetacaine Spray  DESCRIPTION OF PROCEDURE: After the risks benefits and alternatives of the procedure were thoroughly explained, informed consent was obtained.  The Pentax Gastroscope [id number] endoscope was introduced through the mouth and advanced to the third portion of the duodenum. Without limitations.  The instrument was slowly withdrawn as the mucosa was fully examined.     EXAM::Normal esophagus.  Proximal stomach revealed retching gastropathy.  Gastric body was normal.  Gastric antrum revealed multiple erosions.  CLO biopsy taken.  Duodenal bulb was normal. 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  Otherwise normal.  Retroflexed views revealed no abnormalities.     The scope was then withdrawn from the patient and the procedure completed.  COMPLICATIONS: There were no complications.  ENDOSCOPIC IMPRESSION: 1. Erosive gastritis status post CLOtest 2. Etiology of nausea and vomiting not clearly related to the above finding. Further treatment and workup as below  RECOMMENDATIONS: 1.  Continue PPI twice a day, for now 2.  Rx CLO if positive 3.  Gastric Emptying Scan in a.m. to rule out gastroparesis.  REPEAT EXAM:  eSigned:  Roxy Cedar, MD 06/23/2013 10:27 AM   XB:JYNWGN Nahser, MD; Eula Flax and The Patient
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
## 2013-06-23 NOTE — Interval H&P Note (Signed)
History and Physical Interval Note:  06/23/2013 9:54 AM  [name]  has presented today for surgery, with the diagnosis of nausea  The various methods of treatment have been discussed with the patient and family. After consideration of risks, benefits and other options for treatment, the patient has consented to  Procedure(s): ESOPHAGOGASTRODUODENOSCOPY (EGD) (N/A) as a surgical intervention .  The patient's history has been reviewed, patient examined, no change in status, stable for surgery.  I have reviewed the patient's chart and labs.  Questions were answered to the patient's satisfaction.     [name]

## 2013-06-23 NOTE — Progress Notes (Signed)
Informed Tiffany in lab that Dr. Malachi Bonds wanted another blood culture done as the one on 06/22/13 is gram positive with cocci & clusters.  Amanda Pea, Charity fundraiser.

## 2013-06-23 NOTE — Progress Notes (Addendum)
Pt requesting for something to help him have a BM.  Stated "no BM since day before yesterday."  Dr. Waldron [facility] notified and Pt made aware.  Amanda Pea, Charity fundraiser.

## 2013-06-23 NOTE — Progress Notes (Signed)
ANTIBIOTIC CONSULT NOTE - INITIAL  Pharmacy Consult for Vancomycin Indication: Empiric for Gm+Cocci in 1/2 Blood Cultures  No Known Allergies  Patient Measurements: Height: 5' 8.5" (174 cm) Weight: 180 lb 4.8 oz (81.784 kg) (a scale) IBW/kg (Calculated) : 69.55 Adjusted Body Weight: 73.1kg (using (TBW-IBW)x0.3)  Vital Signs: Temp: 97.5 F (36.4 C) (09/16 1042) Temp src: Oral (09/16 1042) BP: 132/58 mmHg (09/16 1216) Pulse Rate: 59 (09/16 1042) Intake/Output from previous day: 09/15 0701 - 09/16 0700 In: 265.7 [P.O.:240; I.V.:25.7] Out: 1500 [Urine:1500] Intake/Output from this shift: Total I/O In: 240 [P.O.:240] Out: 0   Labs:  Recent Labs  06/22/13 0235  WBC 8.8  HGB 10.1*  PLT 289  CREATININE 1.60*   Estimated Creatinine Clearance: 42.9 ml/min (by C-G formula based on Cr of 1.6).  Microbiology: Recent Results (from the past 720 hour(s))  WOUND CULTURE     Status: None   Collection Time    06/09/13 10:30 AM      Result Value Range Status   Gram Stain No WBC Seen   Final   Gram Stain No Squamous Epithelial Cells Seen   Final   Gram Stain Few GRAM POSITIVE COCCI IN PAIRS   Final   Organism ID, Bacteria Multiple Organisms Present,None Predominant   Final   Comment: No Staphylococcus aureus isolated     NO GROUP A STREP (S. PYOGENES) ISOLATED  CULTURE, BLOOD (ROUTINE X 2)     Status: None   Collection Time    06/22/13  2:43 AM      Result Value Range Status   Specimen Description BLOOD RIGHT ARM   Final   Special Requests BOTTLES DRAWN AEROBIC AND ANAEROBIC [facility] EACH   Final   Culture  Setup Time     Final   Value: 06/22/2013 11:02     Performed at [facility]   Culture     Final   Value:        BLOOD CULTURE RECEIVED NO GROWTH TO DATE CULTURE WILL BE HELD FOR 5 DAYS BEFORE ISSUING A FINAL NEGATIVE REPORT     Performed at [facility]   Report Status PENDING   Incomplete  CULTURE, BLOOD (ROUTINE X 2)     Status: None   Collection Time     06/22/13  2:50 AM      Result Value Range Status   Specimen Description BLOOD RIGHT FOREARM   Final   Special Requests BOTTLES DRAWN AEROBIC AND ANAEROBIC [facility] EACH   Final   Culture  Setup Time     Final   Value: 06/22/2013 11:02     Performed at [facility]   Culture     Final   Value: GRAM POSITIVE COCCI IN CLUSTERS     Note: Gram Stain Report Called to,Read Back By and Verified With: NELLIE BUCK @ [phone] 06/23/13 BY KRAWS     Performed at [facility]   Report Status PENDING   Incomplete    Medical History: Past Medical History  Diagnosis Date  . Hyperlipidemia   . Hypertension   . PVD (peripheral vascular disease)     prior extensive endarterectomy of the right external iliac, common femoral bypass, prior R CEA. R iliac disease noted during 03/2013 cath.  . Ischemic leg 2009    with extensive endarterectomy  . Chronic systolic CHF (congestive heart failure)     a. EF 35% by cath 02/2012. b. EF 25-30% by echo 03/2013.  Marland Kitchen Anemia  Instructed Feb 19, 2012 to f/u with PCP  . Nocturnal oxygen desaturation 02/19/2012  . COPD (chronic obstructive pulmonary disease)     a. Suspected during admit 03/2013.  Marland Kitchen Atrial fibrillation     a. Transient during 03/2013 admission, started on apixaban/amiodarone.  . Anginal pain   . Ischemic cardiomyopathy   . CKD (chronic kidney disease), stage II   . PAD (peripheral artery disease)   . Pneumonia     "haven't had it since I was in my late Feb 19, 2023" (06/09/2013)  . Rheumatic fever ~ 1947  . Exertional shortness of breath   . Peptic ulcer 1960's    "tx'd w/RX; gone in ~ 3 months; didn't come back" (06/09/2013)  . Arthritis     "spine, wrist" (06/09/2013)  . Depression     "hx" (06/09/2013)  . Basal cell carcinoma of face 02-18-2010  . Coronary artery disease     a. anterior MI in 19-Feb-1988. b. MI with CABG in 1997/02/18. c. Small NSTEMI s/p DES to SVG-OM 02-19-2012, newly recognized LV dysfunction at that time. d. NSTEMI 03/2013: occlusion of OM branch after touchdown  of SVG, likely the culprit, too small for PCI, for med rx.  . Myocardial infarction May 14, 198905/14/13; 03/2013    "might have been some small ones inbetween; those were the most serious" (06/09/2013)  . Anterior myocardial infarction 1988-02-19  . NSTEMI (non-ST elevated myocardial infarction) 2012-02-19  . STEMI (ST elevation myocardial infarction) 03/2013  . Complication of anesthesia     Lungs filled up with fluid when he had "gas anesthesia." (2013-06-26)  . Anxiety     "hx" (2013-06-26)  . Migraines     "stopped after sudden death episode in 19-Feb-1988" (06/26/2013)  . Stroke 02-19-08    "had temporary blindness", denies residual on Jun 26, 2013   Medications:  Anti-infectives   Start     Dose/Rate Route Frequency Ordered Stop   06-26-2013 1000  ciprofloxacin (CIPRO) tablet 500 mg     500 mg Oral 2 times daily 26-Jun-2013 0730       Assessment: 67yo male with multiple medical problems and now with 1 of 2 blood cultures with Gm+ cocci.  He remains afebrile and his WBC was wnl yesterday without leukocytosis.  He has been on oral Ciprofloxacin since his admission.  We have been asked to dose his Vancomycin to provide coverage for potential MRSA until culture data sensitivities is known.  His creatinine on 9/16 was 1.6 with an estimated crcl of 43 ml/min.  He has good UOP with I&O's of 0.8 ml/kg/hr.  He had a chest xray yesterday which showed no acute cardiopulmonary disease.    Goal of Therapy:  Vancomycin trough level 15-20 mcg/ml  Plan:  1.  Begin IV Vancomycin 750 mg every 12 hours 2.  Monitor culture data for sensitivities 3.  Monitor renal function for changes 4.  Check s/s trough level if therapy is continued > 48 hours or if renal changes  Nadara Mustard, PharmD., MS Clinical Pharmacist Pager:  [phone] Thank you for allowing pharmacy to be part of this patients care team. 06/23/2013,12:53 PM

## 2013-06-24 ENCOUNTER — Inpatient Hospital Stay (HOSPITAL_COMMUNITY): Payer: Medicare Other

## 2013-06-24 ENCOUNTER — Encounter (HOSPITAL_COMMUNITY): Payer: Self-pay | Admitting: Internal Medicine

## 2013-06-24 DIAGNOSIS — I5023 Acute on chronic systolic (congestive) heart failure: Secondary | ICD-10-CM

## 2013-06-24 DIAGNOSIS — I509 Heart failure, unspecified: Secondary | ICD-10-CM

## 2013-06-24 DIAGNOSIS — E871 Hypo-osmolality and hyponatremia: Secondary | ICD-10-CM

## 2013-06-24 LAB — CBC
MCH: 31 pg (ref 26.0–34.0)
MCHC: 34 g/dL (ref 30.0–36.0)
RDW: 13.8 % (ref 11.5–15.5)

## 2013-06-24 LAB — BASIC METABOLIC PANEL
BUN: 30 mg/dL — ABNORMAL HIGH (ref 6–23)
Calcium: 9.1 mg/dL (ref 8.4–10.5)
Creatinine, Ser: 1.48 mg/dL — ABNORMAL HIGH (ref 0.50–1.35)
GFR calc Af Amer: 54 mL/min — ABNORMAL LOW (ref >90)
GFR calc non Af Amer: 47 mL/min — ABNORMAL LOW (ref >90)
Glucose, Bld: 88 mg/dL (ref 70–99)

## 2013-06-24 LAB — CULTURE, BLOOD (ROUTINE X 2)

## 2013-06-24 MED ORDER — FUROSEMIDE 10 MG/ML IJ SOLN
40.0000 mg | Freq: Once | INTRAMUSCULAR | Status: AC
  Administered 2013-06-24: 40 mg via INTRAVENOUS
  Filled 2013-06-24: qty 4

## 2013-06-24 MED ORDER — OXYCODONE HCL 5 MG PO TABS
5.0000 mg | ORAL_TABLET | Freq: Four times a day (QID) | ORAL | Status: DC | PRN
  Administered 2013-06-24 – 2013-06-25 (×2): 10 mg via ORAL
  Filled 2013-06-24 (×2): qty 2

## 2013-06-24 MED ORDER — TECHNETIUM TC 99M SULFUR COLLOID
2.0000 | Freq: Once | INTRAVENOUS | Status: AC | PRN
  Administered 2013-06-24: 2 via INTRAVENOUS

## 2013-06-24 MED ORDER — HYDROCODONE-ACETAMINOPHEN 10-325 MG PO TABS
1.0000 | ORAL_TABLET | Freq: Once | ORAL | Status: AC
  Administered 2013-06-24: 1 via ORAL
  Filled 2013-06-24: qty 1

## 2013-06-24 MED ORDER — SPIRONOLACTONE 25 MG PO TABS
25.0000 mg | ORAL_TABLET | Freq: Every day | ORAL | Status: DC
  Administered 2013-06-24 – 2013-06-25 (×2): 25 mg via ORAL
  Filled 2013-06-24 (×2): qty 1

## 2013-06-24 NOTE — Consult Note (Signed)
Wound care follow-up: Vac dressing changed to left inner groin wound.  Wound 10% yellow, 90% red.  Some odor, mod amt yellow drainage in cannister.  Applied Mepitel contact layer and one piece black foam to cont suction at .  Pt tolerated without c/o pain.  Freedom vac at bedside and this can be resumed when he returns home. Bedside nurse can change dressing on M/W/F. Please re-consult if further assistance is needed.  Thank-you,  Cammie Mcgee MSN, RN, CWOCN, [place], CNS [phone]

## 2013-06-24 NOTE — Progress Notes (Signed)
     [facility] Daily Rounding Note 06/24/2013, 8:52 AM  SUBJECTIVE:       Narcotics and anitemetics held after 0100 to allow for accurate gastric emptying study.  I see that one dose of hydrocodone was ordered at 0150.   OBJECTIVE:         Vital signs in last 24 hours:    Temp:  [97.1 F (36.2 C)-98.2 F (36.8 C)] 98.2 F (36.8 C) (09/17 0544) Pulse Rate:  [58-63] 59 (09/17 0544) Resp:  [12-31] 18 (09/17 0544) BP: (115-163)/(44-79) 135/64 mmHg (09/17 0544) SpO2:  [89 %-98 %] 93 % (09/17 0544) Weight:  [84.4 kg (186 lb 1.1 oz)] 84.4 kg (186 lb 1.1 oz) (09/17 0544) Last BM Date: 06/14/13 General: No acute distress Heart: Regular Chest: Clear Abdomen: Soft  Extremities: No edema Neuro/Psych: Alert  Intake/Output from previous day: 09/16 0701 - 09/17 0700 In: 720 [P.O.:720] Out: 2100 [Urine:2100]  Intake/Output this shift:    Lab Results:  Recent Labs  06/22/13 0235 06/24/13 0630  WBC 8.8 9.3  HGB 10.1* 9.8*  HCT 29.6* 28.8*  PLT 289 252   BMET  Recent Labs  06/22/13 0235 06/24/13 0630  NA 130* 132*  K 3.9 4.2  CL 90* 95*  CO2 25 26  GLUCOSE 104* 88  BUN 34* 30*  CREATININE 1.60* 1.48*  CALCIUM 9.7 9.1   LFT  Recent Labs  06/22/13 0235  PROT 7.8  ALBUMIN 3.7  AST 20  ALT 20  ALKPHOS 73  BILITOT 0.4   PT/INR  Recent Labs  06/22/13 0235  LABPROT 16.2*  INR 1.33    ASSESMENT: *  N/V beginning after 03/2013 STEMI when multiple new meds started EGD 9/17: erosive gastritis not clearly the cause for N/V.  Gastric emptying study pending for today.  * CAD. 1998 CABG. non-STEMI with DES 02/2012. 03/2013 STEMI * ASPVD. Stenosis of right common iliac stent: s/p Right Iliac endarterectomy and angioplasty 05/12/13. S/p 05/22/13 left external iliac-profunda femoral bypass graft. Infection of left groin wound is improving with wound vac.  Right fem-pop bypass in past . S/p 2004 bil iliac stenting. Right CEA 2009. * Normocytic anemia. *  Acute kidney  injury.    PLAN: *  Await GES. Restart narcotics and antiemetics afterwards.    LOS: 2 days   Jennye Moccasin  06/24/2013, 8:52 AM Pager: [phone]  GI ATTENDING  As above. Patient still in nuclear medicine. Will followup tomorrow. In the interim, continue PPI twice a day and followup CLO testing.  Wilhemina Bonito. Eda Keys., M.D. [facility] Division of Gastroenterology

## 2013-06-24 NOTE — Progress Notes (Signed)
Pt. C/o pain 8/10 in groin area. PRN pain medication administered to pt. Pt. Stated that the pain medication given did not help him any and he wanted something stronger. Pt. Stated that he had received Vicodin previously and it helped his pain for at least 12 hours.  Pt. Agreed to take Tylenol. RN informed pt. That he also had Tramadol ordered that could be administered if the Tylenol was ineffective. Pt. Stated he did not want the tramadol.  Pt. Stated if he could not get Vicodin, he would have his wife bring his medication from home and he would take it himself. RN informed pt. That it is not recommended that he take his home medications while being admitted to the the hospital. RN also informed pt. Of the risk associated with taking home meds. RN informed pt. That she would check into the matter to help him better manage his pain. On call NP, K. Schorr, ([facility]), informed of pts. Issue/request. New orders received and will be implemented as ordered. RN will continue to monitor pt. For changes in condition. Andria Head, Cheryll Dessert

## 2013-06-24 NOTE — Progress Notes (Signed)
Patient has been NPO and pain medication has been given along with medication that was held for procedure; will continue to monitor patient. Lorretta Harp RN

## 2013-06-24 NOTE — Progress Notes (Signed)
Patient is NPO for procedure and no meds has been given; will continue to monitor patient.  Lorretta Harp RN

## 2013-06-24 NOTE — Progress Notes (Signed)
Patient has returned to unit from procedure; will continue to monitor patient.  Patient is having 7/10 pain, will treat and monitor.  Lorretta Harp RN

## 2013-06-24 NOTE — Progress Notes (Signed)
Patient states his pain has been relieved; will continue to monitor patient. Lorretta Harp RN

## 2013-06-24 NOTE — Progress Notes (Signed)
Patient is still NPO with no meds given for procedure; will continue to monitor patient. Lorretta Harp RN

## 2013-06-24 NOTE — Progress Notes (Addendum)
[facility] PROGRESS NOTE Interim History:  Intake/Output Summary (Last 24 hours) at 06/24/13 0942 Last data filed at 06/24/13 0607  Gross per 24 hour  Intake    720 ml  Output   2100 ml  Net  -1380 ml       Filed Weights   06/22/13 0719 06/23/13 0559 06/24/13 0544  Weight: 83.2 kg (183 lb 6.8 oz) 81.784 kg (180 lb 4.8 oz) 84.4 kg (186 lb 1.1 oz)       Recent Labs  03/21/13 1916 05/28/13 0103 06/22/13 0235  PROBNP 2099.0* 7588.0* 3043.0*   ECHO: 9.16.2014: estimated ejection fraction was 35%.  Assessment/Plan: Acute on chronic systolic congestive heart failure, NYHA class 2: - Negative about 2.6 L since admission. Weight has increase from 83.2->84.4 kg. - Creatinine trending down. Hyponatremia improving cont to have orthopnea. - Check BNP in am. - Cont lasix, coreg and Imdur. Add spironolactone. - will need to be evaluated as an outpatient for pacer.  Erosive gastritis: - EGD on 9.16.2014 showed erosive gastritis on PPI twice a day. - Appreciate GI assistance.  Paroxysmal atrial fibrillation: - Rhythm controlled  - Continue amiodarone  - Resume Apixaban.  Coronary artery disease: - Continue aspirin, beta blocker and statin. - Continue Imdur. - Continue nitroglycerin PRN.  Severe peripheral vascular disease  - Continue aspirin   Left groin graft site infection: - Continue ciprofloxacin  - Appreciate wound care assistance with wound VAC  CKD (chronic kidney disease), stage II: - Baseline Cr 1.3. - On admision 1.6,now trending down.   Code Status: Full  Family Communication: Patient alone  Disposition Plan: Pending able to tolerate diet   Consultants:  gastroenterology  Procedures:  EDG  Antibiotics:  cipro  D/c vanc  HPI/Subjective: Feel like his nausea an vomiting are improved.  Objective: Filed Vitals:   06/23/13 1744 06/23/13 2041 06/23/13 2300 06/24/13 0544  BP: 120/54 137/58 129/44 135/64  Pulse: 58 62 63 59  Temp:  98 F  (36.7 C)  98.2 F (36.8 C)  TempSrc:  Oral  Oral  Resp:  17  18  Height:      Weight:    84.4 kg (186 lb 1.1 oz)  SpO2:  92%  93%     Exam:  General: Alert, awake, oriented x3, in no acute distress.  HEENT: No bruits, no goiter. +JVD Heart: Regular rate and rhythm, without murmurs, rubs, gallops.  Lungs: Good air movement, crackles at left lung base. Abdomen: Soft, nontender, nondistended, positive bowel sounds.  Neuro: Grossly intact, nonfocal.   Data Reviewed: Basic Metabolic Panel:  Recent Labs Lab 06/22/13 0235 06/24/13 0630  NA 130* 132*  K 3.9 4.2  CL 90* 95*  CO2 25 26  GLUCOSE 104* 88  BUN 34* 30*  CREATININE 1.60* 1.48*  CALCIUM 9.7 9.1  MG 2.3  --    Liver Function Tests:  Recent Labs Lab 06/22/13 0235  AST 20  ALT 20  ALKPHOS 73  BILITOT 0.4  PROT 7.8  ALBUMIN 3.7    Recent Labs Lab 06/22/13 0235  LIPASE 24   No results found for this basename: AMMONIA,  in the last 168 hours CBC:  Recent Labs Lab 06/18/13 0545 06/22/13 0235 06/24/13 0630  WBC 9.3 8.8 9.3  NEUTROABS  --  6.6  --   HGB 9.5* 10.1* 9.8*  HCT 27.8* 29.6* 28.8*  MCV 91.1 90.2 91.1  PLT 216 289 252   Cardiac Enzymes:  Recent Labs Lab 06/22/13 0653 06/22/13  1428 06/22/13 2006  TROPONINI <0.30 <0.30 <0.30   BNP (last 3 results)  Recent Labs  03/21/13 1916 05/28/13 0103 06/22/13 0235  PROBNP 2099.0* 7588.0* 3043.0*   CBG:  Recent Labs Lab 06/22/13 0126  GLUCAP 110*    Recent Results (from the past 240 hour(s))  CULTURE, BLOOD (ROUTINE X 2)     Status: None   Collection Time    06/22/13  2:43 AM      Result Value Range Status   Specimen Description BLOOD RIGHT ARM   Final   Special Requests BOTTLES DRAWN AEROBIC AND ANAEROBIC [facility] EACH   Final   Culture  Setup Time     Final   Value: 06/22/2013 11:02     Performed at [facility]   Culture     Final   Value:        BLOOD CULTURE RECEIVED NO GROWTH TO DATE CULTURE WILL BE HELD FOR 5  DAYS BEFORE ISSUING A FINAL NEGATIVE REPORT     Performed at [facility]   Report Status PENDING   Incomplete  CULTURE, BLOOD (ROUTINE X 2)     Status: None   Collection Time    06/22/13  2:50 AM      Result Value Range Status   Specimen Description BLOOD RIGHT FOREARM   Final   Special Requests BOTTLES DRAWN AEROBIC AND ANAEROBIC [facility] EACH   Final   Culture  Setup Time     Final   Value: 06/22/2013 11:02     Performed at [facility]   Culture     Final   Value: GRAM POSITIVE COCCI IN CLUSTERS     Note: Gram Stain Report Called to,Read Back By and Verified With: NELLIE BUCK @ [phone] 06/23/13 BY KRAWS     Performed at [facility]   Report Status PENDING   Incomplete  CULTURE, BLOOD (ROUTINE X 2)     Status: None   Collection Time    06/23/13  1:18 PM      Result Value Range Status   Specimen Description BLOOD RIGHT ANTECUBITAL   Final   Special Requests     Final   Value: BOTTLES DRAWN AEROBIC AND ANAEROBIC 10CC AER 5CC ANA   Culture  Setup Time     Final   Value: 06/23/2013 16:43     Performed at [facility]   Culture     Final   Value:        BLOOD CULTURE RECEIVED NO GROWTH TO DATE CULTURE WILL BE HELD FOR 5 DAYS BEFORE ISSUING A FINAL NEGATIVE REPORT     Performed at [facility]   Report Status PENDING   Incomplete  CULTURE, BLOOD (ROUTINE X 2)     Status: None   Collection Time    06/23/13  1:23 PM      Result Value Range Status   Specimen Description BLOOD LEFT ANTECUBITAL   Final   Special Requests BOTTLES DRAWN AEROBIC ONLY 10CC   Final   Culture  Setup Time     Final   Value: 06/23/2013 16:43     Performed at [facility]   Culture     Final   Value:        BLOOD CULTURE RECEIVED NO GROWTH TO DATE CULTURE WILL BE HELD FOR 5 DAYS BEFORE ISSUING A FINAL NEGATIVE REPORT     Performed at [facility]   Report Status PENDING  Incomplete     Studies: No results found.  Scheduled Meds: . amiodarone  200 mg  Oral Daily  . amLODipine  5 mg Oral Daily  . apixaban  5 mg Oral BID  . aspirin  81 mg Oral Daily  . atorvastatin  80 mg Oral QPM  . carvedilol  6.25 mg Oral BID WC  . ciprofloxacin  500 mg Oral BID  . docusate sodium  100 mg Oral BID  . enalapril  5 mg Oral BID  . feeding supplement  1 Container Oral Q24H  . furosemide  40 mg Oral Daily  . isosorbide mononitrate  30 mg Oral Daily  . multivitamin with minerals  1 tablet Oral Daily  . pantoprazole  40 mg Oral BID AC  . senna  2 tablet Oral QHS  . tamsulosin  0.4 mg Oral Daily  . vancomycin  750 mg Intravenous Q12H   Continuous Infusions:    Marinda Elk  [facility] Pager [phone]. If 8PM-8AM, please contact night-coverage at [email or web address], password [facility] 06/24/2013, 9:42 AM  LOS: 2 days

## 2013-06-25 DIAGNOSIS — K2960 Other gastritis without bleeding: Principal | ICD-10-CM

## 2013-06-25 DIAGNOSIS — N182 Chronic kidney disease, stage 2 (mild): Secondary | ICD-10-CM

## 2013-06-25 DIAGNOSIS — F329 Major depressive disorder, single episode, unspecified: Secondary | ICD-10-CM

## 2013-06-25 LAB — BASIC METABOLIC PANEL
Calcium: 9 mg/dL (ref 8.4–10.5)
GFR calc Af Amer: 58 mL/min — ABNORMAL LOW (ref >90)
GFR calc non Af Amer: 50 mL/min — ABNORMAL LOW (ref >90)
Glucose, Bld: 93 mg/dL (ref 70–99)
Potassium: 4 mEq/L (ref 3.5–5.1)
Sodium: 128 mEq/L — ABNORMAL LOW (ref 135–145)

## 2013-06-25 MED ORDER — PROMETHAZINE HCL 25 MG PO TABS: 25.0000 mg | ORAL_TABLET | Freq: Four times a day (QID) | ORAL | Status: DC | PRN

## 2013-06-25 MED ORDER — ONDANSETRON HCL 4 MG/2ML IJ SOLN
INTRAMUSCULAR | Status: AC
  Filled 2013-06-25: qty 2

## 2013-06-25 MED ORDER — PROMETHAZINE HCL 25 MG RE SUPP: 25.0000 mg | Freq: Four times a day (QID) | RECTAL | Status: DC | PRN

## 2013-06-25 MED ORDER — SPIRONOLACTONE 25 MG PO TABS: 25.0000 mg | ORAL_TABLET | Freq: Every day | ORAL | Status: DC

## 2013-06-25 MED ORDER — PROMETHAZINE HCL 25 MG PO TABS
25.0000 mg | ORAL_TABLET | Freq: Four times a day (QID) | ORAL | Status: DC | PRN
  Administered 2013-06-25: 08:00:00 25 mg via ORAL
  Filled 2013-06-25: qty 1

## 2013-06-25 MED ORDER — PANTOPRAZOLE SODIUM 40 MG PO TBEC: 40.0000 mg | DELAYED_RELEASE_TABLET | Freq: Two times a day (BID) | ORAL | Status: DC

## 2013-06-25 MED ORDER — PROMETHAZINE HCL 25 MG/ML IJ SOLN
25.0000 mg | Freq: Four times a day (QID) | INTRAMUSCULAR | Status: DC | PRN
  Filled 2013-06-25: qty 1

## 2013-06-25 MED ORDER — PANTOPRAZOLE SODIUM 40 MG PO TBEC
40.0000 mg | DELAYED_RELEASE_TABLET | Freq: Every day | ORAL | Status: DC
  Filled 2013-06-25: qty 1

## 2013-06-25 NOTE — Progress Notes (Addendum)
[facility] Daily Rounding Note 06/25/2013, 8:31 AM  SUBJECTIVE:       Oxycodone restarted after GES yesterday. Nausea is better.  Feels ready to go home  OBJECTIVE:         Vital signs in last 24 hours:    Temp:  [97.6 F (36.4 C)-98.4 F (36.9 C)] 98.4 F (36.9 C) (09/18 0629) Pulse Rate:  [59-67] 67 (09/18 0757) Resp:  [16-18] 18 (09/18 0757) BP: (116-152)/(50-61) 137/59 mmHg (09/18 0757) SpO2:  [93 %-98 %] 97 % (09/18 0757) Weight:  [82.509 kg (181 lb 14.4 oz)] 82.509 kg (181 lb 14.4 oz) (09/18 0629) Last BM Date: 06/24/13 General: pleasant, looks better   Heart: RRR Chest: clear bil.  No labored breathing Abdomen: soft, active BS, NT, ND  Extremities: no CCE Neuro/Psych:  Pleasant, relaxed.  No confusion  Intake/Output from previous day: 09/17 0701 - 09/18 0700 In: 240 [P.O.:240] Out: 2350 [Urine:2350]  Intake/Output this shift:    Lab Results:  Recent Labs  06/24/13 0630  WBC 9.3  HGB 9.8*  HCT 28.8*  PLT 252   BMET  Recent Labs  06/24/13 0630 06/25/13 0625  NA 132* 128*  K 4.2 4.0  CL 95* 92*  CO2 26 26  GLUCOSE 88 93  BUN 30* 23  CREATININE 1.48* 1.39*  CALCIUM 9.1 9.0    Studies/Results: Nm Gastric Emptying 06/24/2013   FINDINGS: Expected location of the stomach in the left upper quadrant. Ingested meal empties the stomach gradually over the course of the study with 40 % retention at 60 min and 9 % retention at 120 min (normal retention less than 30% at a 120 min).  IMPRESSION: Normal gastric emptying study.   Electronically Signed   By: Richarda Overlie M.D.   On: 06/24/2013 17:25    ASSESMENT: * N/V beginning after 03/2013 STEMI when multiple new meds started.   sxs refractory to low dose Reglan.  EGD 9/16: erosive gastritis not clearly the cause for N/V.   Gastric emptying study (GES) 9/17 is normal. H Pylori clotest is negative.  Meds are likely culprit.  Of note is that sxs predate the use of percocet.   He also has significant vascular  disease, which generally causes post prandial pain "intestinal ischemia" * CAD. 1998 CABG. non-STEMI with DES 02/2012. 03/2013 STEMI .  On Abixaban (Eliquis) * ASPVD. Stenosis of right common iliac stent: s/p Right Iliac endarterectomy and angioplasty 05/12/13. S/p 05/22/13 left external iliac-profunda femoral bypass graft. Infection of left groin wound is improving with wound vac.  Right fem-pop bypass in past . S/p 2004 bil iliac stenting. Right CEA 2009.  * Normocytic anemia.  * Acute kidney injury. Improved.    PLAN: *  Seems stable for discharge.  Should have quick outpt follow up with cardiologist to run list of meds and perhaps make changes to try to eliminate the nausea. *  Once daily PPI, generic Omeprazole is fine. Should continue on prn anti-emtics.  *  No Reglan indicated.     LOS: 3 days   Jennye Moccasin  06/25/2013, 8:31 AM Pager: [phone]   GI ATTENDING  Interval history and laboratories reviewed. Agree with H&P as outlined above. No evidence for gastroparesis. EGD findings as previously outlined. Suspect that problems with nausea and vomiting are related to medication reaction. Recommend continuing PPI daily and antiemetics as needed. Prescribing physicians to review his medications. Finally, we will followup on CLOtest and treat if needed. Will sign off.  Docia Chuck. Geri Seminole., M.D. [facility] Division of Gastroenterology

## 2013-06-25 NOTE — Discharge Summary (Signed)
Physician Discharge Summary  [name]:[id number] DOB: 03/13/1946 DOA: 06/22/2013  PCP: Nicki Reaper, NP  Admit date: 06/22/2013 Discharge date: 06/25/2013  Time spent: 35 minutes  Recommendations for Outpatient Follow-up:  1. Follow up with cardiology as an outpatient. In 2 weeks evaluate if he cont to have nausea. If so may need to re-evaluate amiodarone therapy. Weight on D/c 82.5 kg.  Discharge Diagnoses:  Principal Problem:   Erosive gastritis Active Problems:   Acute on chronic systolic congestive heart failure, NYHA class 2   CAD (coronary artery disease)   HTN (hypertension)   A-fib   PVD (peripheral vascular disease)   CKD (chronic kidney disease), stage II   Surgical wound infection   Intractable nausea and vomiting   Unspecified constipation   Discharge Condition: stable  Diet recommendation: Low sodium diet  Filed Weights   06/23/13 0559 06/24/13 0544 06/25/13 0629  Weight: 81.784 kg (180 lb 4.8 oz) 84.4 kg (186 lb 1.1 oz) 82.509 kg (181 lb 14.4 oz)    History of present illness:  67 y.o. male with Past medical history of coronary artery disease, hypertension, but if her posterior disease, CHF with EF of 35%, hypertension, dyslipidemia, recent wound infection after bypass grafting on ciprofloxacin and wound VAC, recent non-STEMI 6/14 treated medically, A. fib on amiodarone and apixaban.  The patient presents with complaint of shortness of breath that occurred at rest twice. The second episode lasting half an hour. He mentions the episode appears similar to his prior MI but milder in intensity. He currently denies any complain of symptoms at present. He denies any weight gain, leg swelling, orthopnea, PND.  He mentions that since last one month he has persistent nausea and vomiting associated with weight loss. Based on the epic charting he has lost nearly 10 pounds, he was around 205 pounds and 6/14 and in the beginning of September was around 185 pound. He  denies any abdominal pain diarrhea or constipation. He denies any bleeding anywhere. He denies any dysphagia or substernal chest pain. He denies any acid reflux symptoms at present    Hospital Course:  Acute on chronic systolic congestive heart failure, NYHA class 2:  - Negative about 4.7 L since admission. Weight unreliable. Orthopnea resolved. - Creatinine trending down with hyponatremia, Hyponatremia improving, added low dose spironolactone. - Check BNP on d/c was 3081. - Cont lasix, coreg and Imdur andspironolactone.  Will need a b-met in 1 week - Will need to be evaluated as an outpatient for pacer.   Nausea and vomiting due to possibly Erosive gastritis:  - Initially admitted for possible cardiac origin for Nausea, cardiac markers negative x3 with no EKG changes. - consulted GI recommended EGD and Gastric emptying study. - EGD on 9.16.2014 showed erosive gastritis on PPI twice a day. His nausea and vomiting improved with PPI. - Started on PPI empirically Nausea and vomiting improved. - It is to note that pt is on cipro and amiodarone which can cause nausea. - As we have identify a cause for Nausea on EGD will give a trial of PPI for 1 week and be re-evaluated by his cardiologist if his nausea has not improved.  Paroxysmal atrial fibrillation:  - Rhythm controlled  - Continue amiodarone  - Resume Apixaban.   Coronary artery disease:  - Continue aspirin, beta blocker and statin.  - Continue Imdur.  - Continue nitroglycerin PRN.   Severe peripheral vascular disease  - Continue aspirin   Left groin graft site infection:  -  Continue ciprofloxacin.  - Appreciate wound care assistance with wound VAC   CKD (chronic kidney disease), stage II:  - Baseline Cr 1.3.  - On admision 1.6,now trending down.    Procedures:  Gastric emptying study : WNL  EGD 9.16.2014: Mild gastritis.  Consultations:  GI  Discharge Exam: Filed Vitals:   06/25/13 1048  BP: 136/67  Pulse: 63   Temp: 98.4 F (36.9 C)  Resp: 18    General: A&O x3 Cardiovascular: RRR Respiratory: good air movement CTA B/L  Discharge Instructions      Discharge Orders   Future Appointments Provider Department Dept Phone   06/30/2013 11:45 AM Pryor Ochoa, MD [facility] [phone]   06/30/2013 4:30 PM Vesta Mixer, MD [facility] Main Office [place]) [phone]   Future Orders Complete By Expires   Diet - low sodium heart healthy  As directed    Increase activity slowly  As directed        Medication List         albuterol 108 (90 BASE) MCG/ACT inhaler  Commonly known as:  PROVENTIL HFA;VENTOLIN HFA  Inhale 2 puffs into the lungs every 6 (six) hours as needed for wheezing or shortness of breath.     amiodarone 200 MG tablet  Commonly known as:  PACERONE  Take 1 tablet (200 mg total) by mouth daily.     amLODipine 5 MG tablet  Commonly known as:  NORVASC  Take 1 tablet (5 mg total) by mouth daily.     apixaban 5 MG Tabs tablet  Commonly known as:  ELIQUIS  Take 1 tablet (5 mg total) by mouth 2 (two) times daily.     aspirin 81 MG tablet  Take 1 tablet (81 mg total) by mouth daily.     atorvastatin 80 MG tablet  Commonly known as:  LIPITOR  Take 1 tablet (80 mg total) by mouth every evening.     carvedilol 6.25 MG tablet  Commonly known as:  COREG  Take 1 tablet (6.25 mg total) by mouth 2 (two) times daily with a meal.     ciprofloxacin 500 MG tablet  Commonly known as:  CIPRO  Take 1 tablet (500 mg total) by mouth 2 (two) times daily.     clobetasol 0.05 % topical foam  Commonly known as:  OLUX  Apply topically 2 (two) times daily.     enalapril 5 MG tablet  Commonly known as:  VASOTEC  Take 5 mg by mouth 2 (two) times daily.     furosemide 40 MG tablet  Commonly known as:  LASIX  Take 1 tablet (40 mg total) by mouth daily.     HYDROcodone-acetaminophen 10-325 MG per tablet  Commonly known as:  NORCO  Take 1  tablet by mouth every 6 (six) hours as needed for pain.     isosorbide mononitrate 30 MG 24 hr tablet  Commonly known as:  IMDUR  Take 30 mg by mouth daily.     loratadine 10 MG tablet  Commonly known as:  CLARITIN  Take 10 mg by mouth every evening.     metoCLOPramide 5 MG tablet  Commonly known as:  REGLAN  Take 1 tablet (5 mg total) by mouth 4 (four) times daily.     nitroGLYCERIN 0.4 MG SL tablet  Commonly known as:  NITROSTAT  Place 1 tablet (0.4 mg total) under the tongue every 5 (five) minutes as needed for chest pain.  oxyCODONE 5 MG immediate release tablet  Commonly known as:  Oxy IR/ROXICODONE  Take 1-2 tablets (5-10 mg total) by mouth every 6 (six) hours as needed for pain.     pantoprazole 40 MG tablet  Commonly known as:  PROTONIX  Take 1 tablet (40 mg total) by mouth 2 (two) times daily.  Start taking on:  06/26/2013     promethazine 25 MG tablet  Commonly known as:  PHENERGAN  Take 1 tablet (25 mg total) by mouth every 6 (six) hours as needed for nausea.     spironolactone 25 MG tablet  Commonly known as:  ALDACTONE  Take 1 tablet (25 mg total) by mouth daily.     tamsulosin 0.4 MG Caps capsule  Commonly known as:  FLOMAX  Take 1 capsule (0.4 mg total) by mouth daily.       No Known Allergies Follow-up Information   Follow up with Elyn Aquas., MD On 06/30/2013. (hospital follow up, b-met next week @ 4:30 spoke with Gwen Pounds )    Specialty:  Cardiology   Contact information:   [address] [place] [address] [phone]        The results of significant diagnostics from this hospitalization (including imaging, microbiology, ancillary and laboratory) are listed below for reference.    Significant Diagnostic Studies: Dg Chest 2 View  06/22/2013   CLINICAL DATA:  Nausea, emesis, shortness of breath.  EXAM: CHEST  2 VIEW  COMPARISON:  06/01/2013.  FINDINGS: No cardiomegaly. Aortic atherosclerosis, status post CABG.  Postsurgical changes to the left chest wall. No edema, effusion, infiltrate, or pneumothorax. Calcified granuloma in the left mid lung. Osteopenia.  IMPRESSION: 1. No evidence of acute cardiopulmonary disease. 2. Status post CABG.   Electronically Signed   By: Tiburcio Pea   On: 06/22/2013 03:41   Dg Chest 2 View  05/28/2013   *RADIOLOGY REPORT*  Clinical Data: Very artery disease.  CHEST - 2 VIEW  Comparison: Chest x-ray dated 05/21/2013  Findings: The patient has bilateral perihilar upper lobe pulmonary infiltrates.  The heart size and vascularity are normal.  Evidence of prior CABG.  Tiny left pleural effusion.  No acute osseous abnormality.  IMPRESSION: Bilateral perihilar upper lobe infiltrates.  This could represent atypical pulmonary edema but it could also represent bilateral upper lobe pneumonia.  Tiny left effusion.   Original Report Authenticated By: Francene Boyers, M.D.   Nm Gastric Emptying  06/24/2013   CLINICAL DATA:  Nausea and vomiting for a couple of weeks.  EXAM: NUCLEAR MEDICINE GASTRIC EMPTYING SCAN  TECHNIQUE: After oral ingestion of radiolabeled meal, sequential abdominal images were obtained for 120 minutes. Residual percentage of activity remaining within the stomach was calculated at 60 and 120 minutes.  COMPARISON:  None  RADIOPHARMACEUTICALS:  2.0Technetium 99-M labeled sulfur colloid  FINDINGS: Expected location of the stomach in the left upper quadrant. Ingested meal empties the stomach gradually over the course of the study with 40 % retention at 60 min and 9 % retention at 120 min (normal retention less than 30% at a 120 min).  IMPRESSION: Normal gastric emptying study.   Electronically Signed   By: Richarda Overlie M.D.   On: 06/24/2013 17:25   Nm Pulmonary Perf And Vent  05/28/2013   *RADIOLOGY REPORT*  Clinical Data: Chest pain  NM PULMONARY VENTILATION AND PERFUSION SCAN views:  Anterior, posterior, left lateral, right lateral, RPO, LPO, RAO, LAO - ventilation and perfusion   Radiopharmaceutical: Technetium 68m  DTPA - ventilation; technetium 79m macroaggregated albumin - perfusion  Dose:  40.0 mCi - ventilation; 6.0 mCi - perfusion  Route of administration:  Inhalation - ventilation; intravenous - perfusion  Comparison: Chest radiograph May 28, 2013  Findings: Ventilation study shows decreased uptake in the left upper lobe compared the right.  There are patchy ventilation defects in both lower lobes, no of which are segmental.  On the perfusion study, there are a few scattered subsegmental defects.  There are no larger perfusion defects.  There are no perfusion defects without matching ventilation defects; indeed, perfusion defects are smaller than ventilation defects.  IMPRESSION: No significant ventilation / perfusion mismatch. Low probability of pulmonary embolus.   Original Report Authenticated By: Bretta Bang, M.D.   Dg Chest Port 1 View  05/30/2013   *RADIOLOGY REPORT*  Clinical Data: Follow up infiltrates  PORTABLE CHEST - 1 VIEW  Comparison: 05/28/2013  Findings: Perihilar infiltrates seen previously have mildly improved.  There are no new lung opacities.  No pleural effusion or pneumothorax is seen.  Changes from CABG surgery are stable.  The cardiac silhouette is normal in size and configuration.  IMPRESSION: Improved perihilar infiltrates.  No new lung infiltrates.   Original Report Authenticated By: Amie Portland, M.D.    Microbiology: Recent Results (from the past 240 hour(s))  CULTURE, BLOOD (ROUTINE X 2)     Status: None   Collection Time    06/22/13  2:43 AM      Result Value Range Status   Specimen Description BLOOD RIGHT ARM   Final   Special Requests BOTTLES DRAWN AEROBIC AND ANAEROBIC [facility] EACH   Final   Culture  Setup Time     Final   Value: 06/22/2013 11:02     Performed at [facility]   Culture     Final   Value:        BLOOD CULTURE RECEIVED NO GROWTH TO DATE CULTURE WILL BE HELD FOR 5 DAYS BEFORE ISSUING A FINAL NEGATIVE REPORT      Performed at [facility]   Report Status PENDING   Incomplete  CULTURE, BLOOD (ROUTINE X 2)     Status: None   Collection Time    06/22/13  2:50 AM      Result Value Range Status   Specimen Description BLOOD RIGHT FOREARM   Final   Special Requests BOTTLES DRAWN AEROBIC AND ANAEROBIC [facility] EACH   Final   Culture  Setup Time     Final   Value: 06/22/2013 11:02     Performed at [facility]   Culture     Final   Value: STAPHYLOCOCCUS SPECIES (COAGULASE NEGATIVE)     Note: THE SIGNIFICANCE OF ISOLATING THIS ORGANISM FROM A SINGLE SET OF BLOOD CULTURES WHEN MULTIPLE SETS ARE DRAWN IS UNCERTAIN. PLEASE NOTIFY THE MICROBIOLOGY DEPARTMENT WITHIN ONE WEEK IF SPECIATION AND SENSITIVITIES ARE REQUIRED.     Note: Gram Stain Report Called to,Read Back By and Verified With: NELLIE BUCK @ [phone] 06/23/13 BY KRAWS     Performed at [facility]   Report Status 06/24/2013 FINAL   Final  CULTURE, BLOOD (ROUTINE X 2)     Status: None   Collection Time    06/23/13  1:18 PM      Result Value Range Status   Specimen Description BLOOD RIGHT ANTECUBITAL   Final   Special Requests     Final   Value: BOTTLES DRAWN AEROBIC AND ANAEROBIC 10CC AER 5CC  ANA   Culture  Setup Time     Final   Value: 06/23/2013 16:43     Performed at [facility]   Culture     Final   Value:        BLOOD CULTURE RECEIVED NO GROWTH TO DATE CULTURE WILL BE HELD FOR 5 DAYS BEFORE ISSUING A FINAL NEGATIVE REPORT     Performed at [facility]   Report Status PENDING   Incomplete  CULTURE, BLOOD (ROUTINE X 2)     Status: None   Collection Time    06/23/13  1:23 PM      Result Value Range Status   Specimen Description BLOOD LEFT ANTECUBITAL   Final   Special Requests BOTTLES DRAWN AEROBIC ONLY 10CC   Final   Culture  Setup Time     Final   Value: 06/23/2013 16:43     Performed at [facility]   Culture     Final   Value:        BLOOD CULTURE RECEIVED NO GROWTH TO DATE CULTURE WILL BE  HELD FOR 5 DAYS BEFORE ISSUING A FINAL NEGATIVE REPORT     Performed at [facility]   Report Status PENDING   Incomplete     Labs: Basic Metabolic Panel:  Recent Labs Lab 06/22/13 0235 06/24/13 0630 06/25/13 0625  NA 130* 132* 128*  K 3.9 4.2 4.0  CL 90* 95* 92*  CO2 25 26 26   GLUCOSE 104* 88 93  BUN 34* 30* 23  CREATININE 1.60* 1.48* 1.39*  CALCIUM 9.7 9.1 9.0  MG 2.3  --   --    Liver Function Tests:  Recent Labs Lab 06/22/13 0235  AST 20  ALT 20  ALKPHOS 73  BILITOT 0.4  PROT 7.8  ALBUMIN 3.7    Recent Labs Lab 06/22/13 0235  LIPASE 24   No results found for this basename: AMMONIA,  in the last 168 hours CBC:  Recent Labs Lab 06/22/13 0235 06/24/13 0630  WBC 8.8 9.3  NEUTROABS 6.6  --   HGB 10.1* 9.8*  HCT 29.6* 28.8*  MCV 90.2 91.1  PLT 289 252   Cardiac Enzymes:  Recent Labs Lab 06/22/13 0653 06/22/13 1428 06/22/13 2006  TROPONINI <0.30 <0.30 <0.30   BNP: BNP (last 3 results)  Recent Labs  05/28/13 0103 06/22/13 0235 06/25/13 0625  PROBNP 7588.0* 3043.0* 3081.0*   CBG:  Recent Labs Lab 06/22/13 0126  GLUCAP 110*       Signed:  Marinda Elk  [facility] 06/25/2013, 11:18 AM

## 2013-06-25 NOTE — Progress Notes (Signed)
Patient's telemetry has been removed per order; will continue to monitor patient. Lorretta Harp RN

## 2013-06-25 NOTE — Progress Notes (Signed)
Patient's IV and telemetry is discontinued, patient verbalizes understanding of discharge instructions and is being discharged home with wife. Patient is currently leaving unit. Lorretta Harp RN

## 2013-06-28 LAB — CULTURE, BLOOD (ROUTINE X 2): Culture: NO GROWTH

## 2013-06-29 ENCOUNTER — Telehealth: Payer: Self-pay | Admitting: *Deleted

## 2013-06-29 ENCOUNTER — Encounter: Payer: Self-pay | Admitting: Vascular Surgery

## 2013-06-29 LAB — CULTURE, BLOOD (ROUTINE X 2): Culture: NO GROWTH

## 2013-06-29 NOTE — Telephone Encounter (Signed)
[name] , PT with [facility] called stating patient has refused physical therapy with the last discharge from hospital.  He stated that he is "getting along just fine".

## 2013-06-30 ENCOUNTER — Other Ambulatory Visit: Payer: Self-pay | Admitting: Cardiovascular Disease

## 2013-06-30 ENCOUNTER — Ambulatory Visit (INDEPENDENT_AMBULATORY_CARE_PROVIDER_SITE_OTHER): Payer: Medicare Other | Admitting: Cardiovascular Disease

## 2013-06-30 ENCOUNTER — Encounter: Payer: Self-pay | Admitting: Cardiovascular Disease

## 2013-06-30 ENCOUNTER — Encounter: Payer: Self-pay | Admitting: Vascular Surgery

## 2013-06-30 ENCOUNTER — Ambulatory Visit (INDEPENDENT_AMBULATORY_CARE_PROVIDER_SITE_OTHER): Payer: Self-pay | Admitting: Vascular Surgery

## 2013-06-30 VITALS — BP 116/63 | HR 61 | Ht 68.0 in | Wt 183.0 lb

## 2013-06-30 VITALS — BP 133/65 | HR 64 | Temp 97.7°F | Resp 16 | Ht 69.0 in | Wt 184.0 lb

## 2013-06-30 DIAGNOSIS — I70219 Atherosclerosis of native arteries of extremities with intermittent claudication, unspecified extremity: Secondary | ICD-10-CM | POA: Insufficient documentation

## 2013-06-30 DIAGNOSIS — I509 Heart failure, unspecified: Secondary | ICD-10-CM

## 2013-06-30 DIAGNOSIS — I739 Peripheral vascular disease, unspecified: Secondary | ICD-10-CM

## 2013-06-30 DIAGNOSIS — I2510 Atherosclerotic heart disease of native coronary artery without angina pectoris: Secondary | ICD-10-CM

## 2013-06-30 DIAGNOSIS — I5023 Acute on chronic systolic (congestive) heart failure: Secondary | ICD-10-CM

## 2013-06-30 DIAGNOSIS — I4891 Unspecified atrial fibrillation: Secondary | ICD-10-CM

## 2013-06-30 DIAGNOSIS — Z48812 Encounter for surgical aftercare following surgery on the circulatory system: Secondary | ICD-10-CM

## 2013-06-30 MED ORDER — CIPROFLOXACIN HCL 500 MG PO TABS: 500.0000 mg | ORAL_TABLET | Freq: Two times a day (BID) | ORAL | Status: DC

## 2013-06-30 NOTE — Progress Notes (Signed)
Subjective:     Patient ID: [name], male   DOB: [birth year], 67 y.o.   MRN: [id number]  HPI this 67 year old male returns for continued followup regarding his left inguinal 1. He has a VAC in place which is being changed on Monday Wednesday and Friday. Patient had extensive left external iliac and femoral and arterectomy performed for rest ischemia left foot which is functioning nicely. He has had no chills and fever. He does take Cipro 300 mg twice a day  Review of Systems     Objective:   Physical Exam BP 133/65  Pulse 64  Temp(Src) 97.7 F (36.5 C) (Oral)  Resp 16  Ht 5\' 9"  (1.753 m)  Wt 184 lb (83.462 kg)  BMI 27.16 kg/m2  SpO2 99%  Gen. chronically ill male in no apparent stress alert and oriented x3 Left inguinal 1 was examined. A VAC dressing was removed. There is some fat necrosis in the superior aspect which was debrided. Otherwise the wound is granulating nicely. There is no Dacron graft exposed. Left foot is adequately perfused.      Assessment:     Left inguinal wound healing satisfactorily with VAC dressing changes Monday Wednesday Friday Continue Cipro twice a day until wound is healed Given refill for oxycodone 10/325 #40 tablets    Plan:     Return in 2 weeks for continued followup

## 2013-06-30 NOTE — Assessment & Plan Note (Signed)
He remains in sinus rhythm. There is some concern that he is not tolerating the amiodarone. We'll discontinue the amiodarone at this time. We'll continue with his medications otherwise.

## 2013-06-30 NOTE — Assessment & Plan Note (Signed)
   Mr. Macneal Seems to be doing fairly well. His heart failure symptoms seem to be well compensated.  His biggest problem is that of nausea and vomiting. He was found to have some gastric erosions but they're still some concern that the amiodarone is causing the nausea. He's had this nausea since June.  He has maintained sinus rhythm. We will discontinue the amiodarone at this point and continue to follow him.

## 2013-06-30 NOTE — Patient Instructions (Addendum)
Your physician has recommended you make the following change in your medication:  STOP AMIODARONE   Your physician wants you to follow-up in: 4 MONTHS WITH DR Elease Hashimoto You will receive a reminder letter in the mail two months in advance. If you don't receive a letter, please call our office to schedule the follow-up appointment.
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
## 2013-07-13 ENCOUNTER — Encounter: Payer: Self-pay | Admitting: Vascular Surgery

## 2013-07-14 ENCOUNTER — Ambulatory Visit: Payer: Medicare Other | Admitting: Vascular Surgery

## 2013-07-14 ENCOUNTER — Encounter: Payer: Self-pay | Admitting: Internal Medicine

## 2013-07-14 ENCOUNTER — Encounter: Payer: Medicare Other | Admitting: Vascular Surgery

## 2013-07-14 ENCOUNTER — Ambulatory Visit (INDEPENDENT_AMBULATORY_CARE_PROVIDER_SITE_OTHER): Payer: Medicare Other | Admitting: Internal Medicine

## 2013-07-14 ENCOUNTER — Ambulatory Visit (INDEPENDENT_AMBULATORY_CARE_PROVIDER_SITE_OTHER): Payer: Self-pay | Admitting: Vascular Surgery

## 2013-07-14 VITALS — BP 120/60 | HR 65 | Temp 98.0°F | Resp 16 | Wt 183.0 lb

## 2013-07-14 VITALS — BP 122/62 | HR 70 | Temp 99.1°F | Wt 181.5 lb

## 2013-07-14 DIAGNOSIS — Z5189 Encounter for other specified aftercare: Secondary | ICD-10-CM

## 2013-07-14 DIAGNOSIS — K2970 Gastritis, unspecified, without bleeding: Secondary | ICD-10-CM

## 2013-07-14 DIAGNOSIS — R112 Nausea with vomiting, unspecified: Secondary | ICD-10-CM

## 2013-07-14 MED ORDER — ONDANSETRON 8 MG PO TBDP: 8.0000 mg | ORAL_TABLET | Freq: Three times a day (TID) | ORAL | Status: DC | PRN

## 2013-07-14 NOTE — Progress Notes (Signed)
Subjective:    Patient ID: [name], male    DOB: 1946-04-16, 67 y.o.   MRN: [id number]  HPI  Pt presents to the clinic today with c/o nausea and vomiting. This started over the weekend. He has also had fever. He has had intermittent issues with nausea and vomiting since July. At that time, he was seen by cardiology and started on 7 new medications at that time. He has been back to cardiology with the same complaint. They d/c'd his amiodarone thinking that may be the culprit but it has not helped. He throws up almost anything he eats or drinks. Sometimes it is just dry heaving. He has taken phenergan but is not able to keep the pills down. He is wondering if there is anything else that can be done for the nausea.  Review of Systems  Past Medical History  Diagnosis Date  . Hyperlipidemia   . Hypertension   . PVD (peripheral vascular disease)     prior extensive endarterectomy of the right external iliac, common femoral bypass, prior R CEA. R iliac disease noted during 03/2013 cath.  . Ischemic leg 03-08-2008    with extensive endarterectomy  . Chronic systolic CHF (congestive heart failure)     a. EF 35% by cath March 08, 2012. b. EF 25-30% by echo 03/2013.  Marland Kitchen Anemia     Instructed 03/08/2012 to f/u with PCP  . Nocturnal oxygen desaturation 08-Mar-2012  . COPD (chronic obstructive pulmonary disease)     a. Suspected during admit 03/2013.  Marland Kitchen Atrial fibrillation     a. Transient during 03/2013 admission, started on apixaban/amiodarone.  . Anginal pain   . Ischemic cardiomyopathy   . CKD (chronic kidney disease), stage II   . PAD (peripheral artery disease)   . Pneumonia     "haven't had it since I was in my late March 09, 2023" (06/09/2013)  . Rheumatic fever ~ 1947  . Exertional shortness of breath   . Peptic ulcer 1960's    "tx'd w/RX; gone in ~ 3 months; didn't come back" (06/09/2013)  . Arthritis     "spine, wrist" (06/09/2013)  . Depression     "hx" (06/09/2013)  . Basal cell carcinoma of face Mar 08, 2010  . Coronary  artery disease     a. anterior MI in 1988-03-08. b. MI with CABG in 03/08/97. c. Small NSTEMI s/p DES to SVG-OM Mar 08, 2012, newly recognized LV dysfunction at that time. d. NSTEMI 03/2013: occlusion of OM branch after touchdown of SVG, likely the culprit, too small for PCI, for med rx.  . Myocardial infarction 06/01/19892013/06/01; 03/2013    "might have been some small ones inbetween; those were the most serious" (06/09/2013)  . Anterior myocardial infarction 1988/03/08  . NSTEMI (non-ST elevated myocardial infarction) 03-08-2012  . STEMI (ST elevation myocardial infarction) 03/2013  . Complication of anesthesia     Lungs filled up with fluid when he had "gas anesthesia." (07-14-2013)  . Anxiety     "hx" (2013/07/14)  . Migraines     "stopped after sudden death episode in 03-08-88" (07/14/2013)  . Stroke Mar 08, 2008    "had temporary blindness", denies residual on 2013/07/14    Current Outpatient Prescriptions  Medication Sig Dispense Refill  . albuterol (PROVENTIL HFA;VENTOLIN HFA) 108 (90 BASE) MCG/ACT inhaler Inhale 2 puffs into the lungs every 6 (six) hours as needed for wheezing or shortness of breath.  1 Inhaler  1  . amLODipine (NORVASC) 5 MG tablet Take 1 tablet (5 mg total) by mouth  daily.  90 tablet  3  . apixaban (ELIQUIS) 5 MG TABS tablet Take 1 tablet (5 mg total) by mouth 2 (two) times daily.  60 tablet  3  . aspirin 81 MG tablet Take 1 tablet (81 mg total) by mouth daily.  30 tablet  0  . atorvastatin (LIPITOR) 80 MG tablet Take 1 tablet (80 mg total) by mouth every evening.  90 tablet  3  . carvedilol (COREG) 6.25 MG tablet Take 1 tablet (6.25 mg total) by mouth 2 (two) times daily with a meal.  60 tablet  0  . ciprofloxacin (CIPRO) 500 MG tablet Take 1 tablet (500 mg total) by mouth 2 (two) times daily.  28 tablet  1  . clobetasol (OLUX) 0.05 % topical foam Apply topically 2 (two) times daily.  100 g  0  . enalapril (VASOTEC) 5 MG tablet Take 5 mg by mouth 2 (two) times daily.      . furosemide (LASIX) 40 MG tablet TAKE 1  TABLET (40 MG TOTAL) BY MOUTH DAILY.  30 tablet  3  . HYDROcodone-acetaminophen (NORCO) 10-325 MG per tablet Take 1 tablet by mouth every 6 (six) hours as needed for pain.      . isosorbide mononitrate (IMDUR) 30 MG 24 hr tablet Take 30 mg by mouth daily.      Marland Kitchen loratadine (CLARITIN) 10 MG tablet Take 10 mg by mouth every evening.      . metoCLOPramide (REGLAN) 5 MG tablet Take 1 tablet (5 mg total) by mouth 4 (four) times daily.  40 tablet  0  . nitroGLYCERIN (NITROSTAT) 0.4 MG SL tablet Place 1 tablet (0.4 mg total) under the tongue every 5 (five) minutes as needed for chest pain.  25 tablet  prn  . oxyCODONE (OXY IR/ROXICODONE) 5 MG immediate release tablet Take 1-2 tablets (5-10 mg total) by mouth every 6 (six) hours as needed for pain.  30 tablet  0  . pantoprazole (PROTONIX) 40 MG tablet Take 1 tablet (40 mg total) by mouth 2 (two) times daily.  60 tablet  0  . promethazine (PHENERGAN) 25 MG tablet Take 1 tablet (25 mg total) by mouth every 6 (six) hours as needed for nausea.  30 tablet  0  . spironolactone (ALDACTONE) 25 MG tablet Take 1 tablet (25 mg total) by mouth daily.  30 tablet  0  . tamsulosin (FLOMAX) 0.4 MG CAPS capsule Take 1 capsule (0.4 mg total) by mouth daily.  30 capsule  2   No current facility-administered medications for this visit.    No Known Allergies  Family History  Problem Relation Age of Onset  . Lung disease Mother   . Osteoporosis Mother   . Hypertension Mother   . Deep vein thrombosis Mother   . Lung cancer Father   . Cancer Father   . Heart disease Father   . Hyperlipidemia Father   . Hypertension Father   . Heart attack Father   . ALS Brother   . Heart disease Brother     Heart Disease before age 2 and  AAA  . Hyperlipidemia Brother   . Hypertension Brother   . Heart attack Brother   . Hypertension Sister     History   Social History  . Marital Status: Divorced    Spouse Name: N/A    Number of Children: N/A  . Years of Education: N/A    Occupational History  . Not on file.   Social History Main Topics  .  Smoking status: Current Every Day Smoker -- 0.12 packs/day for 57 years    Types: Cigarettes    Last Attempt to Quit: 06/21/2013  . Smokeless tobacco: Never Used     Comment: 06/22/2013 "I've tried patches, Wellbutrin; electronic cigarettes help more than anything"  . Alcohol Use: No  . Drug Use: No  . Sexual Activity: Not Currently   Other Topics Concern  . Not on file   Social History Narrative   Smoking history smokes 3 cigarettes daily. Smoking since age 26    Denies etOH   Married to a lady from the [employer]   Total 4 biological kids; 1 murdered 3 living    From [place] originally    Did factory work    No relationship with living kids      Constitutional: Denies fever, malaise, fatigue, headache or abrupt weight changes.  Respiratory: Denies difficulty breathing, shortness of breath, cough or sputum production.   Cardiovascular: Denies chest pain, chest tightness, palpitations or swelling in the hands or feet.  Gastrointestinal: Pt reports nausea and vomiting. Denies abdominal pain, bloating, constipation, diarrhea or blood in the stool.    No other specific complaints in a complete review of systems (except as listed in HPI above).     Objective:   Physical Exam   BP 122/62  Pulse 70  Temp(Src) 99.1 F (37.3 C) (Oral)  Wt 181 lb 8 oz (82.328 kg)  BMI 27.6 kg/m2  SpO2 95% Wt Readings from Last 3 Encounters:  07/14/13 181 lb 8 oz (82.328 kg)  07/14/13 183 lb (83.008 kg)  06/30/13 183 lb (83.008 kg)    General: Appears his stated age, well developed, well nourished in NAD. Cardiovascular: Normal rate and rhythm. S1,S2 noted.  No murmur, rubs or gallops noted. No JVD or BLE edema. No carotid bruits noted. Pulmonary/Chest: Normal effort and positive vesicular breath sounds. No respiratory distress. No wheezes, rales or ronchi noted.  Abdomen: Soft and nontender. Normal bowel sounds,  no bruits noted. No distention or masses noted. Liver, spleen and kidneys non palpable.   BMET    Component Value Date/Time   NA 128* 06/25/2013 0625   K 4.0 06/25/2013 0625   CL 92* 06/25/2013 0625   CO2 26 06/25/2013 0625   GLUCOSE 93 06/25/2013 0625   BUN 23 06/25/2013 0625   CREATININE 1.39* 06/25/2013 0625   CALCIUM 9.0 06/25/2013 0625   GFRNONAA 50* 06/25/2013 0625   GFRAA 58* 06/25/2013 0625    Lipid Panel     Component Value Date/Time   CHOL 121 05/07/2013 1012   TRIG 132.0 05/07/2013 1012   HDL 27.60* 05/07/2013 1012   CHOLHDL 4 05/07/2013 1012   VLDL 26.4 05/07/2013 1012   LDLCALC 67 05/07/2013 1012    CBC    Component Value Date/Time   WBC 9.3 06/24/2013 0630   RBC 3.16* 06/24/2013 0630   RBC 2.83* 05/26/2013 0500   HGB 9.8* 06/24/2013 0630   HCT 28.8* 06/24/2013 0630   PLT 252 06/24/2013 0630   MCV 91.1 06/24/2013 0630   MCH 31.0 06/24/2013 0630   MCHC 34.0 06/24/2013 0630   RDW 13.8 06/24/2013 0630   LYMPHSABS 1.0 06/22/2013 0235   MONOABS 0.7 06/22/2013 0235   EOSABS 0.6 06/22/2013 0235   BASOSABS 0.0 06/22/2013 0235    Hgb A1C Lab Results  Component Value Date   HGBA1C 7.2* 03/21/2013        Assessment & Plan:   Nausea and vomiting, secondary to gastritis (?  Medication related):  Drink plenty of fluids to avoid dehydration eRx for Zofran ODT to see if this helps with nausea If still unable to tolerate, will switch to Phenergan suppositories  RTC as needed or if symptoms persist or worsen

## 2013-07-14 NOTE — Progress Notes (Signed)
HPI: Pt presents to the office today with complaints of nausea and vomiting. Symptoms started on Feb 22, 2023 and have continued over the weekend. He has struggled with similar episodes since July. He was hospitalized in August for erosive gastritis and was told that it was possibly a new medication he started. Patient states he was prescribed 7 new medications by his cardiologist in July and prior to he did not have any problems with nausea or vomiting. The patient states he is trying to drink as much as possible but some days he vomits up to 5-6 times a day. He denies abdominal pain, diarrhea, or blood in stool or vomit.   Past Medical History  Diagnosis Date  . Hyperlipidemia   . Hypertension   . PVD (peripheral vascular disease)     prior extensive endarterectomy of the right external iliac, common femoral bypass, prior R CEA. R iliac disease noted during 03/2013 cath.  . Ischemic leg 22-Feb-2008    with extensive endarterectomy  . Chronic systolic CHF (congestive heart failure)     a. EF 35% by cath 02-22-12. b. EF 25-30% by echo 03/2013.  Marland Kitchen Anemia     Instructed 2012-02-22 to f/u with PCP  . Nocturnal oxygen desaturation Feb 22, 2012  . COPD (chronic obstructive pulmonary disease)     a. Suspected during admit 03/2013.  Marland Kitchen Atrial fibrillation     a. Transient during 03/2013 admission, started on apixaban/amiodarone.  . Anginal pain   . Ischemic cardiomyopathy   . CKD (chronic kidney disease), stage II   . PAD (peripheral artery disease)   . Pneumonia     "haven't had it since I was in my late 02/22/2023" (06/09/2013)  . Rheumatic fever ~ 1947  . Exertional shortness of breath   . Peptic ulcer 1960's    "tx'd w/RX; gone in ~ 3 months; didn't come back" (06/09/2013)  . Arthritis     "spine, wrist" (06/09/2013)  . Depression     "hx" (06/09/2013)  . Basal cell carcinoma of face Feb 21, 2010  . Coronary artery disease     a. anterior MI in 1988/02/22. b. MI with CABG in 02/21/1997. c. Small NSTEMI s/p DES to SVG-OM 22-Feb-2012, newly recognized LV  dysfunction at that time. d. NSTEMI 03/2013: occlusion of OM branch after touchdown of SVG, likely the culprit, too small for PCI, for med rx.  . Myocardial infarction 05/17/198905-17-2013; 03/2013    "might have been some small ones inbetween; those were the most serious" (06/09/2013)  . Anterior myocardial infarction 02-22-1988  . NSTEMI (non-ST elevated myocardial infarction) 02-22-2012  . STEMI (ST elevation myocardial infarction) 03/2013  . Complication of anesthesia     Lungs filled up with fluid when he had "gas anesthesia." (2013-06-29)  . Anxiety     "hx" (June 29, 2013)  . Migraines     "stopped after sudden death episode in 22-Feb-1988" (2013/06/29)  . Stroke 02/22/2008    "had temporary blindness", denies residual on 06/29/13    Current Outpatient Prescriptions  Medication Sig Dispense Refill  . albuterol (PROVENTIL HFA;VENTOLIN HFA) 108 (90 BASE) MCG/ACT inhaler Inhale 2 puffs into the lungs every 6 (six) hours as needed for wheezing or shortness of breath.  1 Inhaler  1  . amLODipine (NORVASC) 5 MG tablet Take 1 tablet (5 mg total) by mouth daily.  90 tablet  3  . apixaban (ELIQUIS) 5 MG TABS tablet Take 1 tablet (5 mg total) by mouth 2 (two) times daily.  60 tablet  3  .  aspirin 81 MG tablet Take 1 tablet (81 mg total) by mouth daily.  30 tablet  0  . atorvastatin (LIPITOR) 80 MG tablet Take 1 tablet (80 mg total) by mouth every evening.  90 tablet  3  . carvedilol (COREG) 6.25 MG tablet Take 1 tablet (6.25 mg total) by mouth 2 (two) times daily with a meal.  60 tablet  0  . ciprofloxacin (CIPRO) 500 MG tablet Take 1 tablet (500 mg total) by mouth 2 (two) times daily.  28 tablet  1  . clobetasol (OLUX) 0.05 % topical foam Apply topically 2 (two) times daily.  100 g  0  . enalapril (VASOTEC) 5 MG tablet Take 5 mg by mouth 2 (two) times daily.      . furosemide (LASIX) 40 MG tablet TAKE 1 TABLET (40 MG TOTAL) BY MOUTH DAILY.  30 tablet  3  . HYDROcodone-acetaminophen (NORCO) 10-325 MG per tablet Take 1 tablet by mouth  every 6 (six) hours as needed for pain.      . isosorbide mononitrate (IMDUR) 30 MG 24 hr tablet Take 30 mg by mouth daily.      Marland Kitchen loratadine (CLARITIN) 10 MG tablet Take 10 mg by mouth every evening.      . metoCLOPramide (REGLAN) 5 MG tablet Take 1 tablet (5 mg total) by mouth 4 (four) times daily.  40 tablet  0  . nitroGLYCERIN (NITROSTAT) 0.4 MG SL tablet Place 1 tablet (0.4 mg total) under the tongue every 5 (five) minutes as needed for chest pain.  25 tablet  prn  . oxyCODONE (OXY IR/ROXICODONE) 5 MG immediate release tablet Take 1-2 tablets (5-10 mg total) by mouth every 6 (six) hours as needed for pain.  30 tablet  0  . pantoprazole (PROTONIX) 40 MG tablet Take 1 tablet (40 mg total) by mouth 2 (two) times daily.  60 tablet  0  . promethazine (PHENERGAN) 25 MG tablet Take 1 tablet (25 mg total) by mouth every 6 (six) hours as needed for nausea.  30 tablet  0  . spironolactone (ALDACTONE) 25 MG tablet Take 1 tablet (25 mg total) by mouth daily.  30 tablet  0  . tamsulosin (FLOMAX) 0.4 MG CAPS capsule Take 1 capsule (0.4 mg total) by mouth daily.  30 capsule  2   No current facility-administered medications for this visit.    No Known Allergies   ROS:  Constitutional: Denies fever, malaise, fatigue, headache or abrupt weight changes.  Respiratory: Denies difficulty breathing, shortness of breath, cough or sputum production.   Cardiovascular: Denies chest pain, chest tightness, palpitations or swelling in the hands or feet.  Gastrointestinal: Endorses nausea and vomiting. Denies abdominal pain, bloating, constipation, diarrhea or blood in the stool.  GU: Denies frequency, urgency, pain with urination, blood in urine, odor or discharge.   No other specific complaints in a complete review of systems (except as listed in HPI above).  PE:  There were no vitals taken for this visit. Wt Readings from Last 3 Encounters:  07/14/13 183 lb (83.008 kg)  06/30/13 183 lb (83.008 kg)   06/30/13 184 lb (83.462 kg)    General: Appears their stated age, well developed, well nourished in NAD. Cardiovascular: Normal rate and rhythm. S1,S2 noted.  No murmur, rubs or gallops noted. No JVD or BLE edema. No carotid bruits noted. Pulmonary/Chest: Normal effort and positive vesicular breath sounds. No respiratory distress. No wheezes, rales or ronchi noted.  Abdomen: Soft and nontender. Normal bowel sounds,  no bruits noted. No distention or masses noted. Liver, spleen and kidneys non palpable. Psychiatric: Mood and affect normal. Behavior is normal. Judgment and thought content normal.   Assessment and Plan: Nausea/Vomiting Prescribed Zofran 8mg  disintegrating tablet by mouth every 8 hours as needed for nausea/vomiting Increase fluid intake; try Gatorade Make an appointment with Cardiology about medication concerns Follow up in 3-5 days if symptoms worsen or do not improve  Sumayya Muha, Jacques Earthly, Student-NP

## 2013-07-14 NOTE — Patient Instructions (Signed)

## 2013-07-14 NOTE — Progress Notes (Signed)
Subjective:     Patient ID: [name], male   DOB: 12-Mar-1946, 67 y.o.   MRN: [id number]  HPI this 67 year old male returns for continued followup regarding his left inguinal wound. He is currently having a VAC dressing change Monday Wednesday and Friday. He has had no chills and fever. He is on Cipro twice daily.  Review of Systems     Objective:   Physical Exam There were no vitals taken for this visit.  Left inguinal wound examined after removal of VAC. Very good granulation tissue on both medial and lateral walls. No graft exposed. No cellulitis.     Assessment:     Slowly healing left inguinal wound with VAC dressing change Monday Wednesday and Friday    Plan:     Return in 4 weeks for continued followup Continue Cipro daily Given prescription for oxycodone 10/325 mg-#40

## 2013-07-17 ENCOUNTER — Encounter: Payer: Self-pay | Admitting: Vascular Surgery

## 2013-07-23 ENCOUNTER — Other Ambulatory Visit: Payer: Self-pay | Admitting: Cardiovascular Disease

## 2013-07-23 MED ORDER — CARVEDILOL 6.25 MG PO TABS: 6.2500 mg | ORAL_TABLET | Freq: Two times a day (BID) | ORAL | Status: DC

## 2013-07-23 NOTE — Addendum Note (Signed)
Addended by: Carmela Hurt on: 07/23/2013 05:47 PM   Modules accepted: Orders

## 2013-08-10 ENCOUNTER — Encounter: Payer: Self-pay | Admitting: Vascular Surgery

## 2013-08-11 ENCOUNTER — Ambulatory Visit (INDEPENDENT_AMBULATORY_CARE_PROVIDER_SITE_OTHER): Payer: Self-pay | Admitting: Vascular Surgery

## 2013-08-11 ENCOUNTER — Encounter: Payer: Self-pay | Admitting: Nurse Practitioner

## 2013-08-11 ENCOUNTER — Ambulatory Visit (INDEPENDENT_AMBULATORY_CARE_PROVIDER_SITE_OTHER): Payer: Medicare Other | Admitting: Nurse Practitioner

## 2013-08-11 ENCOUNTER — Encounter: Payer: Self-pay | Admitting: Vascular Surgery

## 2013-08-11 VITALS — BP 104/52 | HR 52 | Ht 68.0 in | Wt 175.0 lb

## 2013-08-11 VITALS — BP 121/69 | HR 69 | Resp 18 | Ht 68.5 in | Wt 175.0 lb

## 2013-08-11 DIAGNOSIS — R634 Abnormal weight loss: Secondary | ICD-10-CM

## 2013-08-11 DIAGNOSIS — I739 Peripheral vascular disease, unspecified: Secondary | ICD-10-CM

## 2013-08-11 DIAGNOSIS — R110 Nausea: Secondary | ICD-10-CM

## 2013-08-11 LAB — HEPATIC FUNCTION PANEL
ALT: 10 U/L (ref 0–53)
AST: 16 U/L (ref 0–37)
Albumin: 3.9 g/dL (ref 3.5–5.2)
Alkaline Phosphatase: 47 U/L (ref 39–117)
Bilirubin, Direct: 0.1 mg/dL (ref 0.0–0.3)
Total Bilirubin: 0.7 mg/dL (ref 0.3–1.2)
Total Protein: 6.8 g/dL (ref 6.0–8.3)

## 2013-08-11 LAB — BASIC METABOLIC PANEL
BUN: 24 mg/dL — ABNORMAL HIGH (ref 6–23)
CO2: 26 mEq/L (ref 19–32)
Calcium: 9.4 mg/dL (ref 8.4–10.5)
Chloride: 96 mEq/L (ref 96–112)
Creatinine, Ser: 1.6 mg/dL — ABNORMAL HIGH (ref 0.4–1.5)
GFR: 45.41 mL/min — ABNORMAL LOW (ref >60.00)
Glucose, Bld: 98 mg/dL (ref 70–99)
Potassium: 4.4 mEq/L (ref 3.5–5.1)
Sodium: 133 mEq/L — ABNORMAL LOW (ref 135–145)

## 2013-08-11 LAB — URINALYSIS, ROUTINE W REFLEX MICROSCOPIC
Bilirubin Urine: NEGATIVE
Hgb urine dipstick: NEGATIVE
Ketones, ur: NEGATIVE
Leukocytes, UA: NEGATIVE
Nitrite: NEGATIVE
RBC / HPF: NONE SEEN (ref 0–?)
Specific Gravity, Urine: >=1.03 (ref 1.000–1.030)
Total Protein, Urine: 30
Urine Glucose: NEGATIVE
Urobilinogen, UA: 0.2 (ref 0.0–1.0)
pH: 5 (ref 5.0–8.0)

## 2013-08-11 LAB — CBC WITH DIFFERENTIAL/PLATELET
Basophils Absolute: 0 10*3/uL (ref 0.0–0.1)
Basophils Relative: 0.3 % (ref 0.0–3.0)
Eosinophils Absolute: 0.4 10*3/uL (ref 0.0–0.7)
Eosinophils Relative: 3.2 % (ref 0.0–5.0)
HCT: 31.9 % — ABNORMAL LOW (ref 39.0–52.0)
Hemoglobin: 10.6 g/dL — ABNORMAL LOW (ref 13.0–17.0)
Lymphocytes Relative: 8.2 % — ABNORMAL LOW (ref 12.0–46.0)
Lymphs Abs: 1.2 10*3/uL (ref 0.7–4.0)
MCHC: 33.3 g/dL (ref 30.0–36.0)
MCV: 90.7 fl (ref 78.0–100.0)
Monocytes Absolute: 1.2 10*3/uL — ABNORMAL HIGH (ref 0.1–1.0)
Monocytes Relative: 8.6 % (ref 3.0–12.0)
Neutro Abs: 11.2 10*3/uL — ABNORMAL HIGH (ref 1.4–7.7)
Neutrophils Relative %: 79.7 % — ABNORMAL HIGH (ref 43.0–77.0)
Platelets: 194 10*3/uL (ref 150.0–400.0)
RBC: 3.52 Mil/uL — ABNORMAL LOW (ref 4.22–5.81)
RDW: 14.7 % — ABNORMAL HIGH (ref 11.5–14.6)
WBC: 14 10*3/uL — ABNORMAL HIGH (ref 4.5–10.5)

## 2013-08-11 LAB — LIPASE: Lipase: 24 U/L (ref 11.0–59.0)

## 2013-08-11 LAB — AMYLASE: Amylase: 47 U/L (ref 27–131)

## 2013-08-11 MED ORDER — SPIRONOLACTONE 25 MG PO TABS: 12.5000 mg | ORAL_TABLET | Freq: Every day | ORAL | Status: AC

## 2013-08-11 MED ORDER — PANTOPRAZOLE SODIUM 40 MG PO TBEC: 40.0000 mg | DELAYED_RELEASE_TABLET | Freq: Every day | ORAL | Status: DC

## 2013-08-11 NOTE — Progress Notes (Signed)
Subjective:     Patient ID: [name], male   DOB: [birth year], 67 y.o.   MRN: [id number]  HPI this 67 year old male returns for continued followup regarding his left inguinal wound following an external iliac to profunda femoris bypass which was performed in August of this year. He has been having a VAC changed Monday Wednesday and Friday. He has had no chills and fever. He also reports a "sore" in the lateral aspect of his left leg.  Review of Systems     Objective:   Physical Exam BP 121/69  Pulse 69  Resp 18  Ht 5' 8.5" (1.74 m)  Wt 175 lb (79.379 kg)  BMI 26.22 kg/m2  General well-developed well-nourished male in no apparent distress Left inguinal wound has healed very nicely with a VAC. There remains a 2.5 x 2.5 cm of just beefy granulation tissue but no deep wound. Graft is well covered. He has a 2-3+ femoral pulse palpable in brisk monophasic flow in the posterior tib and anterior tib at the ankle level. There is an ulcerated area in the lateral aspect of his lower leg measuring 2.5 x 2 cm where he scratched a blistered area about a month ago.      Assessment:     #1 nicely healing left inguinal wound having received VAC dressings Monday Wednesday and Friday #2 2.5 x 2 cm wound in the lateral aspect left lower leg were patient scratched a scabbed area-not infected    Plan:     #1 DC VAC for left inguinal wound and begin moist to dry dressing changes twice daily #2 begin moist to dry dressing changes left lateral lower leg #3 will return in 4 weeks or sooner if lateral leg ulcer is worsening and he may need to be referred to wound center

## 2013-08-11 NOTE — Patient Instructions (Signed)
We need to check labs today  Stop your Norvasc  Start Protonix  Stop Eliquis  Cut the Aldactone back to just a half a tablet ONCE a day  I need to see you on Friday at 10 am  Call the [facility] office at [phone] if you have any questions, problems or concerns.
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
## 2013-08-13 ENCOUNTER — Telehealth: Payer: Self-pay | Admitting: Nurse Practitioner

## 2013-08-13 NOTE — Telephone Encounter (Signed)
New Problem:  Pt states he is calling to hear his results test results. Please advise

## 2013-08-13 NOTE — Telephone Encounter (Signed)
S/w pt aware of results and to monitor temp stated home health nurse monitors temp every time she comes out and temp is staying the same aware pt has appt tomorrow with Sunday Spillers

## 2013-08-14 ENCOUNTER — Encounter: Payer: Self-pay | Admitting: Nurse Practitioner

## 2013-08-14 ENCOUNTER — Ambulatory Visit (INDEPENDENT_AMBULATORY_CARE_PROVIDER_SITE_OTHER): Payer: Medicare Other | Admitting: Nurse Practitioner

## 2013-08-14 VITALS — BP 128/74 | HR 68 | Ht 68.0 in | Wt 181.4 lb

## 2013-08-14 DIAGNOSIS — I2589 Other forms of chronic ischemic heart disease: Secondary | ICD-10-CM

## 2013-08-14 DIAGNOSIS — I255 Ischemic cardiomyopathy: Secondary | ICD-10-CM

## 2013-08-14 MED ORDER — RIVAROXABAN 15 MG PO TABS: 15.0000 mg | ORAL_TABLET | Freq: Every day | ORAL | Status: DC
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
## 2013-08-14 NOTE — Patient Instructions (Addendum)
Call Dr. Hart Rochester and tell him your leg is getting worse.   Let's try the Xarelto - 15mg  to take with supper  Call me if you start feeling bad  I will see you in a month  Call the [facility] office at [phone] if you have any questions, problems or concerns.

## 2013-08-17 ENCOUNTER — Ambulatory Visit (INDEPENDENT_AMBULATORY_CARE_PROVIDER_SITE_OTHER): Payer: Medicare Other | Admitting: Nurse Practitioner

## 2013-08-17 ENCOUNTER — Telehealth: Payer: Self-pay | Admitting: Cardiovascular Disease

## 2013-08-17 VITALS — BP 130/62 | HR 67 | Temp 97.8°F | Resp 16 | Wt 177.8 lb

## 2013-08-17 DIAGNOSIS — R040 Epistaxis: Secondary | ICD-10-CM

## 2013-08-17 NOTE — Patient Instructions (Signed)
Stop xarelto. Call [name] & let her know you having nose bleeds. Do not restart until you hear from her office. Continue your other medicines. Start sinus rinse to clear blood clots (Neilmed Sinus Rinse). Use in R side of nose to gently clean the left side. Use once daily until consistently runs clear. It can take up to 15 minutes of pressure to stop a nose bleed. If it recurs and does not stop after 15 minutes, please go to urgent care or ER.  Nosebleed Nosebleeds can be caused by many conditions including trauma, infections, polyps, foreign bodies, dry mucous membranes or climate, medications and air conditioning. Most nosebleeds occur in the front of the nose. It is because of this location that most nosebleeds can be controlled by pinching the nostrils gently and continuously. Do this for at least 10 to 20 minutes. The reason for this long continuous pressure is that you must hold it long enough for the blood to clot. If during that 10 to 20 minute time period, pressure is released, the process may have to be started again. The nosebleed may stop by itself, quit with pressure, need concentrated heating (cautery) or stop with pressure from packing. HOME CARE INSTRUCTIONS   If your nose was packed, try to maintain the pack inside until your caregiver removes it. If a gauze pack was used and it starts to fall out, gently replace or cut the end off. Do not cut if a balloon catheter was used to pack the nose. Otherwise, do not remove unless instructed.  Avoid blowing your nose for 12 hours after treatment. This could dislodge the pack or clot and start bleeding again.  If the bleeding starts again, sit up and bending forward, gently pinch the front half of your nose continuously for 20 minutes.  If bleeding was caused by dry mucous membranes, cover the inside of your nose every morning with a petroleum or antibiotic ointment. Use your little fingertip as an applicator. Do this as needed during dry  weather. This will keep the mucous membranes moist and allow them to heal.  Maintain humidity in your home by using less air conditioning or using a humidifier.  Do not use aspirin or medications which make bleeding more likely. Your caregiver can give you recommendations on this.  Resume normal activities as able but try to avoid straining, lifting or bending at the waist for several days.  If the nosebleeds become recurrent and the cause is unknown, your caregiver may suggest laboratory tests. SEEK IMMEDIATE MEDICAL CARE IF:   Bleeding recurs and cannot be controlled.  There is unusual bleeding from or bruising on other parts of the body.  You have a fever.  Nosebleeds continue.  There is any worsening of the condition which originally brought you in.  You become lightheaded, feel faint, become sweaty or vomit blood. MAKE SURE YOU:   Understand these instructions.  Will watch your condition.  Will get help right away if you are not doing well or get worse. Document Released: 07/04/2005 Document Revised: 12/17/2011 Document Reviewed: 08/26/2009 [facility] Patient Information 2014 [facility] [place], [place].

## 2013-08-17 NOTE — Progress Notes (Signed)
  Subjective:    Patient ID: [name], male    DOB: 03/05/1946, 67 y.o.   MRN: [id number]  Epistaxis  The bleeding has been from the left nare. This is a new problem. The current episode started today. The problem occurs every few hours. The problem has been waxing and waning. The bleeding is associated with anticoagulants (startes xarelto 3 d ago). He has tried nothing for the symptoms.      Review of Systems  Constitutional: Negative for fever, chills, activity change, appetite change and fatigue.  HENT: Positive for nosebleeds. Negative for congestion, ear discharge, ear pain, sinus pressure and sore throat.   Skin: Negative for pallor.  Neurological: Negative for dizziness and headaches.  Hematological: Negative for adenopathy.       Objective:   Physical Exam  Constitutional: He is oriented to person, place, and time. He appears well-developed and well-nourished. No distress.  HENT:  Head: Normocephalic and atraumatic.  Right Ear: External ear normal.  Left Ear: External ear normal.  Mouth/Throat: No oropharyngeal exudate.  Bright red blood in L nare. Minimal bleeding in office. Less after holding pressure for several minutes. Several spots blood on tissue. No bleeding vessel visualized.  Eyes: Conjunctivae are normal. Right eye exhibits no discharge. Left eye exhibits no discharge.  Neck: No thyromegaly present.  Cardiovascular: Normal rate, regular rhythm and normal heart sounds.   No murmur heard. Pulmonary/Chest: Effort normal and breath sounds normal. No respiratory distress. He has no wheezes.  Lymphadenopathy:    He has no cervical adenopathy.  Neurological: He is alert and oriented to person, place, and time.  Skin: Skin is warm and dry.  Psychiatric: He has a normal mood and affect. His behavior is normal. Thought content normal.          Assessment & Plan:  1. Epistaxis Minimal amount of bleeding in office, scant blood on tissue, unable to visualize  bleeding vessel. Think this is r/t having started xarelto 3d ago. Advise stop xarelto, call cardiology. See pt instructions.

## 2013-08-17 NOTE — Telephone Encounter (Signed)
New message  Patient is taking [name] and is having nose bleeds, He would like a call back.

## 2013-08-17 NOTE — Telephone Encounter (Signed)
PT WAS SITTING AT TABLE AND HE FELT A TRICKLE AND HAS HAD A NOSE BLEED SINCE 9:30 AM. TOLD HIM TO GO TO PCP OR URGENT CARE. PT AGREED TO PLAN, TOLD PT CONTINUE XARELTO. TOLD PT TO CALL us WITH UPDATE POST OV

## 2013-08-17 NOTE — Progress Notes (Signed)
Pre visit review using our clinic review tool, if applicable. No additional management support is needed unless otherwise documented below in the visit note. 

## 2013-08-27 ENCOUNTER — Encounter: Payer: Self-pay | Admitting: *Deleted

## 2013-08-27 NOTE — Telephone Encounter (Signed)
This encounter was created in error - please disregard.

## 2013-09-07 ENCOUNTER — Encounter: Payer: Self-pay | Admitting: Vascular Surgery

## 2013-09-08 ENCOUNTER — Ambulatory Visit (INDEPENDENT_AMBULATORY_CARE_PROVIDER_SITE_OTHER): Payer: Medicare Other | Admitting: Vascular Surgery

## 2013-09-08 ENCOUNTER — Encounter: Payer: Self-pay | Admitting: Vascular Surgery

## 2013-09-08 VITALS — BP 137/84 | HR 66 | Resp 18 | Ht 68.5 in | Wt 183.0 lb

## 2013-09-08 DIAGNOSIS — I739 Peripheral vascular disease, unspecified: Secondary | ICD-10-CM

## 2013-09-08 DIAGNOSIS — Z48812 Encounter for surgical aftercare following surgery on the circulatory system: Secondary | ICD-10-CM

## 2013-09-08 NOTE — Progress Notes (Signed)
Subjective:     Patient ID: [name], male   DOB: [birth year], 67 y.o.   MRN: [id number]  HPI this 67 year old male returns for continued followup regarding his left inguinal wound. He had extensive left femoral artery resection performed in August of this year with interposition Dacron graft placed to his profunda femoris artery. He has had prolonged healing of the left inguinal wound which is now completely healed. He also developed a sore on the lateral aspect of his left lower leg which has improved with moist dressing changes over the past 4 weeks. He said the chills and fever appear   Review of Systems     Objective:   Physical Exam BP 137/84  Pulse 66  Resp 18  Ht 5' 8.5" (1.74 m)  Wt 183 lb (83.008 kg)  BMI 27.42 kg/m2  General well-developed well-nourished male no apparent stress alert and oriented x3 Left inguinal wound has completely healed. There is a small 5 mm extrusion of granulation tissue which I treated with silver nitrate stick. Lateral lower leg wound is now 1-1/2 cm in diameter and is clean and uninfected. Left foot well perfused     Assessment:     Left inguinal wound now healed following extensive left femoral resection with interposition graft in August of 2014    Plan:     Return in 3 months a duplex scan of left femoral artery and check ABIs

## 2013-09-08 NOTE — Addendum Note (Signed)
Addended by: Adria Dill L on: 09/08/2013 02:23 PM   Modules accepted: Orders

## 2013-09-11 ENCOUNTER — Encounter: Payer: Self-pay | Admitting: Nurse Practitioner

## 2013-09-11 ENCOUNTER — Ambulatory Visit (INDEPENDENT_AMBULATORY_CARE_PROVIDER_SITE_OTHER): Payer: Medicare Other | Admitting: Nurse Practitioner

## 2013-09-11 VITALS — BP 140/62 | HR 78 | Ht 68.5 in | Wt 182.6 lb

## 2013-09-11 DIAGNOSIS — Z23 Encounter for immunization: Secondary | ICD-10-CM

## 2013-09-11 DIAGNOSIS — I255 Ischemic cardiomyopathy: Secondary | ICD-10-CM

## 2013-09-11 DIAGNOSIS — I2589 Other forms of chronic ischemic heart disease: Secondary | ICD-10-CM

## 2013-09-11 LAB — CBC WITH DIFFERENTIAL/PLATELET
Basophils Absolute: 0 10*3/uL (ref 0.0–0.1)
Basophils Relative: 0.3 % (ref 0.0–3.0)
Eosinophils Absolute: 0.8 10*3/uL — ABNORMAL HIGH (ref 0.0–0.7)
Eosinophils Relative: 11.5 % — ABNORMAL HIGH (ref 0.0–5.0)
HCT: 31.4 % — ABNORMAL LOW (ref 39.0–52.0)
Hemoglobin: 10.4 g/dL — ABNORMAL LOW (ref 13.0–17.0)
Lymphocytes Relative: 15.3 % (ref 12.0–46.0)
Lymphs Abs: 1 10*3/uL (ref 0.7–4.0)
MCHC: 33.2 g/dL (ref 30.0–36.0)
MCV: 89.4 fl (ref 78.0–100.0)
Monocytes Absolute: 0.9 10*3/uL (ref 0.1–1.0)
Monocytes Relative: 12.8 % — ABNORMAL HIGH (ref 3.0–12.0)
Neutro Abs: 4 10*3/uL (ref 1.4–7.7)
Neutrophils Relative %: 60.1 % (ref 43.0–77.0)
Platelets: 202 10*3/uL (ref 150.0–400.0)
RBC: 3.51 Mil/uL — ABNORMAL LOW (ref 4.22–5.81)
RDW: 15.1 % — ABNORMAL HIGH (ref 11.5–14.6)
WBC: 6.6 10*3/uL (ref 4.5–10.5)

## 2013-09-11 LAB — BASIC METABOLIC PANEL
BUN: 17 mg/dL (ref 6–23)
CO2: 26 mEq/L (ref 19–32)
Calcium: 8.9 mg/dL (ref 8.4–10.5)
Chloride: 99 mEq/L (ref 96–112)
Creatinine, Ser: 1.3 mg/dL (ref 0.4–1.5)
GFR: 60.24 mL/min (ref >60.00)
Glucose, Bld: 90 mg/dL (ref 70–99)
Potassium: 3.4 mEq/L — ABNORMAL LOW (ref 3.5–5.1)
Sodium: 134 mEq/L — ABNORMAL LOW (ref 135–145)

## 2013-09-11 NOTE — Patient Instructions (Signed)
I will see you in 3 months  We need to check labs today  Stay on your current medicines.  Call the [facility] office at [phone] if you have any questions, problems or concerns.  ect.

## 2013-09-11 NOTE — Addendum Note (Signed)
Addended by: Jama Flavors on: 09/11/2013 12:11 PM   Modules accepted: Orders
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
## 2013-09-14 ENCOUNTER — Other Ambulatory Visit: Payer: Self-pay | Admitting: *Deleted

## 2013-09-14 ENCOUNTER — Other Ambulatory Visit: Payer: Self-pay

## 2013-09-14 DIAGNOSIS — E876 Hypokalemia: Secondary | ICD-10-CM

## 2013-09-14 MED ORDER — ISOSORBIDE MONONITRATE ER 30 MG PO TB24: ORAL_TABLET | ORAL | Status: DC

## 2013-09-14 MED ORDER — CARVEDILOL 12.5 MG PO TABS: 12.5000 mg | ORAL_TABLET | Freq: Two times a day (BID) | ORAL | Status: AC

## 2013-09-28 ENCOUNTER — Other Ambulatory Visit (INDEPENDENT_AMBULATORY_CARE_PROVIDER_SITE_OTHER): Payer: Medicare Other

## 2013-09-28 DIAGNOSIS — E876 Hypokalemia: Secondary | ICD-10-CM

## 2013-09-28 LAB — BASIC METABOLIC PANEL
BUN: 13 mg/dL (ref 6–23)
CO2: 29 mEq/L (ref 19–32)
Calcium: 9 mg/dL (ref 8.4–10.5)
Chloride: 100 mEq/L (ref 96–112)
Creatinine, Ser: 1.2 mg/dL (ref 0.4–1.5)
GFR: 63.12 mL/min (ref >60.00)
Glucose, Bld: 105 mg/dL — ABNORMAL HIGH (ref 70–99)
Potassium: 4 mEq/L (ref 3.5–5.1)
Sodium: 139 mEq/L (ref 135–145)

## 2013-09-28 LAB — CBC WITH DIFFERENTIAL/PLATELET
Basophils Absolute: 0 10*3/uL (ref 0.0–0.1)
Basophils Relative: 0.7 % (ref 0.0–3.0)
Eosinophils Absolute: 0.8 10*3/uL — ABNORMAL HIGH (ref 0.0–0.7)
Eosinophils Relative: 14.6 % — ABNORMAL HIGH (ref 0.0–5.0)
HCT: 32.4 % — ABNORMAL LOW (ref 39.0–52.0)
Hemoglobin: 10.6 g/dL — ABNORMAL LOW (ref 13.0–17.0)
Lymphocytes Relative: 20.2 % (ref 12.0–46.0)
Lymphs Abs: 1.1 10*3/uL (ref 0.7–4.0)
MCHC: 32.6 g/dL (ref 30.0–36.0)
MCV: 88.6 fl (ref 78.0–100.0)
Monocytes Absolute: 0.5 10*3/uL (ref 0.1–1.0)
Monocytes Relative: 9.2 % (ref 3.0–12.0)
Neutro Abs: 2.9 10*3/uL (ref 1.4–7.7)
Neutrophils Relative %: 55.3 % (ref 43.0–77.0)
Platelets: 250 10*3/uL (ref 150.0–400.0)
RBC: 3.65 Mil/uL — ABNORMAL LOW (ref 4.22–5.81)
RDW: 15.1 % — ABNORMAL HIGH (ref 11.5–14.6)
WBC: 5.3 10*3/uL (ref 4.5–10.5)

## 2013-10-31 ENCOUNTER — Other Ambulatory Visit: Payer: Self-pay | Admitting: Cardiovascular Disease

## 2013-12-11 ENCOUNTER — Ambulatory Visit (INDEPENDENT_AMBULATORY_CARE_PROVIDER_SITE_OTHER): Payer: Medicare HMO | Admitting: Nurse Practitioner

## 2013-12-11 ENCOUNTER — Encounter: Payer: Self-pay | Admitting: Nurse Practitioner

## 2013-12-11 VITALS — BP 136/70 | HR 64 | Ht 68.0 in | Wt 201.0 lb

## 2013-12-11 DIAGNOSIS — R0600 Dyspnea, unspecified: Secondary | ICD-10-CM

## 2013-12-11 DIAGNOSIS — I2589 Other forms of chronic ischemic heart disease: Secondary | ICD-10-CM

## 2013-12-11 DIAGNOSIS — I255 Ischemic cardiomyopathy: Secondary | ICD-10-CM

## 2013-12-11 DIAGNOSIS — R0989 Other specified symptoms and signs involving the circulatory and respiratory systems: Secondary | ICD-10-CM

## 2013-12-11 DIAGNOSIS — R0609 Other forms of dyspnea: Secondary | ICD-10-CM

## 2013-12-11 LAB — CBC
HCT: 32.3 % — ABNORMAL LOW (ref 39.0–52.0)
Hemoglobin: 10.6 g/dL — ABNORMAL LOW (ref 13.0–17.0)
MCHC: 32.9 g/dL (ref 30.0–36.0)
MCV: 89.5 fl (ref 78.0–100.0)
Platelets: 172 10*3/uL (ref 150.0–400.0)
RBC: 3.6 Mil/uL — ABNORMAL LOW (ref 4.22–5.81)
RDW: 16.1 % — ABNORMAL HIGH (ref 11.5–14.6)
WBC: 7.9 10*3/uL (ref 4.5–10.5)

## 2013-12-11 LAB — BASIC METABOLIC PANEL
BUN: 21 mg/dL (ref 6–23)
CO2: 29 mEq/L (ref 19–32)
Calcium: 9 mg/dL (ref 8.4–10.5)
Chloride: 101 mEq/L (ref 96–112)
Creatinine, Ser: 1.4 mg/dL (ref 0.4–1.5)
GFR: 54.66 mL/min — ABNORMAL LOW (ref >60.00)
Glucose, Bld: 119 mg/dL — ABNORMAL HIGH (ref 70–99)
Potassium: 3.8 mEq/L (ref 3.5–5.1)
Sodium: 136 mEq/L (ref 135–145)

## 2013-12-11 LAB — BRAIN NATRIURETIC PEPTIDE: Pro B Natriuretic peptide (BNP): 557 pg/mL — ABNORMAL HIGH (ref 0.0–100.0)

## 2013-12-11 MED ORDER — ALBUTEROL SULFATE HFA 108 (90 BASE) MCG/ACT IN AERS: 2.0000 | INHALATION_SPRAY | Freq: Four times a day (QID) | RESPIRATORY_TRACT | Status: AC | PRN

## 2013-12-11 MED ORDER — ISOSORBIDE MONONITRATE ER 60 MG PO TB24: 60.0000 mg | ORAL_TABLET | Freq: Every day | ORAL | Status: AC
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
## 2013-12-11 NOTE — Patient Instructions (Addendum)
We need to check labs today  We need to get another ultrasound of your heart - echocardiogram  I need to see you back a few days after the echocardiogram to discuss  Stay on your current medicines but I am increasing the Isosorbide to 60 mg each day - you can take 2 of your 30 mg tabs - use those up - and the RX for the 60 mg tab is at your drug store.  I refilled your albuterol   Call the [place] office at [phone] if you have any questions, problems or concerns.

## 2013-12-14 ENCOUNTER — Encounter: Payer: Self-pay | Admitting: Vascular Surgery

## 2013-12-15 ENCOUNTER — Ambulatory Visit (INDEPENDENT_AMBULATORY_CARE_PROVIDER_SITE_OTHER): Payer: Medicare HMO | Admitting: Family

## 2013-12-15 ENCOUNTER — Encounter: Payer: Self-pay | Admitting: Family

## 2013-12-15 ENCOUNTER — Ambulatory Visit (HOSPITAL_COMMUNITY)
Admission: RE | Admit: 2013-12-15 | Discharge: 2013-12-15 | Disposition: A | Discharge status: Home / Self Care | Payer: Medicare HMO | Source: Ambulatory Visit | Attending: Vascular Surgery | Admitting: Vascular Surgery

## 2013-12-15 ENCOUNTER — Ambulatory Visit (INDEPENDENT_AMBULATORY_CARE_PROVIDER_SITE_OTHER)
Admission: RE | Admit: 2013-12-15 | Discharge: 2013-12-15 | Disposition: A | Discharge status: Home / Self Care | Payer: Medicare HMO | Source: Ambulatory Visit | Attending: Vascular Surgery | Admitting: Vascular Surgery

## 2013-12-15 VITALS — BP 116/68 | HR 58 | Resp 16 | Ht 68.5 in | Wt 199.0 lb

## 2013-12-15 DIAGNOSIS — Z48812 Encounter for surgical aftercare following surgery on the circulatory system: Secondary | ICD-10-CM

## 2013-12-15 DIAGNOSIS — I739 Peripheral vascular disease, unspecified: Secondary | ICD-10-CM
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
## 2013-12-15 NOTE — Patient Instructions (Addendum)
Peripheral Vascular Disease Peripheral Vascular Disease (PVD), also called Peripheral Arterial Disease (PAD), is a circulation problem caused by cholesterol (atherosclerotic plaque) deposits in the arteries. PVD commonly occurs in the lower extremities (legs) but it can occur in other areas of the body, such as your arms. The cholesterol buildup in the arteries reduces blood flow which can cause pain and other serious problems. The presence of PVD can place a person at risk for Coronary Artery Disease (CAD).  CAUSES  Causes of PVD can be many. It is usually associated with more than one risk factor such as:   High Cholesterol.  Smoking.  Diabetes.  Lack of exercise or inactivity.  High blood pressure (hypertension).  Obesity.  Family history. SYMPTOMS   When the lower extremities are affected, patients with PVD may experience:  Leg pain with exertion or physical activity. This is called INTERMITTENT CLAUDICATION. This may present as cramping or numbness with physical activity. The location of the pain is associated with the level of blockage. For example, blockage at the abdominal level (distal abdominal aorta) may result in buttock or hip pain. Lower leg arterial blockage may result in calf pain.  As PVD becomes more severe, pain can develop with less physical activity.  In people with severe PVD, leg pain may occur at rest.  Other PVD signs and symptoms:  Leg numbness or weakness.  Coldness in the affected leg or foot, especially when compared to the other leg.  A change in leg color.  Patients with significant PVD are more prone to ulcers or sores on toes, feet or legs. These may take longer to heal or may reoccur. The ulcers or sores can become infected.  If signs and symptoms of PVD are ignored, gangrene may occur. This can result in the loss of toes or loss of an entire limb.  Not all leg pain is related to PVD. Other medical conditions can cause leg pain such  as:  Blood clots (embolism) or Deep Vein Thrombosis.  Inflammation of the blood vessels (vasculitis).  Spinal stenosis. DIAGNOSIS  Diagnosis of PVD can involve several different types of tests. These can include:  Pulse Volume Recording Method (PVR). This test is simple, painless and does not involve the use of X-rays. PVR involves measuring and comparing the blood pressure in the arms and legs. An ABI (Ankle-Brachial Index) is calculated. The normal ratio of blood pressures is 1. As this number becomes smaller, it indicates more severe disease.  < 0.95  indicates significant narrowing in one or more leg vessels.  <0.8 there will usually be pain in the foot, leg or buttock with exercise.  <0.4 will usually have pain in the legs at rest.  <0.25  usually indicates limb threatening PVD.  Doppler detection of pulses in the legs. This test is painless and checks to see if you have a pulses in your legs/feet.  A dye or contrast material (a substance that highlights the blood vessels so they show up on x-ray) may be given to help your caregiver better see the arteries for the following tests. The dye is eliminated from your body by the kidney's. Your caregiver may order blood work to check your kidney function and other laboratory values before the following tests are performed:  Magnetic Resonance Angiography (MRA). An MRA is a picture study of the blood vessels and arteries. The MRA machine uses a large magnet to produce images of the blood vessels.  Computed Tomography Angiography (CTA). A CTA is a   specialized x-ray that looks at how the blood flows in your blood vessels. An IV may be inserted into your arm so contrast dye can be injected.  Angiogram. Is a procedure that uses x-rays to look at your blood vessels. This procedure is minimally invasive, meaning a small incision (cut) is made in your groin. A small tube (catheter) is then inserted into the artery of your groin. The catheter is  guided to the blood vessel or artery your caregiver wants to examine. Contrast dye is injected into the catheter. X-rays are then taken of the blood vessel or artery. After the images are obtained, the catheter is taken out. TREATMENT  Treatment of PVD involves many interventions which may include:  Lifestyle changes:  Quitting smoking.  Exercise.  Following a low fat, low cholesterol diet.  Control of diabetes.  Foot care is very important to the PVD patient. Good foot care can help prevent infection.  Medication:  Cholesterol-lowering medicine.  Blood pressure medicine.  Anti-platelet drugs.  Certain medicines may reduce symptoms of Intermittent Claudication.  Interventional/Surgical options:  Angioplasty. An Angioplasty is a procedure that inflates a balloon in the blocked artery. This opens the blocked artery to improve blood flow.  Stent Implant. A wire mesh tube (stent) is placed in the artery. The stent expands and stays in place, allowing the artery to remain open.  Peripheral Bypass Surgery. This is a surgical procedure that reroutes the blood around a blocked artery to help improve blood flow. This type of procedure may be performed if Angioplasty or stent implants are not an option. SEEK IMMEDIATE MEDICAL CARE IF:   You develop pain or numbness in your arms or legs.  Your arm or leg turns cold, becomes blue in color.  You develop redness, warmth, swelling and pain in your arms or legs. MAKE SURE YOU:   Understand these instructions.  Will watch your condition.  Will get help right away if you are not doing well or get worse. Document Released: 11/01/2004 Document Revised: 12/17/2011 Document Reviewed: 09/28/2008 ExitCare Patient Information 2014 [employer].   Stroke Prevention Some medical conditions and behaviors are associated with an increased chance of having a stroke. You may prevent a stroke by making healthy choices and managing medical  conditions. HOW CAN I REDUCE MY RISK OF HAVING A STROKE?   Stay physically active. Get at least 30 minutes of activity on most or all days.  Do not smoke. It may also be helpful to avoid exposure to secondhand smoke.  Limit alcohol use. Moderate alcohol use is considered to be:  No more than 2 drinks per day for men.  No more than 1 drink per day for nonpregnant women.  Eat healthy foods. This involves  Eating 5 or more servings of fruits and vegetables a day.  Following a diet that addresses high blood pressure (hypertension), high cholesterol, diabetes, or obesity.  Manage your cholesterol levels.  A diet low in saturated fat, trans fat, and cholesterol and high in fiber may control cholesterol levels.  Take any prescribed medicines to control cholesterol as directed by your health care provider.  Manage your diabetes.  A controlled-carbohydrate, controlled-sugar diet is recommended to manage diabetes.  Take any prescribed medicines to control diabetes as directed by your health care provider.  Control your hypertension.  A low-salt (sodium), low-saturated fat, low-trans fat, and low-cholesterol diet is recommended to manage hypertension.  Take any prescribed medicines to control hypertension as directed by your health care provider.    Maintain a healthy weight.  A reduced-calorie, low-sodium, low-saturated fat, low-trans fat, low-cholesterol diet is recommended to manage weight.  Stop drug abuse.  Avoid taking birth control pills.  Talk to your health care provider about the risks of taking birth control pills if you are over 35 years old, smoke, get migraines, or have ever had a blood clot.  Get evaluated for sleep disorders (sleep apnea).  Talk to your health care provider about getting a sleep evaluation if you snore a lot or have excessive sleepiness.  Take medicines as directed by your health care provider.  For some people, aspirin or blood thinners  (anticoagulants) are helpful in reducing the risk of forming abnormal blood clots that can lead to stroke. If you have the irregular heart rhythm of atrial fibrillation, you should be on a blood thinner unless there is a good reason you cannot take them.  Understand all your medicine instructions.  Make sure that other other conditions (such as anemia or atherosclerosis) are addressed. SEEK IMMEDIATE MEDICAL CARE IF:   You have sudden weakness or numbness of the face, arm, or leg, especially on one side of the body.  Your face or eyelid droops to one side.  You have sudden confusion.  You have trouble speaking (aphasia) or understanding.  You have sudden trouble seeing in one or both eyes.  You have sudden trouble walking.  You have dizziness.  You have a loss of balance or coordination.  You have a sudden, severe headache with no known cause.  You have new chest pain or an irregular heartbeat. Any of these symptoms may represent a serious problem that is an emergency. Do not wait to see if the symptoms will go away. Get medical help at once. Call your local emergency services  (911 in [place] Do not drive yourself to the hospital. Document Released: 11/01/2004 Document Revised: 07/15/2013 Document Reviewed: 03/27/2013 ExitCare Patient Information 2014 [employer].  

## 2013-12-16 ENCOUNTER — Telehealth: Payer: Self-pay | Admitting: Cardiovascular Disease

## 2013-12-16 ENCOUNTER — Ambulatory Visit (HOSPITAL_COMMUNITY)
Admission: RE | Admit: 2013-12-16 | Discharge: 2013-12-16 | Disposition: A | Discharge status: Home / Self Care | Payer: Medicare HMO | Source: Ambulatory Visit | Attending: Cardiovascular Disease | Admitting: Cardiovascular Disease

## 2013-12-16 DIAGNOSIS — I255 Ischemic cardiomyopathy: Secondary | ICD-10-CM

## 2013-12-16 DIAGNOSIS — R0989 Other specified symptoms and signs involving the circulatory and respiratory systems: Secondary | ICD-10-CM | POA: Insufficient documentation

## 2013-12-16 DIAGNOSIS — R0609 Other forms of dyspnea: Secondary | ICD-10-CM | POA: Insufficient documentation

## 2013-12-16 DIAGNOSIS — I059 Rheumatic mitral valve disease, unspecified: Secondary | ICD-10-CM

## 2013-12-16 DIAGNOSIS — I2589 Other forms of chronic ischemic heart disease: Secondary | ICD-10-CM | POA: Insufficient documentation

## 2013-12-16 DIAGNOSIS — R0600 Dyspnea, unspecified: Secondary | ICD-10-CM

## 2013-12-16 NOTE — Telephone Encounter (Signed)
I will forward to Janan Halter Rn to review.

## 2013-12-16 NOTE — Progress Notes (Signed)
  Echocardiogram 2D Echocardiogram has been performed.  [name], [place] 12/16/2013, 10:40 AM

## 2013-12-16 NOTE — Telephone Encounter (Signed)
New message        [employer] would like to know if pt has an order for a lifevest

## 2013-12-18 ENCOUNTER — Ambulatory Visit (INDEPENDENT_AMBULATORY_CARE_PROVIDER_SITE_OTHER): Payer: Medicare HMO | Admitting: Nurse Practitioner

## 2013-12-18 ENCOUNTER — Encounter: Payer: Self-pay | Admitting: Nurse Practitioner

## 2013-12-18 VITALS — BP 130/60 | HR 50 | Ht 68.5 in | Wt 200.8 lb

## 2013-12-18 DIAGNOSIS — I25810 Atherosclerosis of coronary artery bypass graft(s) without angina pectoris: Secondary | ICD-10-CM

## 2013-12-18 DIAGNOSIS — I10 Essential (primary) hypertension: Secondary | ICD-10-CM

## 2013-12-18 DIAGNOSIS — I509 Heart failure, unspecified: Secondary | ICD-10-CM

## 2013-12-18 DIAGNOSIS — I5022 Chronic systolic (congestive) heart failure: Secondary | ICD-10-CM

## 2013-12-18 NOTE — Patient Instructions (Signed)
Stay on your current medicines  I will see you in 4 months  Keep restricting your salt  Talk to your wife about what we have discussed about putting in a "defibrillator" - let me know if you change your mind.  Call the [place] office at [phone] if you have any questions, problems or concerns.
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
## 2014-01-18 NOTE — Telephone Encounter (Signed)
Will  Have Dr Acie Fredrickson sign this week  Spoke to the patient and he does still wear and is due to see Cecille Rubin and is going to be referred to EP for an ICD,

## 2014-01-29 ENCOUNTER — Other Ambulatory Visit: Payer: Self-pay | Admitting: Cardiovascular Disease

## 2014-01-31 ENCOUNTER — Encounter (HOSPITAL_COMMUNITY): Payer: Self-pay | Admitting: Emergency Medicine

## 2014-01-31 ENCOUNTER — Emergency Department (HOSPITAL_COMMUNITY)
Admission: EM | Admit: 2014-01-31 | Discharge: 2014-02-05 | Disposition: E | Payer: Medicare HMO | Attending: Emergency Medicine | Admitting: Emergency Medicine

## 2014-01-31 DIAGNOSIS — Z7982 Long term (current) use of aspirin: Secondary | ICD-10-CM | POA: Insufficient documentation

## 2014-01-31 DIAGNOSIS — Z9861 Coronary angioplasty status: Secondary | ICD-10-CM | POA: Insufficient documentation

## 2014-01-31 DIAGNOSIS — I2510 Atherosclerotic heart disease of native coronary artery without angina pectoris: Secondary | ICD-10-CM | POA: Insufficient documentation

## 2014-01-31 DIAGNOSIS — Z8673 Personal history of transient ischemic attack (TIA), and cerebral infarction without residual deficits: Secondary | ICD-10-CM | POA: Insufficient documentation

## 2014-01-31 DIAGNOSIS — I129 Hypertensive chronic kidney disease with stage 1 through stage 4 chronic kidney disease, or unspecified chronic kidney disease: Secondary | ICD-10-CM | POA: Insufficient documentation

## 2014-01-31 DIAGNOSIS — I469 Cardiac arrest, cause unspecified: Secondary | ICD-10-CM

## 2014-01-31 DIAGNOSIS — I252 Old myocardial infarction: Secondary | ICD-10-CM | POA: Insufficient documentation

## 2014-01-31 DIAGNOSIS — J449 Chronic obstructive pulmonary disease, unspecified: Secondary | ICD-10-CM | POA: Insufficient documentation

## 2014-01-31 DIAGNOSIS — Z87891 Personal history of nicotine dependence: Secondary | ICD-10-CM | POA: Insufficient documentation

## 2014-01-31 DIAGNOSIS — M129 Arthropathy, unspecified: Secondary | ICD-10-CM | POA: Insufficient documentation

## 2014-01-31 DIAGNOSIS — Z85828 Personal history of other malignant neoplasm of skin: Secondary | ICD-10-CM | POA: Insufficient documentation

## 2014-01-31 DIAGNOSIS — I4891 Unspecified atrial fibrillation: Secondary | ICD-10-CM | POA: Insufficient documentation

## 2014-01-31 DIAGNOSIS — Z862 Personal history of diseases of the blood and blood-forming organs and certain disorders involving the immune mechanism: Secondary | ICD-10-CM | POA: Insufficient documentation

## 2014-01-31 DIAGNOSIS — I5022 Chronic systolic (congestive) heart failure: Secondary | ICD-10-CM | POA: Insufficient documentation

## 2014-01-31 DIAGNOSIS — M19039 Primary osteoarthritis, unspecified wrist: Secondary | ICD-10-CM | POA: Insufficient documentation

## 2014-01-31 DIAGNOSIS — J4489 Other specified chronic obstructive pulmonary disease: Secondary | ICD-10-CM | POA: Insufficient documentation

## 2014-01-31 DIAGNOSIS — Z951 Presence of aortocoronary bypass graft: Secondary | ICD-10-CM | POA: Insufficient documentation

## 2014-01-31 DIAGNOSIS — Z8711 Personal history of peptic ulcer disease: Secondary | ICD-10-CM | POA: Insufficient documentation

## 2014-01-31 DIAGNOSIS — N182 Chronic kidney disease, stage 2 (mild): Secondary | ICD-10-CM | POA: Insufficient documentation

## 2014-01-31 DIAGNOSIS — E669 Obesity, unspecified: Secondary | ICD-10-CM | POA: Insufficient documentation

## 2014-01-31 DIAGNOSIS — I209 Angina pectoris, unspecified: Secondary | ICD-10-CM | POA: Insufficient documentation

## 2014-01-31 DIAGNOSIS — Z8701 Personal history of pneumonia (recurrent): Secondary | ICD-10-CM | POA: Insufficient documentation

## 2014-01-31 DIAGNOSIS — E785 Hyperlipidemia, unspecified: Secondary | ICD-10-CM | POA: Insufficient documentation

## 2014-01-31 DIAGNOSIS — Z79899 Other long term (current) drug therapy: Secondary | ICD-10-CM | POA: Insufficient documentation

## 2014-01-31 DIAGNOSIS — Z8659 Personal history of other mental and behavioral disorders: Secondary | ICD-10-CM | POA: Insufficient documentation

## 2014-01-31 LAB — CBG MONITORING, ED: Glucose-Capillary: 380 mg/dL — ABNORMAL HIGH (ref 70–99)

## 2014-01-31 MED ORDER — EPINEPHRINE HCL 0.1 MG/ML IJ SOSY
PREFILLED_SYRINGE | INTRAMUSCULAR | Status: AC | PRN
  Administered 2014-01-31: 1 mg via INTRAVENOUS

## 2014-01-31 MED ORDER — SODIUM BICARBONATE 8.4 % IV SOLN
INTRAVENOUS | Status: AC | PRN
  Administered 2014-01-31: 50 meq via INTRAVENOUS

## 2014-01-31 MED ORDER — DEXTROSE 5 % IV SOLN
300.0000 mg | INTRAVENOUS | Status: AC | PRN
  Administered 2014-01-31: 300 mg via INTRAVENOUS

## 2014-01-31 MED ORDER — CALCIUM CHLORIDE 10 % IV SOLN
INTRAVENOUS | Status: AC | PRN
  Administered 2014-01-31: 1 g via INTRAVENOUS

## 2014-01-31 MED FILL — Medication: Qty: 1 | Status: AC

## 2014-02-01 ENCOUNTER — Telehealth (HOSPITAL_BASED_OUTPATIENT_CLINIC_OR_DEPARTMENT_OTHER): Payer: Self-pay

## 2014-02-05 NOTE — ED Notes (Signed)
Was advised family had arrived to see the body. Broke the seal on body bag to remove belongings and prep body for viewing. Removed eye prep bandages for family viewing and made family aware of patient belongings

## 2014-02-05 NOTE — Progress Notes (Signed)
Chaplain received referral from chaplain during shift change. Followed up with family in consultation room C. Provided emotional and grief support, pastoral presence. Escorted family from hospital when they were ready. They were grateful for chaplain support.    Ethelene Browns [phone]
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
## 2014-02-05 NOTE — ED Notes (Signed)
Family at bedside. 

## 2014-02-05 NOTE — ED Notes (Signed)
Secured patient belongings inside body bag, items included: Shirt, pants, 2 socks Small cooler full of patients medications Inside a sealed urine cup there is a gold ring with patient sticker on cup. Belonging bag, and body bag sealed at [phone]

## 2014-02-05 NOTE — Progress Notes (Signed)
Chaplain called for post cpr.  Family arrived an hour later and chaplain escorted them to consultation room before getting the doctor to notify them that the pt had passed.  After notification, chaplain offered pastoral and grief support.  Family is currently waiting on other family to arrive, which chaplain services will be available to assist them further if needed.

## 2014-02-05 DEATH — deceased

## 2014-04-16 ENCOUNTER — Ambulatory Visit: Payer: Medicare HMO | Admitting: Nurse Practitioner

## 2014-04-24 IMAGING — CR DG CHEST 1V PORT
1 series · 1 of 1 positions shown · non-contrast
Comparison: 07/07/2008

CLINICAL DATA: Chest pain.  Preop evaluation

PORTABLE CHEST - 1 VIEW

[view not recorded]
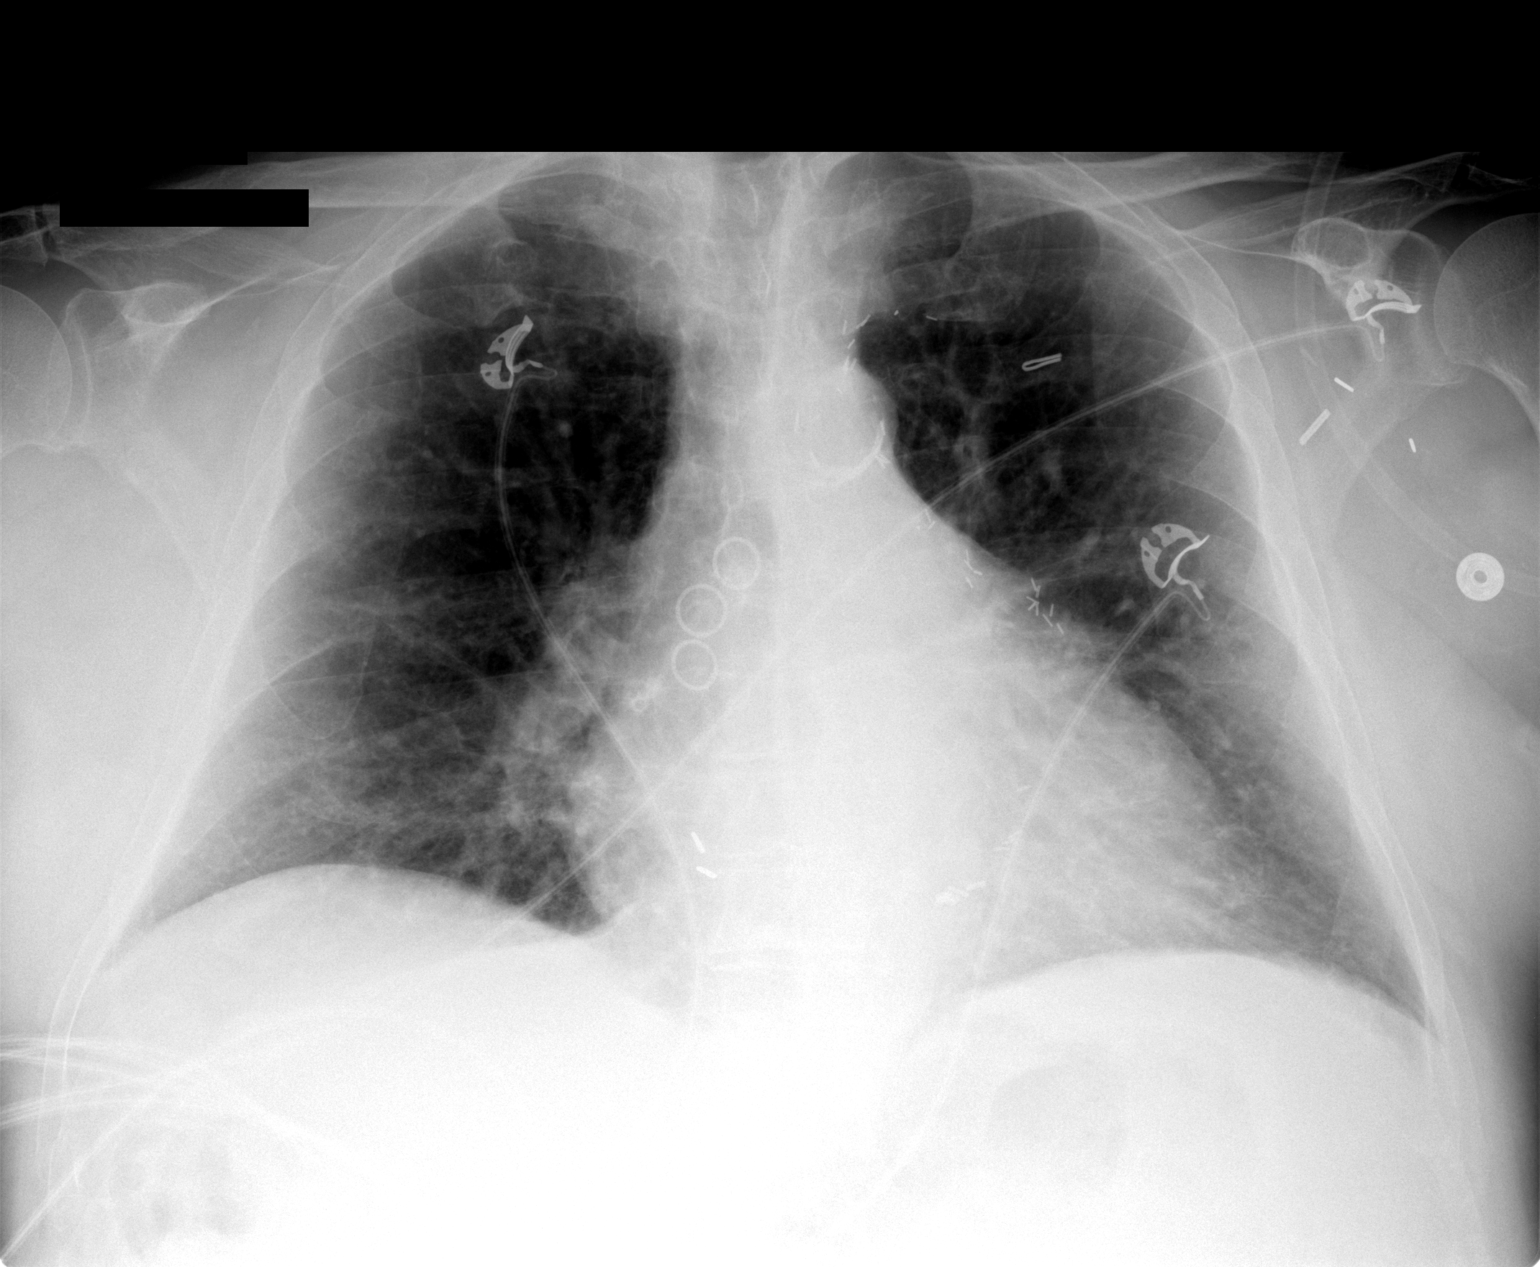

[1 of 1 positions shown; findings below may reference images not displayed]

FINDINGS: Cardiac enlargement.  Changes of CABG.  Negative for
heart failure.  Lungs are clear without infiltrate or mass lesion.
Probable COPD.
IMPRESSION: Cardiac enlargement without acute cardiopulmonary disease.

## 2014-06-16 ENCOUNTER — Ambulatory Visit: Payer: Medicare HMO | Admitting: Family

## 2014-06-16 ENCOUNTER — Encounter (HOSPITAL_COMMUNITY): Payer: Medicare HMO

## 2014-06-16 ENCOUNTER — Other Ambulatory Visit (HOSPITAL_COMMUNITY): Payer: Medicare HMO

## 2014-09-16 ENCOUNTER — Encounter (HOSPITAL_COMMUNITY): Payer: Self-pay | Admitting: Cardiology

## 2015-05-30 IMAGING — CR DG CHEST 1V PORT
1 series · 1 of 1 positions shown · non-contrast
Comparison: 03/22/2013; 02/18/2012; 07/07/2008

CLINICAL DATA: Evaluate pulmonary edema, persistent shortness of
breath

PORTABLE CHEST - 1 VIEW

[AP]
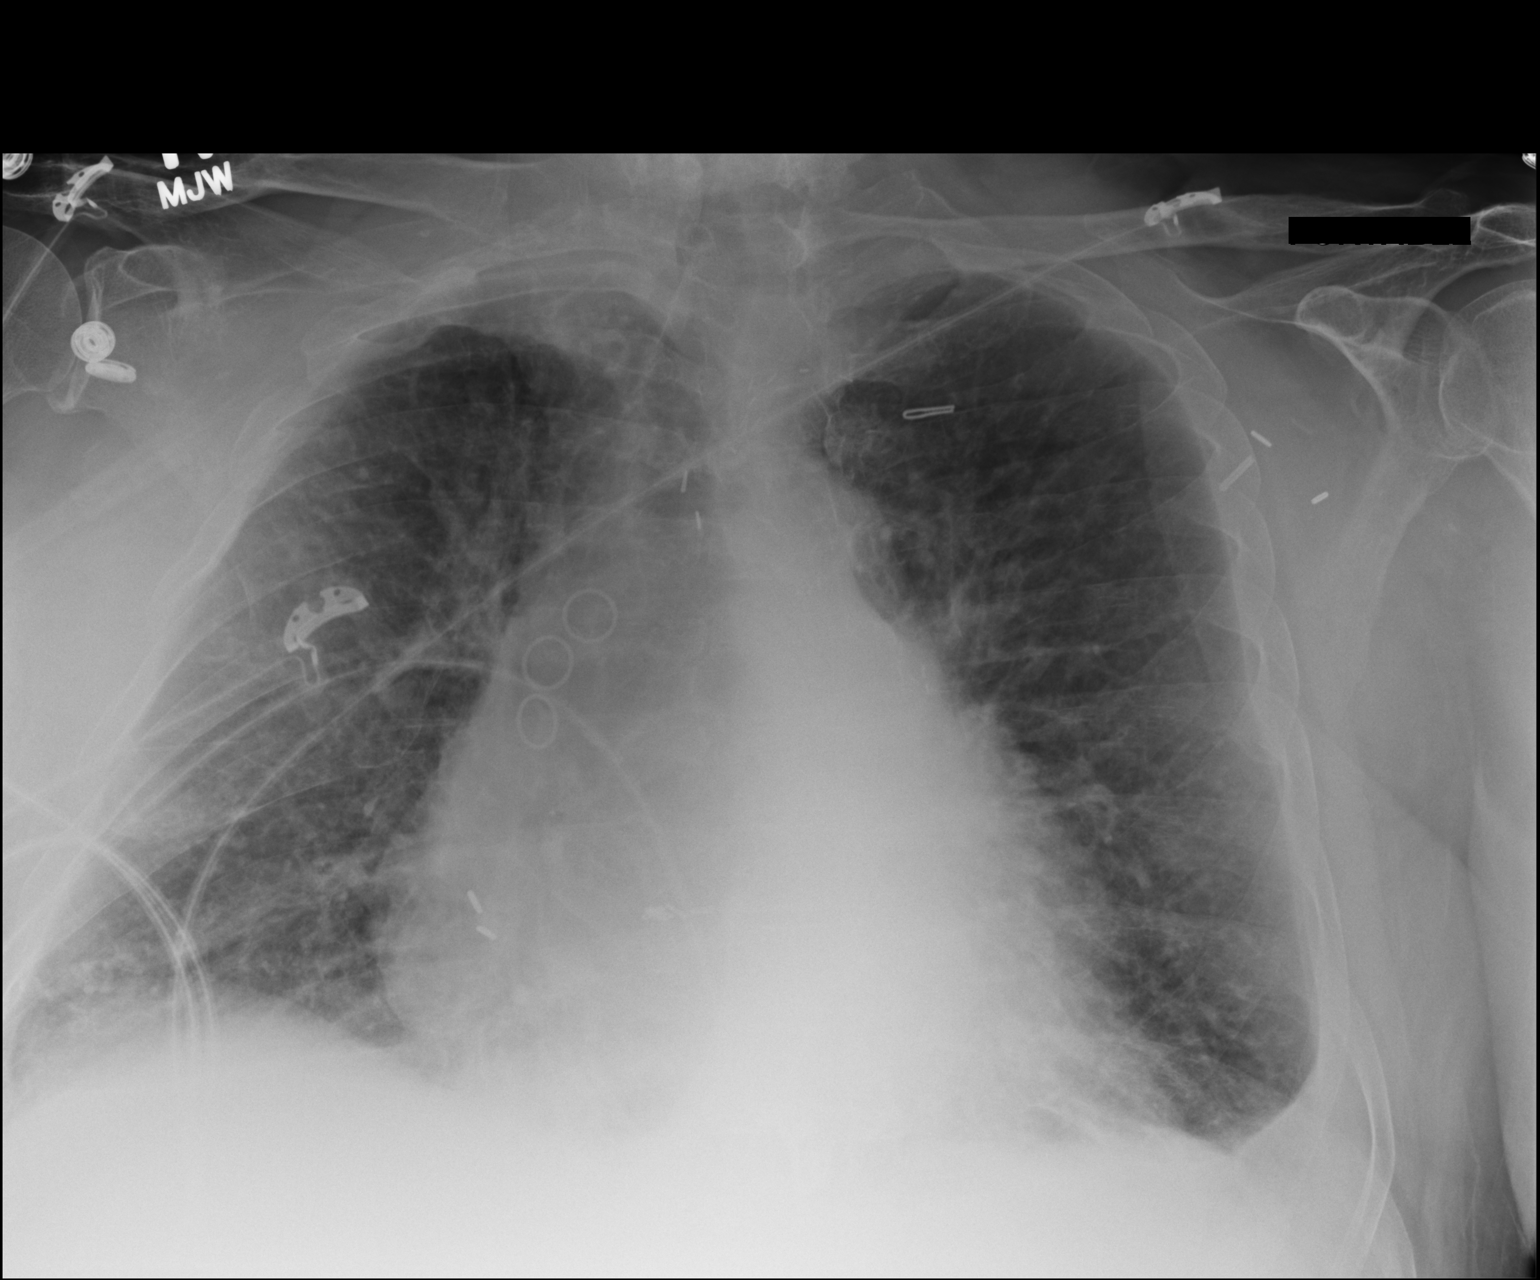

[1 of 1 positions shown; findings below may reference images not displayed]

FINDINGS: Grossly unchanged enlarged cardiac silhouette and
mediastinal contours given rotation.  Post CABG.  Overall improved
aeration of the lungs with persistent cephalization of flow.
Minimal residual right infrahilar heterogeneous opacities.
Interval development of small bilateral effusions, right greater
than left, with associated bibasilar opacities.  No pneumothorax.
Left axillary surgical clips.  Unchanged bones.
IMPRESSION: 1.  Improved pulmonary edema with persistent pulmonary venous
congestion.
2.  Development of small bilateral effusions with associated
bibasilar atelectasis.
